# Patient Record
Sex: Female | Born: 1960 | Race: White | Hispanic: No | Marital: Single | Smoking: Former smoker
Health system: Southern US, Community
[De-identification: ages and names within clinical notes are randomized; demographics above are authoritative.]

## PROBLEM LIST (undated history)

## (undated) DIAGNOSIS — F329 Major depressive disorder, single episode, unspecified: Secondary | ICD-10-CM

## (undated) DIAGNOSIS — J45909 Unspecified asthma, uncomplicated: Secondary | ICD-10-CM

## (undated) DIAGNOSIS — F419 Anxiety disorder, unspecified: Secondary | ICD-10-CM

## (undated) DIAGNOSIS — I7789 Other specified disorders of arteries and arterioles: Secondary | ICD-10-CM

## (undated) DIAGNOSIS — M1990 Unspecified osteoarthritis, unspecified site: Secondary | ICD-10-CM

## (undated) DIAGNOSIS — J449 Chronic obstructive pulmonary disease, unspecified: Secondary | ICD-10-CM

## (undated) DIAGNOSIS — K219 Gastro-esophageal reflux disease without esophagitis: Secondary | ICD-10-CM

## (undated) DIAGNOSIS — F32A Depression, unspecified: Secondary | ICD-10-CM

## (undated) HISTORY — DX: Major depressive disorder, single episode, unspecified: F32.9

## (undated) HISTORY — DX: Other specified disorders of arteries and arterioles: I77.89

## (undated) HISTORY — DX: Anxiety disorder, unspecified: F41.9

## (undated) HISTORY — DX: Unspecified osteoarthritis, unspecified site: M19.90

## (undated) HISTORY — DX: Depression, unspecified: F32.A

## (undated) HISTORY — DX: Chronic obstructive pulmonary disease, unspecified: J44.9

## (undated) HISTORY — DX: Gastro-esophageal reflux disease without esophagitis: K21.9

## (undated) HISTORY — PX: ABDOMINAL HYSTERECTOMY: SHX81

## (undated) HISTORY — DX: Unspecified asthma, uncomplicated: J45.909

## (undated) HISTORY — PX: TONSILLECTOMY AND ADENOIDECTOMY: SHX28

## (undated) NOTE — *Deleted (*Deleted)
Manual Therapy  STM to L sided thoracic to lumbar paraspinals: PT felt tightness and trigger points throughout lumbar and thoracic L sided paraspinals, decreased since last session. Pt responded very well to manual therapy and demonstrated improvements in pain and overall mobility.

---

## 2009-03-25 ENCOUNTER — Ambulatory Visit: Payer: Self-pay | Admitting: Family Medicine

## 2009-06-27 ENCOUNTER — Ambulatory Visit: Payer: Self-pay | Admitting: Family Medicine

## 2010-12-01 IMAGING — CR DG CHEST 2V
1 series · 3 of 3 positions shown · non-contrast
Comparison: none

REASON FOR EXAM: acute bronchitis
COMMENTS:

PROCEDURE:     DXR - DXR CHEST PA (OR AP) AND LATERAL  - June 27, 2009  [DATE]
RESULT:     Comparison: None

[Series 1: view not recorded · 0.17mm/px · 3 of 3 slices shown]
[im 1/3]
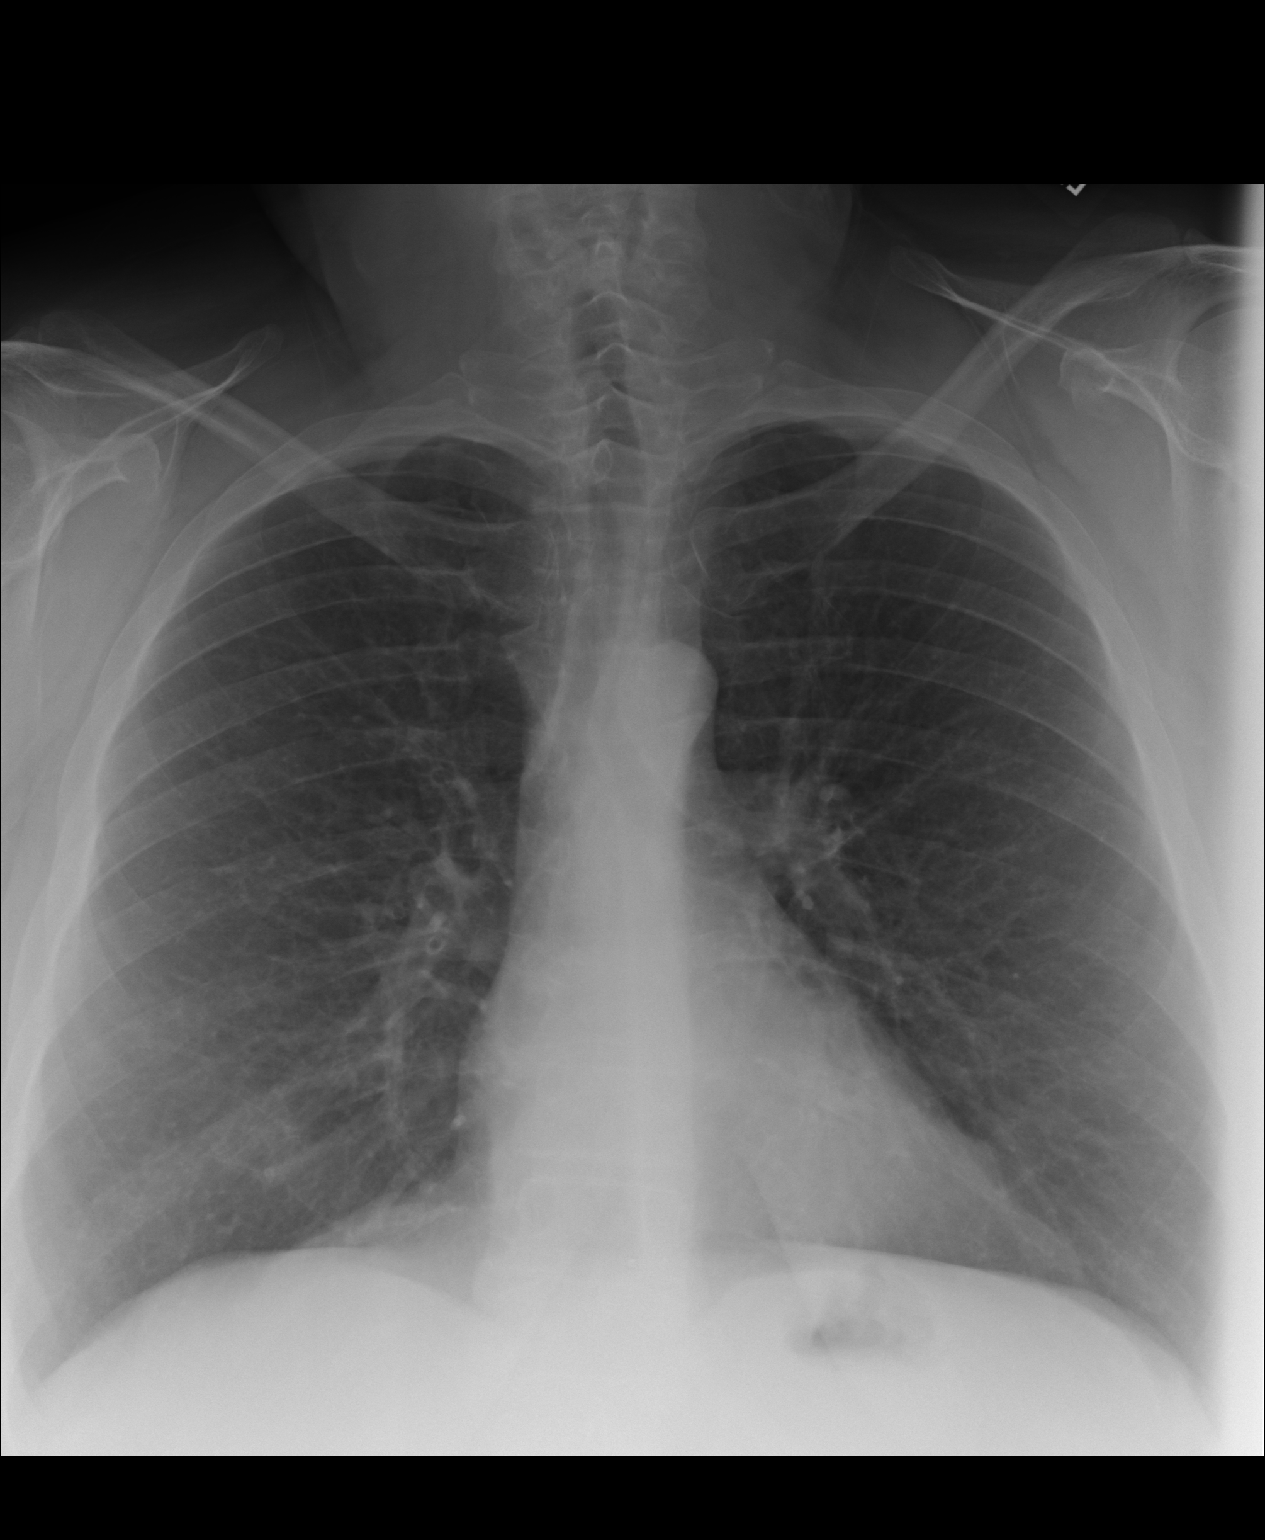
[im 2/3]
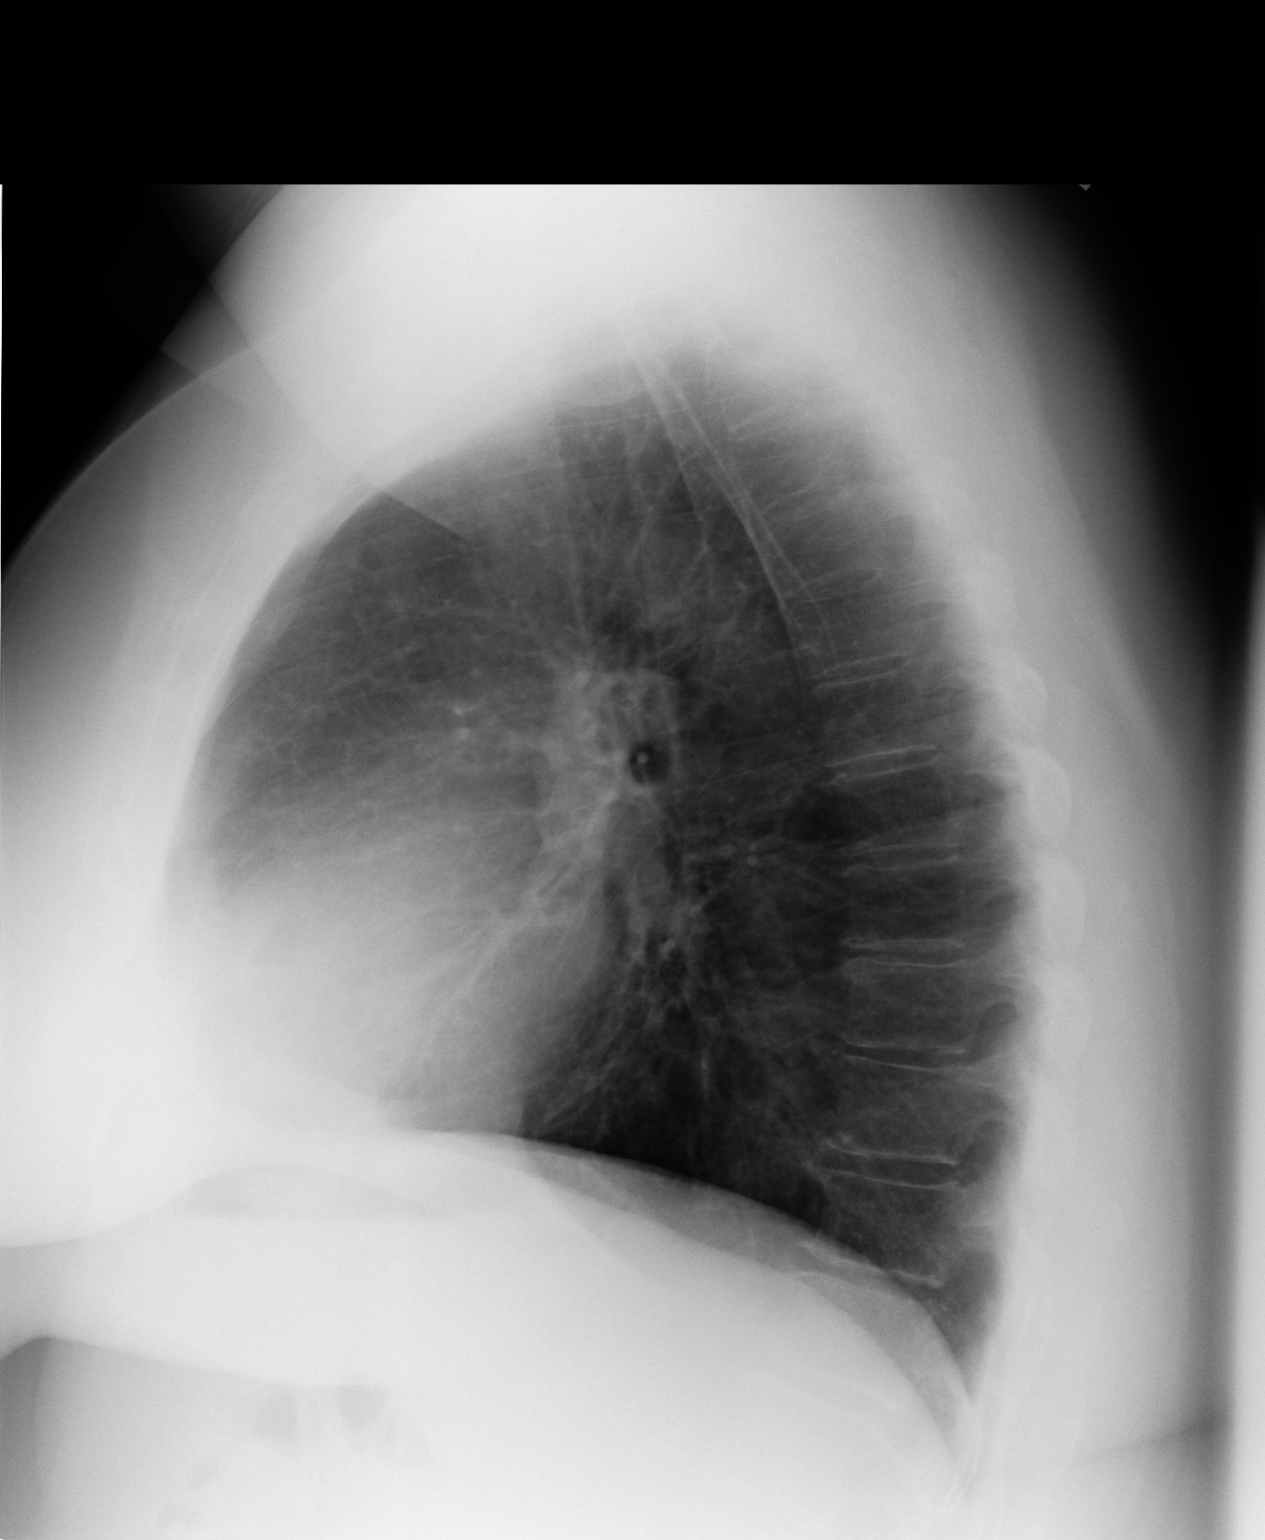
[im 3/3]
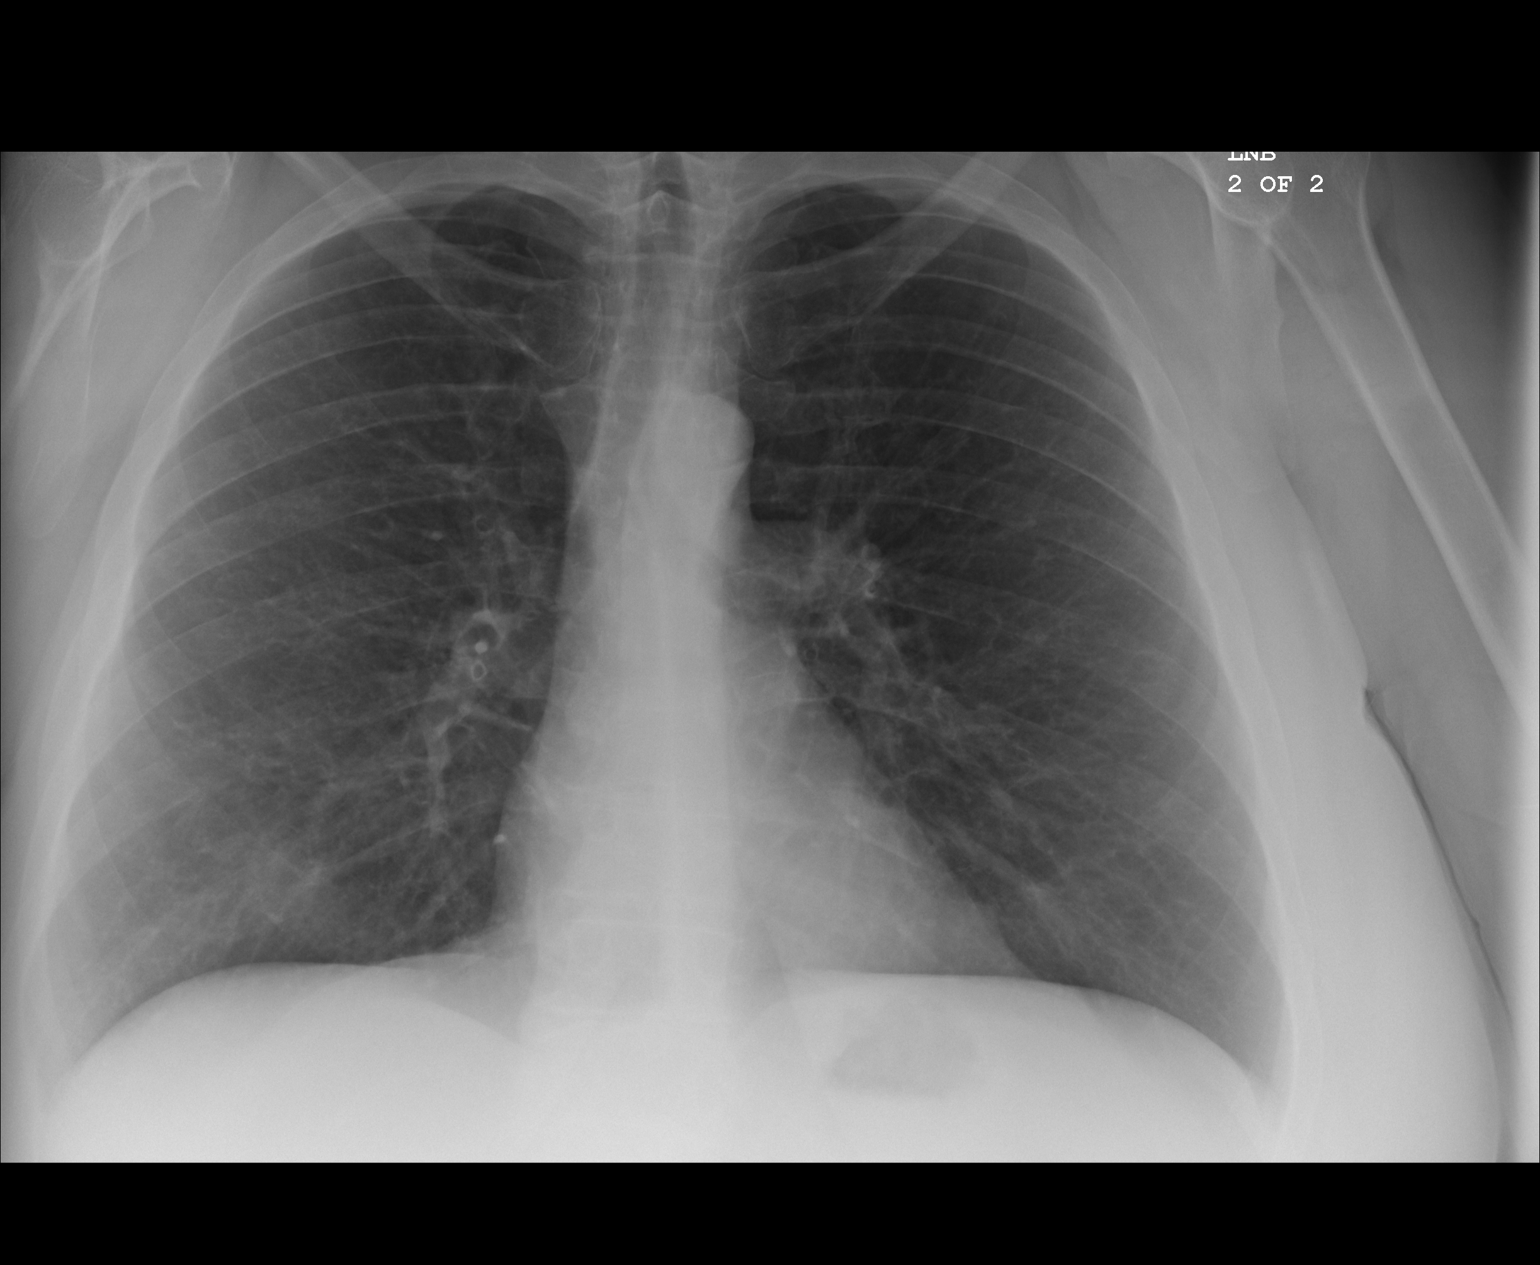

[3 of 3 positions shown; findings below may reference images not displayed]

FINDINGS: PA and lateral chest radiographs are provided. There is no focal parenchymal
opacity, pleural effusion, or pneumothorax. The heart and mediastinum are
unremarkable. The osseous structures are unremarkable.
IMPRESSION: No acute disease of the chest.

## 2012-09-04 ENCOUNTER — Emergency Department: Payer: Self-pay | Admitting: Emergency Medicine

## 2012-10-10 ENCOUNTER — Emergency Department: Payer: Self-pay

## 2012-11-18 DIAGNOSIS — M25519 Pain in unspecified shoulder: Secondary | ICD-10-CM | POA: Insufficient documentation

## 2013-06-05 DIAGNOSIS — J45909 Unspecified asthma, uncomplicated: Secondary | ICD-10-CM | POA: Insufficient documentation

## 2013-09-02 ENCOUNTER — Emergency Department: Payer: Self-pay | Admitting: Emergency Medicine

## 2014-03-09 HISTORY — PX: JOINT REPLACEMENT: SHX530

## 2014-03-29 DIAGNOSIS — F17209 Nicotine dependence, unspecified, with unspecified nicotine-induced disorders: Secondary | ICD-10-CM | POA: Insufficient documentation

## 2014-03-29 DIAGNOSIS — F17201 Nicotine dependence, unspecified, in remission: Secondary | ICD-10-CM | POA: Insufficient documentation

## 2014-03-29 DIAGNOSIS — Z720 Tobacco use: Secondary | ICD-10-CM | POA: Insufficient documentation

## 2014-03-29 DIAGNOSIS — J449 Chronic obstructive pulmonary disease, unspecified: Secondary | ICD-10-CM | POA: Insufficient documentation

## 2014-08-21 LAB — CBC AND DIFFERENTIAL
HCT: 43 % (ref 36–46)
Hemoglobin: 14.5 g/dL (ref 12.0–16.0)
Neutrophils Absolute: 4 /uL
Platelets: 198 10*3/uL (ref 150–399)
WBC: 8 10^3/mL

## 2014-08-21 LAB — TSH: TSH: 1.16 u[IU]/mL (ref 0.41–5.90)

## 2014-10-30 DIAGNOSIS — E785 Hyperlipidemia, unspecified: Secondary | ICD-10-CM | POA: Insufficient documentation

## 2014-12-20 DIAGNOSIS — M1990 Unspecified osteoarthritis, unspecified site: Secondary | ICD-10-CM | POA: Insufficient documentation

## 2014-12-20 DIAGNOSIS — M159 Polyosteoarthritis, unspecified: Secondary | ICD-10-CM | POA: Insufficient documentation

## 2014-12-20 LAB — BASIC METABOLIC PANEL
BUN: 11 mg/dL (ref 4–21)
Creatinine: 0.9 mg/dL (ref 0.5–1.1)
GLUCOSE: 93 mg/dL
POTASSIUM: 4.9 mmol/L (ref 3.4–5.3)
SODIUM: 143 mmol/L (ref 137–147)

## 2014-12-20 LAB — HEMOGLOBIN A1C: HEMOGLOBIN A1C: 5.5

## 2014-12-20 LAB — HEPATIC FUNCTION PANEL
ALT: 12 U/L (ref 7–35)
AST: 14 U/L (ref 13–35)
Alkaline Phosphatase: 96 U/L (ref 25–125)
BILIRUBIN DIRECT: 0.07 mg/dL (ref 0.01–0.4)
BILIRUBIN, TOTAL: 0.2 mg/dL

## 2015-03-27 ENCOUNTER — Ambulatory Visit: Payer: Self-pay | Admitting: Internal Medicine

## 2015-04-18 ENCOUNTER — Other Ambulatory Visit: Payer: Self-pay

## 2015-04-24 ENCOUNTER — Ambulatory Visit: Payer: Self-pay

## 2015-04-24 LAB — LIPID PANEL
Cholesterol: 246 mg/dL — AB (ref 0–200)
HDL: 44 mg/dL (ref 35–70)
LDL Cholesterol: 161 mg/dL
Triglycerides: 206 mg/dL — AB (ref 40–160)

## 2015-04-25 ENCOUNTER — Ambulatory Visit: Payer: Self-pay

## 2015-04-25 DIAGNOSIS — K219 Gastro-esophageal reflux disease without esophagitis: Secondary | ICD-10-CM | POA: Insufficient documentation

## 2015-05-02 ENCOUNTER — Other Ambulatory Visit: Payer: Self-pay | Admitting: Urology

## 2015-05-02 DIAGNOSIS — R1011 Right upper quadrant pain: Secondary | ICD-10-CM

## 2015-05-08 ENCOUNTER — Ambulatory Visit: Payer: Self-pay

## 2015-05-30 ENCOUNTER — Ambulatory Visit: Payer: Self-pay

## 2015-08-22 ENCOUNTER — Ambulatory Visit: Payer: Self-pay

## 2015-08-22 DIAGNOSIS — M5137 Other intervertebral disc degeneration, lumbosacral region: Secondary | ICD-10-CM | POA: Insufficient documentation

## 2015-08-22 DIAGNOSIS — M5136 Other intervertebral disc degeneration, lumbar region: Secondary | ICD-10-CM | POA: Insufficient documentation

## 2015-08-22 DIAGNOSIS — E669 Obesity, unspecified: Secondary | ICD-10-CM | POA: Insufficient documentation

## 2015-08-22 DIAGNOSIS — M51379 Other intervertebral disc degeneration, lumbosacral region without mention of lumbar back pain or lower extremity pain: Secondary | ICD-10-CM | POA: Insufficient documentation

## 2015-10-10 ENCOUNTER — Ambulatory Visit: Payer: Self-pay | Admitting: Ophthalmology

## 2015-10-15 ENCOUNTER — Ambulatory Visit: Payer: Self-pay

## 2015-10-22 ENCOUNTER — Ambulatory Visit: Payer: Self-pay

## 2015-11-28 ENCOUNTER — Ambulatory Visit: Payer: Self-pay

## 2016-03-03 DIAGNOSIS — M1990 Unspecified osteoarthritis, unspecified site: Secondary | ICD-10-CM

## 2016-03-03 DIAGNOSIS — M5136 Other intervertebral disc degeneration, lumbar region: Secondary | ICD-10-CM

## 2016-03-03 DIAGNOSIS — E785 Hyperlipidemia, unspecified: Secondary | ICD-10-CM

## 2016-03-03 DIAGNOSIS — E669 Obesity, unspecified: Secondary | ICD-10-CM

## 2016-03-03 DIAGNOSIS — K219 Gastro-esophageal reflux disease without esophagitis: Secondary | ICD-10-CM

## 2016-05-19 ENCOUNTER — Other Ambulatory Visit: Payer: Self-pay | Admitting: Internal Medicine

## 2016-10-15 ENCOUNTER — Other Ambulatory Visit: Payer: Self-pay | Admitting: Internal Medicine

## 2016-10-20 ENCOUNTER — Other Ambulatory Visit: Payer: Self-pay | Admitting: Internal Medicine

## 2016-10-22 ENCOUNTER — Ambulatory Visit: Payer: Self-pay | Admitting: Ophthalmology

## 2016-11-19 ENCOUNTER — Other Ambulatory Visit: Payer: Self-pay | Admitting: Internal Medicine

## 2016-12-01 ENCOUNTER — Other Ambulatory Visit: Payer: Self-pay | Admitting: Internal Medicine

## 2016-12-10 ENCOUNTER — Encounter (INDEPENDENT_AMBULATORY_CARE_PROVIDER_SITE_OTHER): Payer: Self-pay

## 2016-12-10 ENCOUNTER — Ambulatory Visit: Payer: Self-pay | Admitting: Ophthalmology

## 2016-12-17 ENCOUNTER — Other Ambulatory Visit: Payer: Self-pay | Admitting: Urology

## 2016-12-17 NOTE — Telephone Encounter (Signed)
Received PAP application from [facility] for Advair placed for provider to sign. 

## 2016-12-17 NOTE — Telephone Encounter (Signed)
Placed signed application/script in [facility] folder for pickup. 

## 2017-01-09 ENCOUNTER — Encounter: Payer: Self-pay | Admitting: Emergency Medicine

## 2017-01-09 ENCOUNTER — Emergency Department
Admission: EM | Admit: 2017-01-09 | Discharge: 2017-01-09 | Disposition: A | Discharge status: Home / Self Care | Payer: Medicare Other | Attending: Emergency Medicine | Admitting: Emergency Medicine

## 2017-01-09 ENCOUNTER — Emergency Department: Payer: Medicare Other

## 2017-01-09 DIAGNOSIS — J449 Chronic obstructive pulmonary disease, unspecified: Secondary | ICD-10-CM | POA: Insufficient documentation

## 2017-01-09 DIAGNOSIS — J209 Acute bronchitis, unspecified: Secondary | ICD-10-CM | POA: Insufficient documentation

## 2017-01-09 DIAGNOSIS — F17210 Nicotine dependence, cigarettes, uncomplicated: Secondary | ICD-10-CM | POA: Insufficient documentation

## 2017-01-09 DIAGNOSIS — J45909 Unspecified asthma, uncomplicated: Secondary | ICD-10-CM | POA: Insufficient documentation

## 2017-01-09 DIAGNOSIS — Z79899 Other long term (current) drug therapy: Secondary | ICD-10-CM | POA: Insufficient documentation

## 2017-01-09 MED ORDER — IPRATROPIUM-ALBUTEROL 0.5-2.5 (3) MG/3ML IN SOLN
3.0000 mL | Freq: Once | RESPIRATORY_TRACT | Status: AC
  Administered 2017-01-09: 3 mL via RESPIRATORY_TRACT
  Filled 2017-01-09: qty 3

## 2017-01-09 MED ORDER — AZITHROMYCIN 250 MG PO TABS: ORAL_TABLET | ORAL | 0 refills | Status: DC

## 2017-01-09 MED ORDER — ALBUTEROL SULFATE HFA 108 (90 BASE) MCG/ACT IN AERS: 2.0000 | INHALATION_SPRAY | Freq: Four times a day (QID) | RESPIRATORY_TRACT | 2 refills | Status: DC | PRN

## 2017-01-09 MED ORDER — MAGIC MOUTHWASH W/LIDOCAINE: 10.0000 mL | Freq: Three times a day (TID) | ORAL | 0 refills | Status: DC

## 2017-01-09 MED ORDER — PREDNISONE 10 MG (21) PO TBPK: ORAL_TABLET | Freq: Every day | ORAL | 0 refills | Status: AC

## 2017-01-09 NOTE — ED Provider Notes (Signed)
EKG is reviewed and interpreted by me at 1555 Heart rate is 90 QRS is 70 QTc is 455 Normal sinus rhythm without evidence of ectopy or ischemia   Sharyn CreamerMark Quale, MD 01/09/17 1557

## 2017-01-09 NOTE — ED Notes (Signed)
See triage note   States she developed cough and low grade fever about 4-5 days.  States she has used inhalers and SVN treatments with min relief  conts to have cough  States cough is now prod  [employer]

## 2017-01-09 NOTE — ED Triage Notes (Signed)
Cough and body aches x 5 days.

## 2017-01-09 NOTE — ED Notes (Signed)

## 2017-01-09 NOTE — ED Provider Notes (Signed)
[facility] Emergency Department Provider Note  ____________________________________________  Time seen: Approximately 3:10 PM  I have reviewed the triage vital signs and the nursing notes.   HISTORY  Chief Complaint Cough    HPI Kelsey Rose is a 56 y.o. female with a history of asthma presents to the emergency department with productive cough, fatigue, purulent sputum production,  and myalgias for the past 5 days. Patient has not evaluated her temperature. Patient also states that she has chest tightness and headache. She denies rhinorrhea and nasal congestion. She is tolerating fluids and food by mouth. She denies chest pain, abdominal pain, nausea and vomiting. Patient states that she has recently joined the [facility] and looks forward to swimming. Patient has been using more albuterol than usual. No other alleviating measures about attempted.   Past Medical History:  Diagnosis Date  . Anxiety   . Arthritis   . Asthma   . COPD (chronic obstructive pulmonary disease) (HCC)   . Depression   . GERD (gastroesophageal reflux disease)     Patient Active Problem List   Diagnosis Date Noted  . Obesity 08/22/2015  . Lumbar degenerative disc disease 08/22/2015  . GERD (gastroesophageal reflux disease) 04/25/2015  . Arthritis 12/20/2014  . Hyperlipemia 10/30/2014  . Chronic obstructive pulmonary disease (HCC) 03/29/2014  . Current tobacco use 03/29/2014    Past Surgical History:  Procedure Laterality Date  . ABDOMINAL HYSTERECTOMY  56 yrs old  . JOINT REPLACEMENT Right  May 2015   Shoulder replacement  . TONSILLECTOMY AND ADENOIDECTOMY  Age 29    Prior to Admission medications   Medication Sig Start Date End Date Taking? Authorizing Provider  albuterol (PROVENTIL HFA;VENTOLIN HFA) 108 (90 Base) MCG/ACT inhaler Inhale 2 puffs into the lungs every 6 (six) hours as needed for wheezing or shortness of breath. 01/09/17   Orvil Feil, PA-C  azithromycin  (ZITHROMAX Z-PAK) 250 MG tablet Take 2 tablets by mouth on day 1. Take 1 tablet by mouth daily for days 2-5. 01/09/17   Orvil Feil, PA-C  celecoxib (CELEBREX) 200 MG capsule Take 200 mg by mouth 2 (two) times daily. 12/20/14   Historical Provider, MD  cyclobenzaprine (FLEXERIL) 5 MG tablet Take 5 mg by mouth 2 (two) times daily. 04/25/15   Historical Provider, MD  Fluticasone-Salmeterol (ADVAIR DISKUS) 250-50 MCG/DOSE AEPB Inhale 1 puff into the lungs 2 (two) times daily.    Historical Provider, MD  nicotine (NICODERM CQ - DOSED IN MG/24 HOURS) 21 mg/24hr patch Place 21 mg onto the skin daily.    Historical Provider, MD  omeprazole (PRILOSEC) 20 MG capsule TAKE ONE CAPSULE BY MOUTH EVERY DAY FOR GERD 05/25/16   Carollee Herter A McGowan, PA-C  predniSONE (STERAPRED UNI-PAK 21 TAB) 10 MG (21) TBPK tablet Take by mouth daily. Take 6 tablets the first day, take 5 tablets the second day, take 4 tablets the third day, take 3 tablets the fourth day, take 2 tablets the fifth day, take 1 tablet the sixth day. 01/09/17 01/15/17  Orvil Feil, PA-C    Allergies Sulfa antibiotics and Penicillins  Family History  Problem Relation Age of Onset  . Cirrhosis Father   . Depression Sister   . COPD Sister   . Diabetes Daughter   . Depression Sister   . COPD Sister   . Gallstones Mother   . Dementia Mother   . Osteoarthritis Mother   . Heart attack Brother     Social History Social History  Substance Use Topics  . Smoking status: Current Every Day Smoker    Packs/day: 0.50    Types: Cigarettes  . Smokeless tobacco: Not on file  . Alcohol use No     Review of Systems  Constitutional: No fever/chills Eyes: No visual changes. No discharge ENT: No upper respiratory complaints. Cardiovascular: no chest pain. Respiratory: Patient has productive cough. Gastrointestinal: No abdominal pain.  No nausea, no vomiting.  No diarrhea.  No constipation. Genitourinary: Negative for dysuria. No  hematuria Musculoskeletal: Patient has myalgias Skin: Negative for rash, abrasions, lacerations, ecchymosis. Neurological: Patient has headache, no focal weakness or numbness.   ____________________________________________   PHYSICAL EXAM:  VITAL SIGNS: ED Triage Vitals  Enc Vitals Group     BP 01/09/17 1411 120/73     Pulse Rate 01/09/17 1411 94     Resp 01/09/17 1411 20     Temp 01/09/17 1411 99.1 F (37.3 C)     Temp Source 01/09/17 1411 Oral     SpO2 01/09/17 1411 100 %     Weight 01/09/17 1413 226 lb (102.5 kg)     Height 01/09/17 1413 5\' 6"  (1.676 m)     Head Circumference --      Peak Flow --      Pain Score 01/09/17 1413 7     Pain Loc --      Pain Edu? --      Excl. in GC? --      Constitutional: Alert and oriented. She is lying on her side when I entered the room. She was easily arousable Eyes: Palpebral and bulbar conjunctiva are nonerythematous bilaterally. PERRL. EOMI. No scleral icterus bilaterally. Head: Atraumatic. ENT:      Ears: Tympanic membranes are injected bilaterally but without effusion or purulent exudate. Bony landmarks are visualized bilaterally.       Nose: Skin overlying nares is without erythema. Nasal turbinates are non-erythematous. Nasal septum is midline.      Mouth/Throat: Mucous membranes are moist. Posterior pharynx is nonerythematous. No tonsillar exudate, hypertrophy or petechiae visualized. Uvula is midline. Neck: Full range of motion. No pain with neck flexion. Hematological/Lymphatic/Immunilogical: No cervical lymphadenopathy.  Cardiovascular:  Normal rate, regular rhythm. Normal S1 and S2. No murmurs, gallops or rubs auscultated.  Respiratory: Patient coughs throughout exam. No retractions or presence of deformity. Thoracic expansion is symmetric with unaccentuated tactile fremitus. Resonant and symmetric percussion tones bilaterally. Patient had diffuse wheezing to auscultation. Wheezing improved to auscultation after DuoNeb  treatment. Neurologic:  Normal for age. No gross focal neurologic deficits are appreciated.  Skin:  Skin is warm, dry and intact. No rash noted. No clubbing or cyanosis of the digits visualized.  Psychiatric: Mood and affect are normal for age. Speech and behavior are normal.  ___________________________________________   LABS (all labs ordered are listed, but only abnormal results are displayed)  Labs Reviewed - No data to display ____________________________________________  EKG  Normal sinus rhythm without ST segment elevation  ____________________________________________  RADIOLOGY I, Orvil Feil, personally viewed and evaluated these images (plain radiographs) as part of my medical decision making, as well as reviewing the written report by the radiologist.    Dg Chest 2 View  Result Date: 01/09/2017 CLINICAL DATA:  Smoker with cough all week, weakness. Known COPD EXAM: CHEST  2 VIEW COMPARISON:  Chest x-ray dated 06/27/2009. FINDINGS: Heart size and mediastinal contours are normal. Lungs at least mildly hyperexpanded suggesting COPD. Suspect associated chronic bronchitic changes centrally. Lungs otherwise clear. No pleural  effusion or pneumothorax seen. Mild degenerative spurring noted throughout the slightly kyphotic thoracic spine. Status post right shoulder arthroplasty. No acute or suspicious osseous finding. IMPRESSION: 1. No active cardiopulmonary disease. No evidence of pneumonia or pulmonary edema. 2. Probable COPD with associated chronic bronchitic changes. Electronically Signed   By: Bary RichardStan  Maynard M.D.   On: 01/09/2017 16:04    ____________________________________________    PROCEDURES  Procedure(s) performed:    Procedures    Medications  ipratropium-albuterol (DUONEB) 0.5-2.5 (3) MG/3ML nebulizer solution 3 mL (3 mLs Nebulization Given 01/09/17 1553)     ____________________________________________   INITIAL IMPRESSION / ASSESSMENT AND PLAN / ED  COURSE  Pertinent labs & imaging results that were available during my care of the patient were reviewed by me and considered in my medical decision making (see chart for details).  Review of the  [place] was performed in accordance of the [place] prior to dispensing any controlled drugs.     Assessment and Plan Bronchitis  Patient presents to the emergency department with productive cough, chest tightness and myalgias for the past 5 days. Patient has also had fatigue and purulent sputum production. EKG conducted in the emergency department reveals normal sinus rhythm without ST segment elevation. DG chest indicates findings consistent with bronchitis. On physical exam, patient had diffuse wheezing which improved to auscultation after DuoNeb treatment. Patient was discharged with azithromycin and tapered prednisone. Patient was advised to follow-up with her primary care provider in one week. Physical exam vital signs are reassuring at this time. All patient questions were answered.  ____________________________________________  FINAL CLINICAL IMPRESSION(S) / ED DIAGNOSES  Final diagnoses:  Acute bronchitis, unspecified organism      NEW MEDICATIONS STARTED DURING THIS VISIT:  Discharge Medication List as of 01/09/2017  4:43 PM    START taking these medications   Details  azithromycin (ZITHROMAX Z-PAK) 250 MG tablet Take 2 tablets by mouth on day 1. Take 1 tablet by mouth daily for days 2-5., Print            This chart was dictated using voice recognition software/Dragon. Despite best efforts to proofread, errors can occur which can change the meaning. Any change was purely unintentional.    Orvil FeilJaclyn M Woods, PA-C 01/09/17 1756    Merrily BrittleNeil Rifenbark, MD 01/09/17 [phone]

## 2017-01-10 ENCOUNTER — Telehealth: Payer: Self-pay | Admitting: Emergency Medicine

## 2017-01-18 ENCOUNTER — Telehealth: Payer: Self-pay | Admitting: Pharmacy Technician

## 2017-01-18 NOTE — Telephone Encounter (Signed)
Patient has active Medicare Part A & D.  Will be unable to provide medication assistance since patient has prescription coverage.  Sherilyn DacostaBetty J. Takyla Kuchera Care Manager [facility]

## 2017-12-09 ENCOUNTER — Ambulatory Visit: Payer: Self-pay | Admitting: Ophthalmology

## 2018-03-17 LAB — HM HEPATITIS C SCREENING LAB: HM Hepatitis Screen: NEGATIVE

## 2018-04-25 ENCOUNTER — Encounter: Payer: Self-pay | Admitting: Pharmacist

## 2018-06-02 LAB — HM COLONOSCOPY

## 2018-06-15 IMAGING — CR DG CHEST 2V
1 series · 2 of 2 positions shown · non-contrast
Comparison: Chest x-ray dated 06/27/2009.

CLINICAL DATA: Smoker with cough all week, weakness. Known COPD

EXAM:
CHEST  2 VIEW

[Series 1: dg chest 2 view · 0.14mm/px · 2 of 2 slices shown]
[im 1/2]
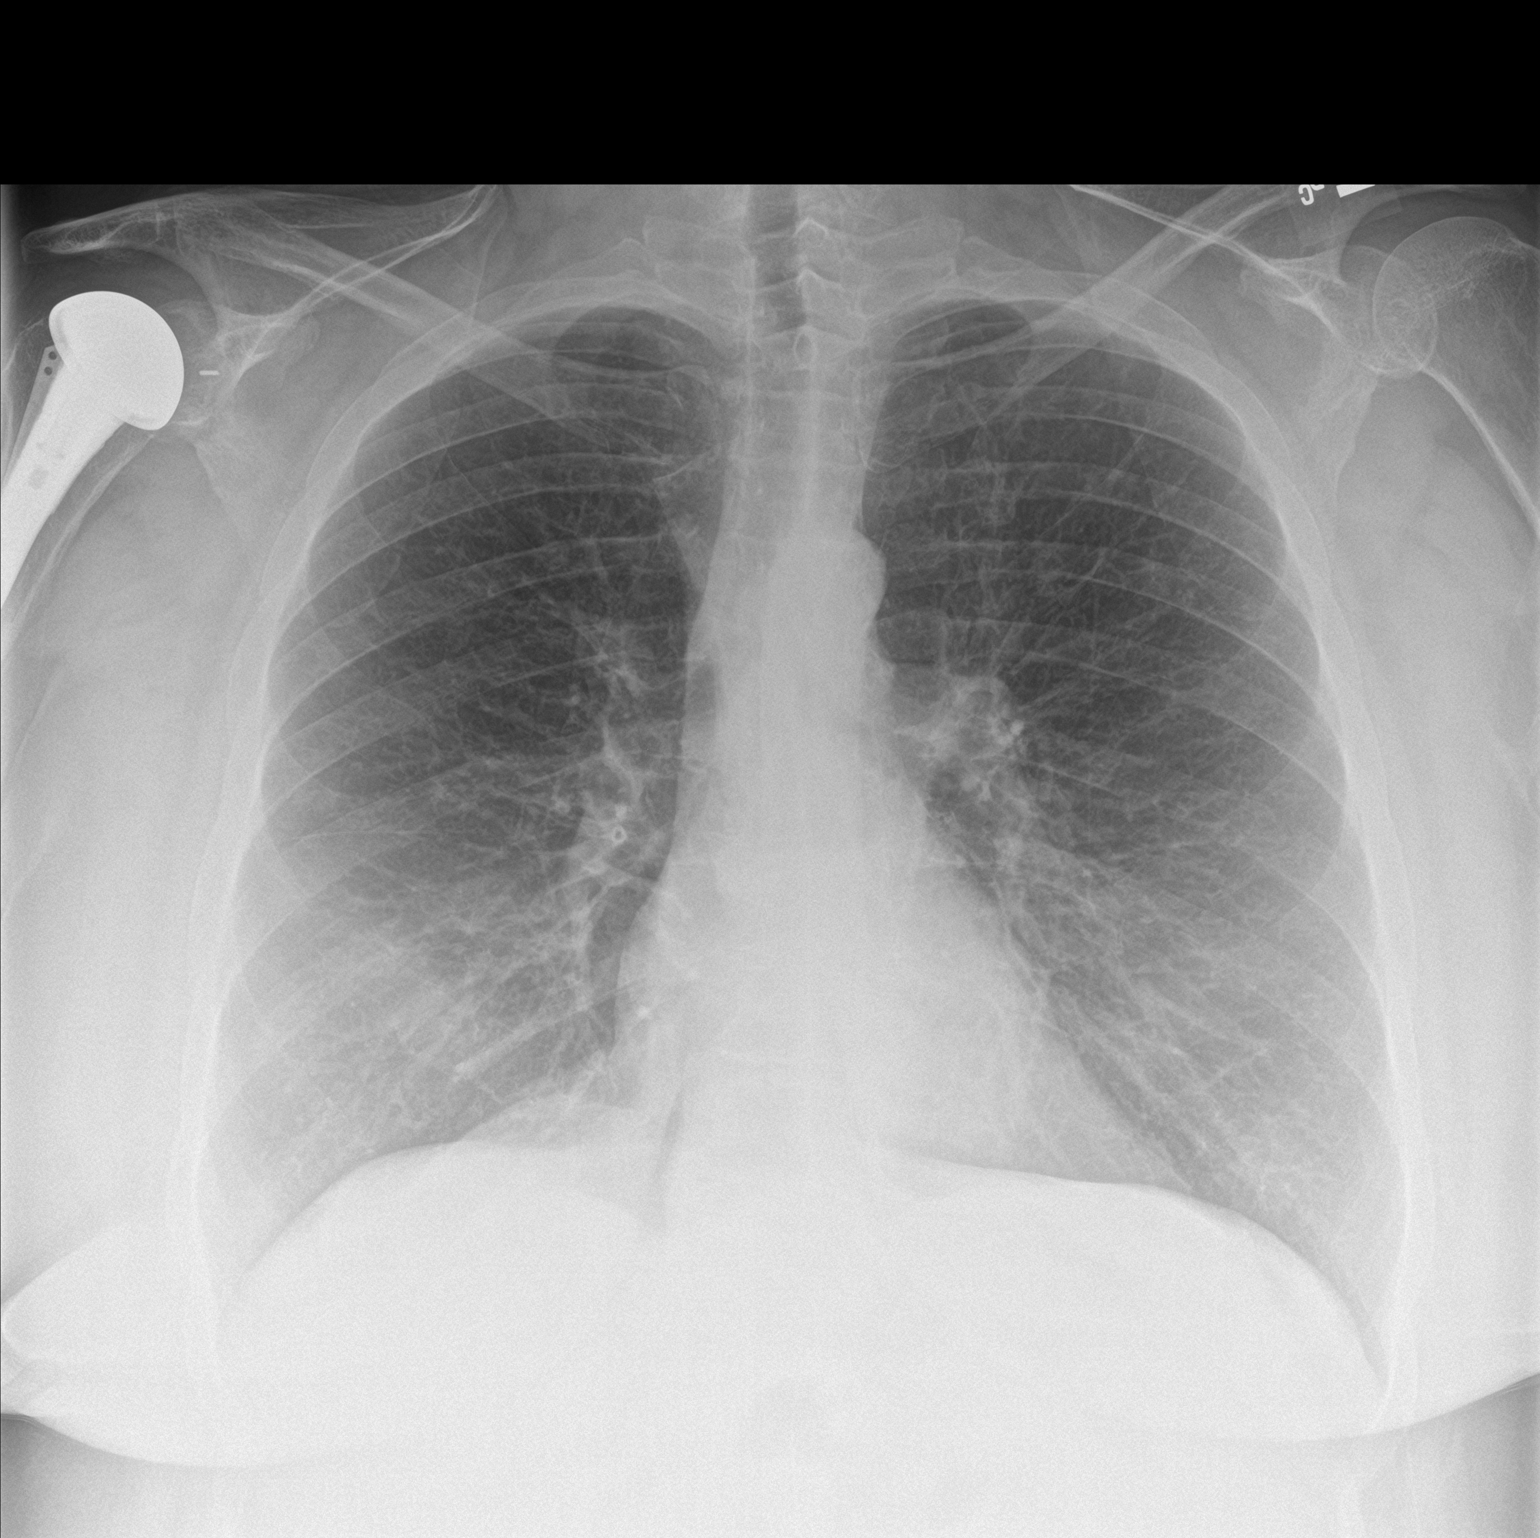
[im 2/2]
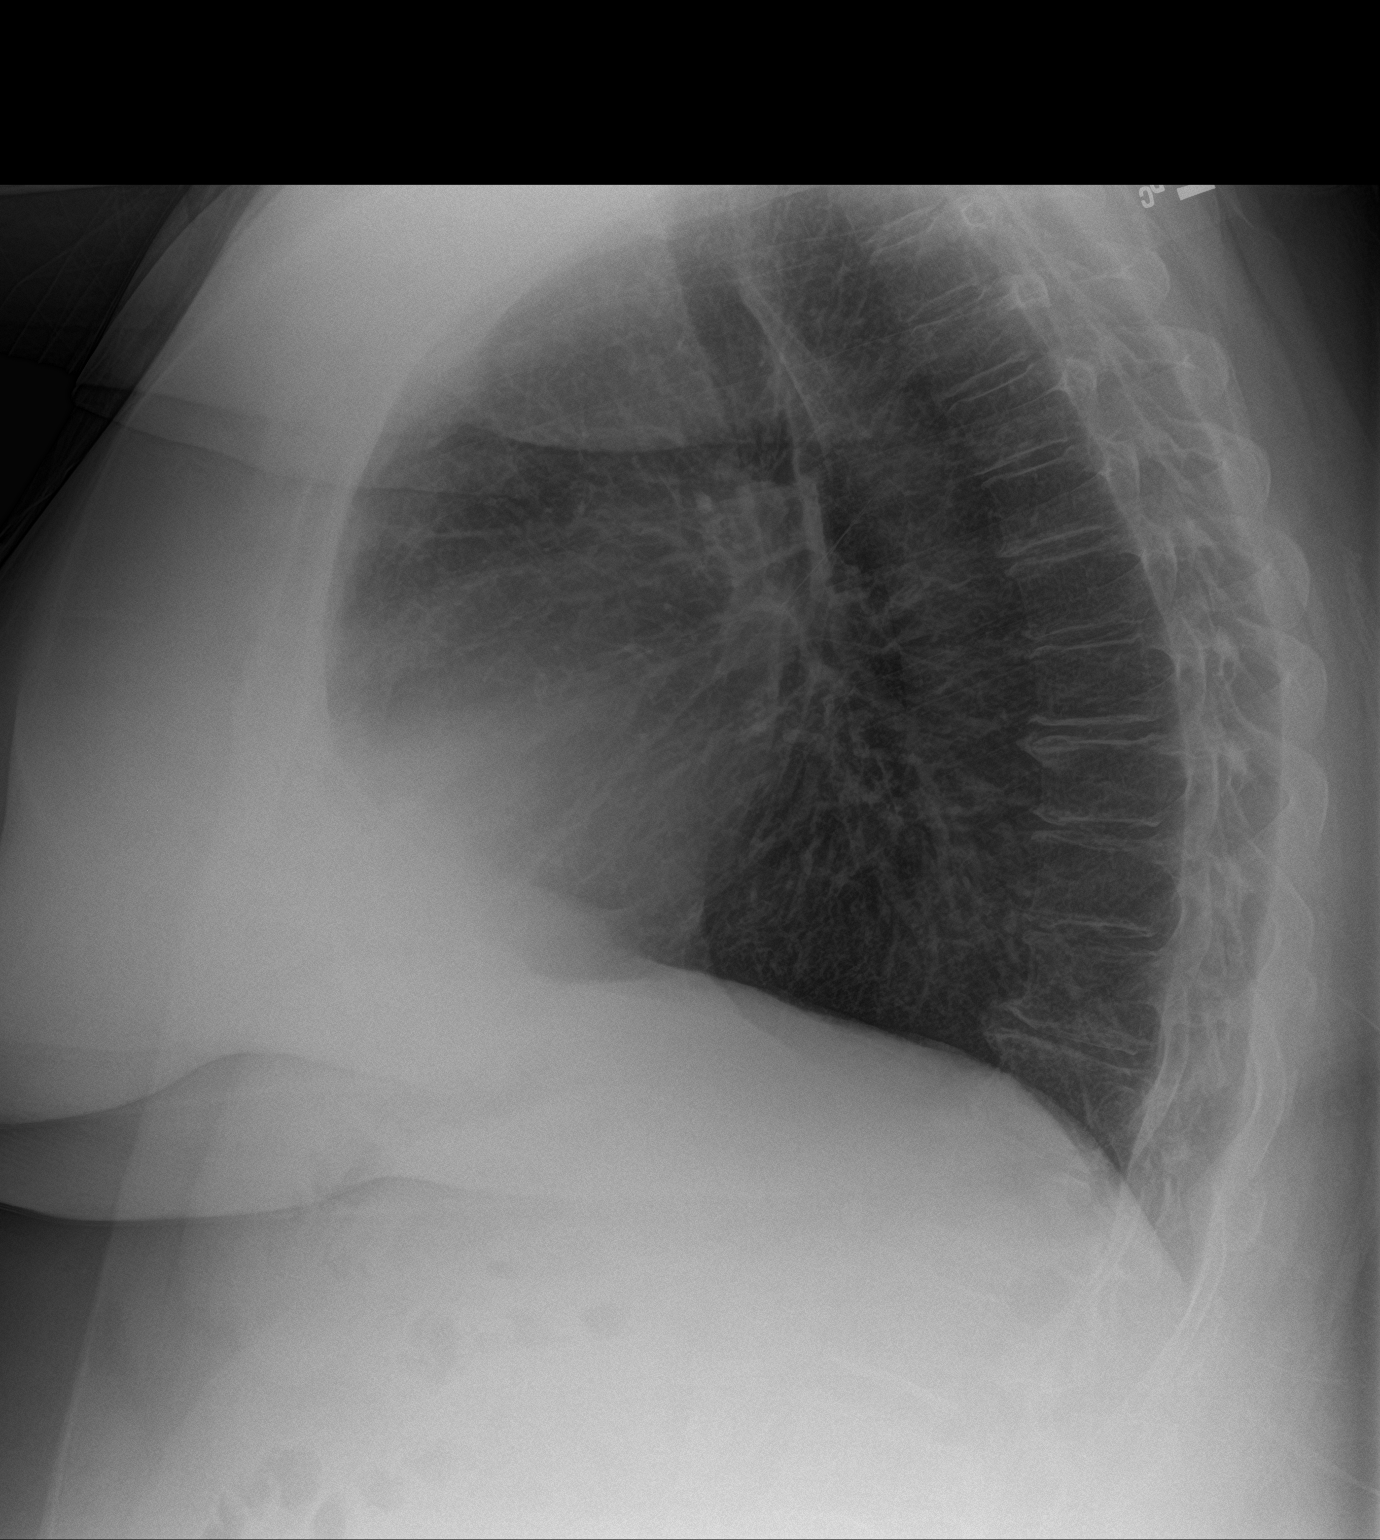

[2 of 2 positions shown; findings below may reference images not displayed]

FINDINGS: Heart size and mediastinal contours are normal. Lungs at least
mildly hyperexpanded suggesting COPD. Suspect associated chronic
bronchitic changes centrally. Lungs otherwise clear. No pleural
effusion or pneumothorax seen.

Mild degenerative spurring noted throughout the slightly kyphotic
thoracic spine. Status post right shoulder arthroplasty. No acute or
suspicious osseous finding.
IMPRESSION: 1. No active cardiopulmonary disease. No evidence of pneumonia or
pulmonary edema.
2. Probable COPD with associated chronic bronchitic changes.

## 2019-04-17 LAB — HM PAP SMEAR: HM Pap smear: NEGATIVE

## 2019-04-17 LAB — RESULTS CONSOLE HPV: CHL HPV: NEGATIVE

## 2020-09-05 ENCOUNTER — Ambulatory Visit: Payer: Medicare Other | Admitting: Physical Therapy

## 2020-09-10 ENCOUNTER — Ambulatory Visit: Payer: Medicare Other | Admitting: Physical Therapy

## 2020-09-11 ENCOUNTER — Ambulatory Visit: Payer: Medicare Other | Admitting: Physical Therapy

## 2020-09-12 ENCOUNTER — Encounter: Payer: Medicare Other | Admitting: Physical Therapy

## 2020-09-16 ENCOUNTER — Ambulatory Visit: Payer: Medicare Other | Admitting: Physical Therapy

## 2020-09-17 ENCOUNTER — Encounter: Payer: Medicare Other | Admitting: Physical Therapy

## 2020-09-18 ENCOUNTER — Other Ambulatory Visit: Payer: Self-pay

## 2020-09-18 ENCOUNTER — Ambulatory Visit: Payer: Medicare Other | Attending: Orthopedic Surgery | Admitting: Physical Therapy

## 2020-09-18 DIAGNOSIS — M545 Low back pain, unspecified: Secondary | ICD-10-CM | POA: Diagnosis not present

## 2020-09-18 DIAGNOSIS — R2689 Other abnormalities of gait and mobility: Secondary | ICD-10-CM | POA: Insufficient documentation

## 2020-09-18 DIAGNOSIS — M6281 Muscle weakness (generalized): Secondary | ICD-10-CM | POA: Diagnosis present

## 2020-09-18 DIAGNOSIS — G8929 Other chronic pain: Secondary | ICD-10-CM | POA: Insufficient documentation

## 2020-09-18 NOTE — Therapy (Signed)
[facility] [address] [place], [place], [address] Phone: [phone]   Fax:  [phone]  Physical Therapy Evaluation  Patient Details  Name: Kelsey Rose MRN: [id number] Date of Birth: 01/26/1961 Referring Provider (PT): Juanell Fairly, MD   Encounter Date: 09/18/2020   PT End of Session - 09/18/20 1450    Visit Number 1    Number of Visits 9    Date for PT Re-Evaluation 10/16/20    Authorization - Visit Number 1    Authorization - Number of Visits 10    PT Start Time 1350    PT Stop Time 1450    PT Time Calculation (min) 60 min    Activity Tolerance Patient tolerated treatment well    Behavior During Therapy [facility] for tasks assessed/performed           Past Medical History:  Diagnosis Date  . Anxiety   . Arthritis   . Asthma   . COPD (chronic obstructive pulmonary disease) (HCC)   . Depression   . GERD (gastroesophageal reflux disease)     Past Surgical History:  Procedure Laterality Date  . ABDOMINAL HYSTERECTOMY  59 yrs old  . JOINT REPLACEMENT Right  May 2015   Shoulder replacement  . TONSILLECTOMY AND ADENOIDECTOMY  Age 59    There were no vitals filed for this visit.   OBJECTIVE  Mental Status Patient is oriented to person, place and time.  Recent memory is intact.  Remote memory is intact.  Attention span and concentration are intact.  Expressive speech is intact.  Patient's fund of knowledge is within normal limits for educational level.  SENSATION: Pt denies any sensation changes.   MUSCULOSKELETAL: Tremor: None Bulk: Normal Tone: Normal No visible step-off along spinal column  Posture Lumbar lordosis: decreased    Palpation Increased pain with palpating L paraspinals, glute med, and glute max. Tenderness over L PSIS.   Strength (out of 5) R/L 4*/3+* Hip flexion 5/5 Hip ER 5/5* Hip IR 5/4+ Hip abduction 5/5 Hip adduction 5/5 Hip extension 5/5 Knee extension 5/5 Knee  flexion  *Indicates pain   AROM (degrees) R/L Lumbar forward flexion (65): WFL Lumbar extension (30): WFL Lumbar lateral flexion (25): WFL bilat Thoracic and Lumbar rotation (30 degrees):  WFL bilat Hip IR (0-45): WFL bilat Hip ER (0-45): WFL bilat Hip Flexion (0-125): WFL bilat Hip Abduction (0-40): WFL bilat *Indicates pain  Repeated Movements No centralization or peripheralization of symptoms with repeated lumbar extension or flexion. Pt able to demonstrate ability to complete prone pressup onto hands with no increase in back pain   Muscle Length Hamstrings: R: 65 degrees L: 50 degrees     Passive Accessory Intervertebral Motion (PAIVM) Reproduction of sx with L3-L5 CPAs grade 2-3, sx improved with L3 and L4 with more repetitions.    SPECIAL TESTS Lumbar Radiculopathy and Discogenic: Centralization and Peripheralization: no centralization/peripheralization with repeated motions SLR: Negative bilat  Hip: FABER: L: neg R: neg FADIR: L: positive R: neg Hip scour: L: positive. R: neg  SIJ:  Thigh Thrust: neg        Objective measurements completed on examination: See above findings.     See HEP     PT Long Term Goals - 09/18/20 1501      PT LONG TERM GOAL #1   Title Pt. will be able to walk for longer than 10 mins with pain no more than 2/10 in L low back/hip to improve ability to  complete community ambulation without pain.    Baseline 11/10: 5/10 pain L low back, 7/10 pain in L hip    Time 4    Period Weeks    Status New    Target Date 10/16/20      PT LONG TERM GOAL #2   Title Pt. will report ability to complete dishes standing at sink without needing to sit down to improve ability to complete ADLs without pain.    Baseline 11/10: Pt can only stand for approx 10-15 mins before pain increases (5/10 L low back, 7/10 L hip)    Time 4    Period Weeks    Status New    Target Date 10/16/20      PT LONG TERM GOAL #3   Title Pt. will improve bilat hip  flexion strength by at least half a muslce grade to improve pain free functional ability.    Baseline 11/10: R/L out of 5: hip flex: 4/3+    Time 4    Period Weeks    Status New    Target Date 10/16/20      PT LONG TERM GOAL #4   Title Pt. will improve FOTO to 50 to improve pain free functional mobility.    Baseline 11/10: 41    Time 4    Period Weeks    Status New    Target Date 10/16/20                  Plan - 09/18/20 1509    Clinical Impression Statement Pt. is a 59 y.o. woman with chronic L sided low back pain and L hip pain. Pt. demonstrates good LE strength, with weaknesses in bilat hip flexion (R:4/5, L: 3+/5) and L hip abd (4+/5). Pt. denies radicular sx, negative for SLR bilat. Pt. demonstrates full AROM of spine without pain, demonstrates hinge pattern at low lumbar levels with spinal ext. Pt. tender to palpation on L lumbar paraspinals, glute med, glute max, and over L PSIS. Pt. hypomobile with grade 2-3 CPAs provided to L3, L4, and L5. Pt. positive for FADDIR and hip scour on L, negative for FABER bilat. Pt. able to demonstrate full prone pressup, unable to maintain hips on table to complete. Pt. demonstrated decrease in L hamstring length compared to R, L: 50 deg, R: 65 deg. Pt. demonstrated bridges to complete at home with good form. Pt. will benefit from skilled PT to improve spinal mobiltiy and decrease pain to improve ability to complete pain free ADLs, improve community ambulation, and improve pain free functional mobility.    Personal Factors and Comorbidities Time since onset of injury/illness/exacerbation;Past/Current Experience;Fitness    Examination-Activity Limitations Squat;Stairs;Stand;Locomotion Level    Examination-Participation Restrictions Cleaning;Aaron Mose    Stability/Clinical Decision Making Stable/Uncomplicated    Clinical Decision Making Low    Rehab Potential Good    PT Frequency 2x / week    PT Duration 4 weeks    PT  Treatment/Interventions ADLs/Self Care Home Management;Cryotherapy;Electrical Stimulation;Iontophoresis 4mg /ml Dexamethasone;Moist Heat;Gait training;Stair training;Functional mobility training;Therapeutic activities;Therapeutic exercise;Balance training;Neuromuscular re-education;Patient/family education;Manual techniques;Dry needling    PT Next Visit Plan soft tissue, core progression, graded exercise plan    Consulted and Agree with Plan of Care Patient           Patient will benefit from skilled therapeutic intervention in order to improve the following deficits and impairments:  Decreased activity tolerance, Decreased endurance, Decreased mobility, Decreased range of motion, Decreased strength, Difficulty walking, Hypomobility, Increased fascial restricitons, Impaired  flexibility, Pain, Postural dysfunction, Improper body mechanics, Increased muscle spasms  Visit Diagnosis: Chronic left-sided low back pain without sciatica  Decreased mobility  Muscle weakness (generalized)     Problem List Patient Active Problem List   Diagnosis Date Noted  . Obesity 08/22/2015  . Lumbar degenerative disc disease 08/22/2015  . GERD (gastroesophageal reflux disease) 04/25/2015  . Arthritis 12/20/2014  . Hyperlipemia 10/30/2014  . Chronic obstructive pulmonary disease (HCC) 03/29/2014  . Current tobacco use 03/29/2014   Cammie Mcgee, PT, DPT # [id number] Sharyn Creamer, SPT 09/19/2020, 8:42 AM  [facility] [address] [place], [place], [address] Phone: [phone]   Fax:  [phone]  Name: Kelsey Rose MRN: [id number] Date of Birth: 1961/10/30

## 2020-09-19 ENCOUNTER — Encounter: Payer: Self-pay | Admitting: Physical Therapy

## 2020-09-19 ENCOUNTER — Encounter: Payer: Medicare Other | Admitting: Physical Therapy

## 2020-09-23 ENCOUNTER — Encounter: Payer: Medicare Other | Admitting: Physical Therapy

## 2020-09-24 ENCOUNTER — Ambulatory Visit: Payer: Medicare Other | Admitting: Physical Therapy

## 2020-09-24 ENCOUNTER — Encounter: Payer: Medicare Other | Admitting: Physical Therapy

## 2020-09-24 ENCOUNTER — Encounter: Payer: Self-pay | Admitting: Physical Therapy

## 2020-09-24 ENCOUNTER — Other Ambulatory Visit: Payer: Self-pay

## 2020-09-24 DIAGNOSIS — M6281 Muscle weakness (generalized): Secondary | ICD-10-CM

## 2020-09-24 DIAGNOSIS — M545 Low back pain, unspecified: Secondary | ICD-10-CM | POA: Diagnosis not present

## 2020-09-24 DIAGNOSIS — R2689 Other abnormalities of gait and mobility: Secondary | ICD-10-CM

## 2020-09-24 NOTE — Therapy (Signed)
[facility] [address] [place], [place], [address] Phone: [phone]   Fax:  [phone]  Physical Therapy Treatment  Patient Details  Name: Kelsey Rose MRN: [id number] Date of Birth: Aug 28, 1961 Referring Provider (PT): Juanell Fairly, MD   Encounter Date: 09/24/2020   PT End of Session - 09/24/20 1436    Visit Number 2    Number of Visits 9    Date for PT Re-Evaluation 10/16/20    Authorization - Visit Number 2    Authorization - Number of Visits 10    PT Start Time 1433    PT Stop Time 1511    PT Time Calculation (min) 38 min    Activity Tolerance Patient tolerated treatment well    Behavior During Therapy [facility] for tasks assessed/performed           Past Medical History:  Diagnosis Date  . Anxiety   . Arthritis   . Asthma   . COPD (chronic obstructive pulmonary disease) (HCC)   . Depression   . GERD (gastroesophageal reflux disease)     Past Surgical History:  Procedure Laterality Date  . ABDOMINAL HYSTERECTOMY  59 yrs old  . JOINT REPLACEMENT Right  May 2015   Shoulder replacement  . TONSILLECTOMY AND ADENOIDECTOMY  Age 69    There were no vitals filed for this visit.   Subjective Assessment - 09/24/20 1435    Subjective Pt. states L sided low back pain is about 2/10 pain. She has tried doing L LE cross body stretch with variable success.    Limitations Standing;Walking;House hold activities    How long can you stand comfortably? 10-15    How long can you walk comfortably? 5-10    Patient Stated Goals reduce/eliminate pain, be more active    Currently in Pain? Yes    Pain Score 2     Pain Location Back    Pain Orientation Left    Pain Type Chronic pain            Manual:  STM to L sided thoracic to lumbar paraspinals: PT felt tightness and trigger points throughout lumbar and thoracic L sided paraspinals. Pt responded very well to manual therapy and demonstrated improvements in pain and  overall mobility.        PT Long Term Goals - 09/18/20 1501      PT LONG TERM GOAL #1   Title Pt. will be able to walk for longer than 10 mins with pain no more than 2/10 in L low back/hip to improve ability to complete community ambulation without pain.    Baseline 11/10: 5/10 pain L low back, 7/10 pain in L hip    Time 4    Period Weeks    Status New    Target Date 10/16/20      PT LONG TERM GOAL #2   Title Pt. will report ability to complete dishes standing at sink without needing to sit down to improve ability to complete ADLs without pain.    Baseline 11/10: Pt can only stand for approx 10-15 mins before pain increases (5/10 L low back, 7/10 L hip)    Time 4    Period Weeks    Status New    Target Date 10/16/20      PT LONG TERM GOAL #3   Title Pt. will improve bilat hip flexion strength by at least half a muslce grade to improve pain free functional ability.  Baseline 11/10: R/L out of 5: hip flex: 4/3+    Time 4    Period Weeks    Status New    Target Date 10/16/20      PT LONG TERM GOAL #4   Title Pt. will improve FOTO to 50 to improve pain free functional mobility.    Baseline 11/10: 41    Time 4    Period Weeks    Status New    Target Date 10/16/20                 Plan - 09/24/20 1518    Clinical Impression Statement Pt. responded very well to manual therapy on L sided thoracic and lumbar paraspinals. Pt. states she had no pain after session of manual therapy. Pt. instructed to maintain movement throughout day and to monitor her sx between today and her next appt on Thursday. Pt. educated on how core strengthening will begin next week. Pt. will continue to benefit from skilled PT to improve strength and decrease back pain to improve pain free functional mobility.    Personal Factors and Comorbidities Time since onset of injury/illness/exacerbation;Past/Current Experience;Fitness    Examination-Activity Limitations Squat;Stairs;Stand;Locomotion Level     Examination-Participation Restrictions Cleaning;Aaron Mose    Stability/Clinical Decision Making Stable/Uncomplicated    Clinical Decision Making Low    Rehab Potential Good    PT Frequency 2x / week    PT Duration 4 weeks    PT Treatment/Interventions ADLs/Self Care Home Management;Cryotherapy;Electrical Stimulation;Iontophoresis 4mg /ml Dexamethasone;Moist Heat;Gait training;Stair training;Functional mobility training;Therapeutic activities;Therapeutic exercise;Balance training;Neuromuscular re-education;Patient/family education;Manual techniques;Dry needling    PT Next Visit Plan soft tissue, core progression, graded exercise plan    Consulted and Agree with Plan of Care Patient           Patient will benefit from skilled therapeutic intervention in order to improve the following deficits and impairments:  Decreased activity tolerance, Decreased endurance, Decreased mobility, Decreased range of motion, Decreased strength, Difficulty walking, Hypomobility, Increased fascial restricitons, Impaired flexibility, Pain, Postural dysfunction, Improper body mechanics, Increased muscle spasms  Visit Diagnosis: Chronic left-sided low back pain without sciatica  Decreased mobility  Muscle weakness (generalized)     Problem List Patient Active Problem List   Diagnosis Date Noted  . Obesity 08/22/2015  . Lumbar degenerative disc disease 08/22/2015  . GERD (gastroesophageal reflux disease) 04/25/2015  . Arthritis 12/20/2014  . Hyperlipemia 10/30/2014  . Chronic obstructive pulmonary disease (HCC) 03/29/2014  . Current tobacco use 03/29/2014   03/31/2014, PT, DPT # [id number] Cammie Mcgee, SPT 09/25/2020, 8:30 AM  [place] [facility] [address] [place], [place], [place] Phone: [phone]   Fax:  [phone]  Name: Kelsey Rose MRN: [name] Date of Birth: April 20, 1961

## 2020-09-25 ENCOUNTER — Encounter: Payer: Medicare Other | Admitting: Physical Therapy

## 2020-09-26 ENCOUNTER — Ambulatory Visit: Payer: Medicare Other | Admitting: Physical Therapy

## 2020-09-26 ENCOUNTER — Encounter: Payer: Medicare Other | Admitting: Physical Therapy

## 2020-09-26 ENCOUNTER — Other Ambulatory Visit: Payer: Self-pay

## 2020-09-26 ENCOUNTER — Encounter: Payer: Self-pay | Admitting: Physical Therapy

## 2020-09-26 DIAGNOSIS — M6281 Muscle weakness (generalized): Secondary | ICD-10-CM

## 2020-09-26 DIAGNOSIS — R2689 Other abnormalities of gait and mobility: Secondary | ICD-10-CM

## 2020-09-26 DIAGNOSIS — M545 Low back pain, unspecified: Secondary | ICD-10-CM | POA: Diagnosis not present

## 2020-09-26 NOTE — Therapy (Signed)
[place] [facility] [address] [place], [place], [address] Phone: [phone]   Fax:  [phone]  Physical Therapy Treatment  Patient Details  Name: Kelsey Rose MRN: [id number] Date of Birth: Jun 29, 1961 Referring Provider (PT): Juanell Fairly, MD   Encounter Date: 09/26/2020   PT End of Session - 09/26/20 1336    Visit Number 3    Number of Visits 9    Date for PT Re-Evaluation 10/16/20    Authorization - Visit Number 3    Authorization - Number of Visits 10    PT Start Time 1333    PT Stop Time 1415    PT Time Calculation (min) 42 min    Activity Tolerance Patient tolerated treatment well    Behavior During Therapy [facility] for tasks assessed/performed           Past Medical History:  Diagnosis Date  . Anxiety   . Arthritis   . Asthma   . COPD (chronic obstructive pulmonary disease) (HCC)   . Depression   . GERD (gastroesophageal reflux disease)     Past Surgical History:  Procedure Laterality Date  . ABDOMINAL HYSTERECTOMY  59 yrs old  . JOINT REPLACEMENT Right  May 2015   Shoulder replacement  . TONSILLECTOMY AND ADENOIDECTOMY  Age 2    There were no vitals filed for this visit.   Subjective Assessment - 09/26/20 1334    Subjective Pt. states that her back is much better, but still painful. She no longer feels the "knot" in her low back.    Limitations Standing;Walking;House hold activities    How long can you stand comfortably? 10-15    How long can you walk comfortably? 5-10    Patient Stated Goals reduce/eliminate pain, be more active    Currently in Pain? Yes    Pain Score 1     Pain Location Back    Pain Orientation Left                Manual:  STM to L sided thoracic to lumbar paraspinals: PT felt tightness and trigger points throughout lumbar and thoracic L sided paraspinals, decreased since last session. Pt responded very well to manual therapy and demonstrated improvements in pain and  overall mobility.         PT Long Term Goals - 09/18/20 1501      PT LONG TERM GOAL #1   Title Pt. will be able to walk for longer than 10 mins with pain no more than 2/10 in L low back/hip to improve ability to complete community ambulation without pain.    Baseline 11/10: 5/10 pain L low back, 7/10 pain in L hip    Time 4    Period Weeks    Status New    Target Date 10/16/20      PT LONG TERM GOAL #2   Title Pt. will report ability to complete dishes standing at sink without needing to sit down to improve ability to complete ADLs without pain.    Baseline 11/10: Pt can only stand for approx 10-15 mins before pain increases (5/10 L low back, 7/10 L hip)    Time 4    Period Weeks    Status New    Target Date 10/16/20      PT LONG TERM GOAL #3   Title Pt. will improve bilat hip flexion strength by at least half a muslce grade to improve pain free functional ability.  Baseline 11/10: R/L out of 5: hip flex: 4/3+    Time 4    Period Weeks    Status New    Target Date 10/16/20      PT LONG TERM GOAL #4   Title Pt. will improve FOTO to 50 to improve pain free functional mobility.    Baseline 11/10: 41    Time 4    Period Weeks    Status New    Target Date 10/16/20                 Plan - 09/26/20 1422    Clinical Impression Statement Pt. continues to respond well to manual therapy on L sided lumbar paraspinals. Pt. states she has significantly decreased pain after last session. Pt. will benefit from core training and glute strengthening in future sessions. Pt. will continue to benefit from skilled PT to improve strength and decrease back pain to improve pain free functional mobility.    Personal Factors and Comorbidities Time since onset of injury/illness/exacerbation;Past/Current Experience;Fitness    Examination-Activity Limitations Squat;Stairs;Stand;Locomotion Level    Examination-Participation Restrictions Cleaning;Aaron Mose    Stability/Clinical  Decision Making Stable/Uncomplicated    Clinical Decision Making Low    Rehab Potential Good    PT Frequency 2x / week    PT Duration 4 weeks    PT Treatment/Interventions ADLs/Self Care Home Management;Cryotherapy;Electrical Stimulation;Iontophoresis 4mg /ml Dexamethasone;Moist Heat;Gait training;Stair training;Functional mobility training;Therapeutic activities;Therapeutic exercise;Balance training;Neuromuscular re-education;Patient/family education;Manual techniques;Dry needling    PT Next Visit Plan soft tissue, core progression, graded exercise plan    Consulted and Agree with Plan of Care Patient           Patient will benefit from skilled therapeutic intervention in order to improve the following deficits and impairments:  Decreased activity tolerance, Decreased endurance, Decreased mobility, Decreased range of motion, Decreased strength, Difficulty walking, Hypomobility, Increased fascial restricitons, Impaired flexibility, Pain, Postural dysfunction, Improper body mechanics, Increased muscle spasms  Visit Diagnosis: Chronic left-sided low back pain without sciatica  Decreased mobility  Muscle weakness (generalized)     Problem List Patient Active Problem List   Diagnosis Date Noted  . Obesity 08/22/2015  . Lumbar degenerative disc disease 08/22/2015  . GERD (gastroesophageal reflux disease) 04/25/2015  . Arthritis 12/20/2014  . Hyperlipemia 10/30/2014  . Chronic obstructive pulmonary disease (HCC) 03/29/2014  . Current tobacco use 03/29/2014   03/31/2014, PT, DPT # [id number] Cammie Mcgee, SPT 09/27/2020, 10:16 AM  [place] [facility] [address] [place], [place], [place] Phone: [phone]   Fax:  [phone]  Name: Kelsey Rose MRN: [name] Date of Birth: 06-02-1961

## 2020-09-30 ENCOUNTER — Encounter: Payer: Medicare Other | Admitting: Physical Therapy

## 2020-10-01 ENCOUNTER — Encounter: Payer: Medicare Other | Admitting: Physical Therapy

## 2020-10-01 ENCOUNTER — Ambulatory Visit: Payer: Medicare Other

## 2020-10-01 DIAGNOSIS — G8929 Other chronic pain: Secondary | ICD-10-CM

## 2020-10-02 ENCOUNTER — Encounter: Payer: Medicare Other | Admitting: Physical Therapy

## 2020-10-07 ENCOUNTER — Encounter: Payer: Medicare Other | Admitting: Physical Therapy

## 2020-10-08 ENCOUNTER — Ambulatory Visit: Payer: Medicare Other

## 2020-10-08 ENCOUNTER — Other Ambulatory Visit: Payer: Self-pay

## 2020-10-08 ENCOUNTER — Encounter: Payer: Medicare Other | Admitting: Physical Therapy

## 2020-10-08 DIAGNOSIS — M545 Low back pain, unspecified: Secondary | ICD-10-CM

## 2020-10-08 DIAGNOSIS — M6281 Muscle weakness (generalized): Secondary | ICD-10-CM

## 2020-10-08 DIAGNOSIS — G8929 Other chronic pain: Secondary | ICD-10-CM

## 2020-10-08 NOTE — Therapy (Signed)
[facility] [address] [place], [place], [address] Phone: [phone]   Fax:  [phone]  Physical Therapy Treatment  Patient Details  Name: Kelsey Rose MRN: [id number] Date of Birth: 05/27/1961 Referring Provider (PT): Juanell Fairly, MD   Encounter Date: 10/08/2020   PT End of Session - 10/08/20 1526    Visit Number 4    Number of Visits 9    Date for PT Re-Evaluation 10/16/20    PT Start Time 1520    PT Stop Time 1600    PT Time Calculation (min) 40 min    Activity Tolerance Patient tolerated treatment well    Behavior During Therapy [facility] for tasks assessed/performed           Past Medical History:  Diagnosis Date  . Anxiety   . Arthritis   . Asthma   . COPD (chronic obstructive pulmonary disease) (HCC)   . Depression   . GERD (gastroesophageal reflux disease)     Past Surgical History:  Procedure Laterality Date  . ABDOMINAL HYSTERECTOMY  59 yrs old  . JOINT REPLACEMENT Right  May 2015   Shoulder replacement  . TONSILLECTOMY AND ADENOIDECTOMY  Age 59    There were no vitals filed for this visit.   Subjective Assessment - 10/08/20 1525    Subjective Pt. states that for the last week her back pain has been going farther down her left posterior thigh. She rates her back pain as 3/10 upon arrival. No specific questions/concerns upon arrival.    Limitations Standing;Walking;House hold activities    How long can you stand comfortably? 10-15    How long can you walk comfortably? 5-10    Patient Stated Goals reduce/eliminate pain, be more active    Currently in Pain? Yes    Pain Score 3     Pain Location Back    Pain Orientation Left    Pain Descriptors / Indicators Stabbing    Pain Type Chronic pain    Pain Onset More than a month ago             TREATMENT   Manual Therapy  NuStep L2 x 5 minutes for warm-up during history 2 minutes unbilled; CPA L1-L5, grade I-II, 20s/bout x 2 bouts/level; L  UPA L1-L5, grade I-II, 20s/bout x 1 bout/level; STM to L sided thoracic to lumbar paraspinals with short duration trigger point release, also utilized Hypervolt for percussive STM;   Electrical Stimulation  Education performed with patient regarding potential benefit of TDN electrical stimulation. Reviewed precautions and risks with patient. Pt provided verbal consent to treatment. Utilized E-stim II Unit for dry needling electrical stimulation. Positive lead at L5 multifidus (0.30 x 52mm needle), negative lead at L3 multifidus (0.30 x 75 mm needle). Asymmetric Biphasic Square Wave Continuous, 280 Microseconds (?S), 2Hz  frequency, at pt tolerated intensity of 4 x 8 minutes;    Utilized passive accessory spinal mobilizations as well as soft tissue mobilization during session today. Also utilized dry needling electrical stimulation. Patient reports full resolution of her pain at the end of the session. Patient encouraged to continue HEP and follow-up as scheduled. Pt will benefit from PT services to address deficits in back pain in order to return to full function at home.                          PT Long Term Goals - 09/18/20 1501      PT  LONG TERM GOAL #1   Title Pt. will be able to walk for longer than 10 mins with pain no more than 2/10 in L low back/hip to improve ability to complete community ambulation without pain.    Baseline 11/10: 5/10 pain L low back, 7/10 pain in L hip    Time 4    Period Weeks    Status New    Target Date 10/16/20      PT LONG TERM GOAL #2   Title Pt. will report ability to complete dishes standing at sink without needing to sit down to improve ability to complete ADLs without pain.    Baseline 11/10: Pt can only stand for approx 10-15 mins before pain increases (5/10 L low back, 7/10 L hip)    Time 4    Period Weeks    Status New    Target Date 10/16/20      PT LONG TERM GOAL #3   Title Pt. will improve bilat hip flexion strength by at  least half a muslce grade to improve pain free functional ability.    Baseline 11/10: R/L out of 5: hip flex: 4/3+    Time 4    Period Weeks    Status New    Target Date 10/16/20      PT LONG TERM GOAL #4   Title Pt. will improve FOTO to 50 to improve pain free functional mobility.    Baseline 11/10: 41    Time 4    Period Weeks    Status New    Target Date 10/16/20                 Plan - 10/08/20 1527    Clinical Impression Statement Utilized passive accessory spinal mobilizations as well as soft tissue mobilization during session today. Also utilized dry needling electrical stimulation. Patient reports full resolution of her pain at the end of the session. Patient encouraged to continue HEP and follow-up as scheduled. Pt will benefit from PT services to address deficits in back pain in order to return to full function at home.    Personal Factors and Comorbidities Time since onset of injury/illness/exacerbation;Past/Current Experience;Fitness    Examination-Activity Limitations Squat;Stairs;Stand;Locomotion Level    Examination-Participation Restrictions Cleaning;Pincus Badder [facility]    Stability/Clinical Decision Making Stable/Uncomplicated    Rehab Potential Good    PT Frequency 2x / week    PT Duration 4 weeks    PT Treatment/Interventions ADLs/Self Care Home Management;Cryotherapy;Electrical Stimulation;Iontophoresis 4mg /ml Dexamethasone;Moist Heat;Gait training;Stair training;Functional mobility training;Therapeutic activities;Therapeutic exercise;Balance training;Neuromuscular re-education;Patient/family education;Manual techniques;Dry needling    PT Next Visit Plan soft tissue, core progression, graded exercise plan    Consulted and Agree with Plan of Care Patient           Patient will benefit from skilled therapeutic intervention in order to improve the following deficits and impairments:  Decreased activity tolerance, Decreased endurance, Decreased mobility,  Decreased range of motion, Decreased strength, Difficulty walking, Hypomobility, Increased fascial restricitons, Impaired flexibility, Pain, Postural dysfunction, Improper body mechanics, Increased muscle spasms  Visit Diagnosis: Chronic left-sided low back pain without sciatica  Muscle weakness (generalized)     Problem List Patient Active Problem List   Diagnosis Date Noted  . Obesity 08/22/2015  . Lumbar degenerative disc disease 08/22/2015  . GERD (gastroesophageal reflux disease) 04/25/2015  . Arthritis 12/20/2014  . Hyperlipemia 10/30/2014  . Chronic obstructive pulmonary disease (HCC) 03/29/2014  . Current tobacco use 03/29/2014   03/31/2014 Iaan Oregel PT, DPT, GCS  Shanesha Bednarz 10/08/2020, 5:40 PM  [place] [facility] [address] [place], [place], [address] Phone: [phone]   Fax:  [phone]  Name: Jalexus Brett MRN: [id number] Date of Birth: Sep 13, 1961

## 2020-10-09 ENCOUNTER — Encounter: Payer: Medicare Other | Admitting: Physical Therapy

## 2020-10-10 ENCOUNTER — Other Ambulatory Visit: Payer: Self-pay

## 2020-10-10 ENCOUNTER — Ambulatory Visit: Payer: Medicare Other | Attending: Orthopedic Surgery

## 2020-10-10 ENCOUNTER — Encounter: Payer: Medicare Other | Admitting: Physical Therapy

## 2020-10-10 DIAGNOSIS — G8929 Other chronic pain: Secondary | ICD-10-CM | POA: Insufficient documentation

## 2020-10-10 DIAGNOSIS — M6281 Muscle weakness (generalized): Secondary | ICD-10-CM | POA: Insufficient documentation

## 2020-10-10 DIAGNOSIS — M545 Low back pain, unspecified: Secondary | ICD-10-CM | POA: Insufficient documentation

## 2020-10-10 NOTE — Therapy (Signed)
[place] [facility] [address] [place], [place], [address] Phone: [phone]   Fax:  [phone]  Physical Therapy Treatment  Patient Details  Name: Kelsey Rose MRN: [id number] Date of Birth: 1961/07/21 Referring Provider (PT): Juanell Fairly, MD   Encounter Date: 10/10/2020   PT End of Session - 10/10/20 1609    Visit Number 5    Number of Visits 9    Date for PT Re-Evaluation 10/16/20    PT Start Time 1602    PT Stop Time 1645    PT Time Calculation (min) 43 min    Activity Tolerance Patient tolerated treatment well    Behavior During Therapy [facility] for tasks assessed/performed           Past Medical History:  Diagnosis Date  . Anxiety   . Arthritis   . Asthma   . COPD (chronic obstructive pulmonary disease) (HCC)   . Depression   . GERD (gastroesophageal reflux disease)     Past Surgical History:  Procedure Laterality Date  . ABDOMINAL HYSTERECTOMY  59 yrs old  . JOINT REPLACEMENT Right  May 2015   Shoulder replacement  . TONSILLECTOMY AND ADENOIDECTOMY  Age 59    There were no vitals filed for this visit.   Subjective Assessment - 10/10/20 1608    Subjective Pt reports that she is having a lot of symptoms down her LLE today. She had two really good days after her last therapy session however worked out in the yard and is having increased soreness today. She complains of 2/10 pain at rest upon arrival.    Limitations Standing;Walking;House hold activities    How long can you stand comfortably? 10-15    How long can you walk comfortably? 5-10    Patient Stated Goals reduce/eliminate pain, be more active    Currently in Pain? Yes    Pain Score 2     Pain Location Back    Pain Orientation Left    Pain Descriptors / Indicators Stabbing    Pain Type Chronic pain    Pain Onset More than a month ago    Pain Frequency Constant             TREATMENT   Manual Therapy  NuStep L2 x 6 minutes for warm-up  during history 2 minutes unbilled; CPA L1-L5, grade I-II, 20s/bout x 2 bouts/level; L UPA L1-L5, grade I-II, 20s/bout x 1 bout/level; STM to L sided thoracic to lumbar paraspinals with short duration trigger point release;   Electrical Stimulation  Education performed with patient regarding potential benefit of TDN electrical stimulation. Reviewed precautions and risks with patient. Pt provided verbal consent to treatment. Utilized E-stim II Unit for dry needling electrical stimulation. Positive lead at L5 multifidus (0.30 x 34mm needle), negative lead at L3 multifidus (0.30 x 75 mm needle). Asymmetric Biphasic Square Wave Continuous, 280 Microseconds (?S), 2Hz  frequency, at pt tolerated intensity of 4 x 8 minutes;    Utilized passive accessory spinal mobilizations as well as soft tissue mobilization during session today. Also utilized dry needling electrical stimulation again today as pt reports it was beneficial during last session. Patient reports significant improvement in her pain at the end of the session today with notable decrease in her antalgic gait upon leaving. Patient encouraged to continue HEP and follow-up as scheduled. Pt will benefit from PT services to address deficits in back pain in order to return to full function at home.  PT Long Term Goals - 09/18/20 1501      PT LONG TERM GOAL #1   Title Pt. will be able to walk for longer than 10 mins with pain no more than 2/10 in L low back/hip to improve ability to complete community ambulation without pain.    Baseline 11/10: 5/10 pain L low back, 7/10 pain in L hip    Time 4    Period Weeks    Status New    Target Date 10/16/20      PT LONG TERM GOAL #2   Title Pt. will report ability to complete dishes standing at sink without needing to sit down to improve ability to complete ADLs without pain.    Baseline 11/10: Pt can only stand for approx 10-15 mins  before pain increases (5/10 L low back, 7/10 L hip)    Time 4    Period Weeks    Status New    Target Date 10/16/20      PT LONG TERM GOAL #3   Title Pt. will improve bilat hip flexion strength by at least half a muslce grade to improve pain free functional ability.    Baseline 11/10: R/L out of 5: hip flex: 4/3+    Time 4    Period Weeks    Status New    Target Date 10/16/20      PT LONG TERM GOAL #4   Title Pt. will improve FOTO to 50 to improve pain free functional mobility.    Baseline 11/10: 41    Time 4    Period Weeks    Status New    Target Date 10/16/20                 Plan - 10/10/20 1609    Clinical Impression Statement Utilized passive accessory spinal mobilizations as well as soft tissue mobilization during session today. Also utilized dry needling electrical stimulation again today as pt reports it was beneficial during last session. Patient reports significant improvement in her pain at the end of the session today with notable decrease in her antalgic gait upon leaving. Patient encouraged to continue HEP and follow-up as scheduled. Pt will benefit from PT services to address deficits in back pain in order to return to full function at home.    Personal Factors and Comorbidities Time since onset of injury/illness/exacerbation;Past/Current Experience;Fitness    Examination-Activity Limitations Squat;Stairs;Stand;Locomotion Level    Examination-Participation Restrictions Cleaning;Pincus Badder [facility]    Stability/Clinical Decision Making Stable/Uncomplicated    Rehab Potential Good    PT Frequency 2x / week    PT Duration 4 weeks    PT Treatment/Interventions ADLs/Self Care Home Management;Cryotherapy;Electrical Stimulation;Iontophoresis 4mg /ml Dexamethasone;Moist Heat;Gait training;Stair training;Functional mobility training;Therapeutic activities;Therapeutic exercise;Balance training;Neuromuscular re-education;Patient/family education;Manual techniques;Dry needling     PT Next Visit Plan soft tissue, core progression, graded exercise plan    Consulted and Agree with Plan of Care Patient           Patient will benefit from skilled therapeutic intervention in order to improve the following deficits and impairments:  Decreased activity tolerance, Decreased endurance, Decreased mobility, Decreased range of motion, Decreased strength, Difficulty walking, Hypomobility, Increased fascial restricitons, Impaired flexibility, Pain, Postural dysfunction, Improper body mechanics, Increased muscle spasms  Visit Diagnosis: Chronic left-sided low back pain without sciatica  Muscle weakness (generalized)     Problem List Patient Active Problem List   Diagnosis Date Noted  . Obesity 08/22/2015  . Lumbar degenerative disc disease 08/22/2015  . GERD (  gastroesophageal reflux disease) 04/25/2015  . Arthritis 12/20/2014  . Hyperlipemia 10/30/2014  . Chronic obstructive pulmonary disease (HCC) 03/29/2014  . Current tobacco use 03/29/2014   Kelsey Rose PT, DPT, GCS  Kelsey Rose 10/10/2020, 5:28 PM  [place] [facility] [address] [place], [place], [address] Phone: [phone]   Fax:  [phone]  Name: Kelsey Rose MRN: [id number] Date of Birth: 06-Mar-1961

## 2020-10-15 ENCOUNTER — Ambulatory Visit: Payer: Medicare Other

## 2020-10-15 NOTE — Patient Instructions (Incomplete)
TREATMENT   Manual Therapy  NuStep L2 x 6 minutes for warm-up during history 2 minutes unbilled; CPA L1-L5, grade I-II, 20s/bout x 2 bouts/level; L UPA L1-L5, grade I-II, 20s/bout x 1 bout/level; STM to L sided thoracic to lumbar paraspinals with short duration trigger point release;   Electrical Stimulation  Education performed with patient regarding potential benefit of TDN electrical stimulation. Reviewed precautions and risks with patient. Pt provided verbal consent to treatment. Utilized E-stim II Unit for dry needling electrical stimulation. Positive lead at L5 multifidus (0.30 x 77mm needle), negative lead at L3 multifidus (0.30 x 75 mm needle). Asymmetric Biphasic Square Wave Continuous, 280 Microseconds (?S), 2Hz  frequency, at pt tolerated intensity of 4 x 8 minutes;    Utilized passive accessory spinal mobilizations as well as soft tissue mobilization during session today. Also utilized dry needling electrical stimulation again today as pt reports it was beneficial during last session. Patient reports significant improvement in her pain at the end of the session today with notable decrease in her antalgic gait upon leaving. Patient encouraged to continue HEP and follow-up as scheduled. Pt will benefit from PT services to address deficits in back pain in order to return to full function at home.

## 2020-10-17 ENCOUNTER — Ambulatory Visit: Payer: Medicare Other

## 2020-10-17 NOTE — Patient Instructions (Incomplete)
Recertification Standing tolerance, walking tolerance, hip flexion strength, FOTO  TREATMENT   Manual Therapy  NuStep L2 x 6 minutes for warm-up during history 2 minutes unbilled; CPA L1-L5, grade I-II, 20s/bout x 2 bouts/level; L UPA L1-L5, grade I-II, 20s/bout x 1 bout/level; STM to L sided thoracic to lumbar paraspinals with short duration trigger point release;   Electrical Stimulation  Education performed with patient regarding potential benefit of TDN electrical stimulation. Reviewed precautions and risks with patient. Pt provided verbal consent to treatment. Utilized E-stim II Unit for dry needling electrical stimulation. Positive lead at L L5 multifidus (0.30 x 39mm needle), negative lead at L L3 multifidus (0.30 x 75 mm needle). Asymmetric Biphasic Square Wave Continuous, 280 Microseconds (?S), 2Hz  frequency, at pt tolerated intensity of 4 x 8 minutes;    Utilized passive accessory spinal mobilizations as well as soft tissue mobilization during session today. Also utilized dry needling electrical stimulation again today as pt reports it was beneficial during last session. Patient reports significant improvement in her pain at the end of the session today with notable decrease in her antalgic gait upon leaving. Patient encouraged to continue HEP and follow-up as scheduled. Pt will benefit from PT services to address deficits in back pain in order to return to full function at home.

## 2021-05-29 ENCOUNTER — Other Ambulatory Visit: Payer: Self-pay

## 2021-05-29 ENCOUNTER — Ambulatory Visit: Payer: Medicare Other | Attending: Family

## 2021-05-29 DIAGNOSIS — M6281 Muscle weakness (generalized): Secondary | ICD-10-CM | POA: Insufficient documentation

## 2021-05-29 DIAGNOSIS — G8929 Other chronic pain: Secondary | ICD-10-CM | POA: Insufficient documentation

## 2021-05-29 DIAGNOSIS — M545 Low back pain, unspecified: Secondary | ICD-10-CM | POA: Insufficient documentation

## 2021-05-29 NOTE — Patient Instructions (Signed)
Access Code: [email or web address] URL: [email or web address] Date: 05/29/2021 Prepared by: Ria Comment  Exercises Supine Lower Trunk Rotation - 1 x daily - 7 x weekly - 2 sets - 10 reps Seated Lumbar Flexion Stretch - 1 x daily - 7 x weekly - 3 reps - 30s hold Supine Pelvic Tilt - 1 x daily - 7 x weekly - 2 sets - 10 reps - 5s hold

## 2021-05-29 NOTE — Therapy (Signed)
[place] [facility] [address] [place], [place], [address] Phone: [phone]   Fax:  [phone]  Physical Therapy Evaluation  Patient Details  Name: Kelsey ParisGlenda Faye Hottel MRN: [id number] Date of Birth: 07/02/1961 Referring Provider (PT): Etheleen NicksKeri MacDonald NP   Encounter Date: 05/29/2021   PT End of Session - 05/29/21 1357     Visit Number 1    Number of Visits 17    Date for PT Re-Evaluation 07/24/21    Authorization Type eval: 05/29/21    PT Start Time 1400    PT Stop Time 1445    PT Time Calculation (min) 45 min    Activity Tolerance Patient tolerated treatment well    Behavior During Therapy [facility] for tasks assessed/performed             Past Medical History:  Diagnosis Date   Anxiety    Arthritis    Asthma    COPD (chronic obstructive pulmonary disease) (HCC)    Depression    GERD (gastroesophageal reflux disease)     Past Surgical History:  Procedure Laterality Date   ABDOMINAL HYSTERECTOMY  60 yrs old   JOINT REPLACEMENT Right  May 2015   Shoulder replacement   TONSILLECTOMY AND ADENOIDECTOMY  Age 57    There were no vitals filed for this visit.    Subjective Assessment - 05/29/21 1356     Subjective Low back pain    Pertinent History Pt referred for chronic L sided low back pain. She reports that the pain is from arthritis in her back and occasionally refers into her left hip.  Pain for started 7 to 8 years ago.  She denies any traumatic onset or back surgeries.  She has tried injections but was allergic to the dye.  She has also tried physical therapy in the past with some benefit.  Most recent lumbar MRI was 06/2015 and showed lower thoracic degenerative disc disease, with advanced changes at T12-L1. L3-4, L4-5, and L5-S1 degenerative disc disease. Mild left neural foraminal narrowing at L3-4 and L5-S1.  She has a history of R shoulder replacement. No history of L shoulder surgery but complains of regular left shoulder  pain.    Limitations Standing;Walking;House hold activities;Sitting    How long can you sit comfortably? 30 minutes    How long can you stand comfortably? 5-10 minutes    How long can you walk comfortably? 20 minutes    Diagnostic tests See history    Patient Stated Goals reduce/eliminate pain, be more active    Currently in Pain? No/denies    Pain Score 0-No pain   Best: 0/10, Worst: 6/10   Pain Location Back    Pain Orientation Left;Lower    Pain Descriptors / Indicators Throbbing    Pain Type Chronic pain    Pain Radiating Towards Left hip    Pain Onset More than a month ago    Pain Frequency Intermittent                [facility] PT Assessment - 05/29/21 1521       Assessment   Medical Diagnosis Chronic bilateral low back pain without sciatica, lumbar degenerative disc disease    Referring Provider (PT) Etheleen NicksKeri MacDonald NP    Onset Date/Surgical Date 05/29/13   Approximate   Hand Dominance Right    Next MD Visit Not reported    Prior Therapy Yes previously treated at this clinic      Precautions  Precautions None      Restrictions   Weight Bearing Restrictions No      Home Environment   Living Environment Private residence      Prior Function   Level of Independence Independent               SUBJECTIVE Chief complaint:   History: Pt referred for chronic L sided low back pain. She reports that the pain is from arthritis in her back and occasionally refers into her left hip.  Pain for started 7 to 8 years ago.  She denies any traumatic onset or back surgeries.  She has tried injections but was allergic to the dye.  She has also tried physical therapy in the past with some benefit.  Most recent lumbar MRI was 06/2015 and showed lower thoracic degenerative disc disease, with advanced changes at T12-L1. L3-4, L4-5, and L5-S1 degenerative disc disease. Mild left neural foraminal narrowing at L3-4 and L5-S1.  She has a history of R shoulder replacement. No history of L  shoulder surgery but complains of regular left shoulder pain.  Pain location: bilateral lower lumbar spine Pain: Present 0/10, Best 0/10, Worst 6/10 Pain quality: throbbing Radiating pain: Yes into L hip Numbness/Tingling: No 24 hour pain behavior: increased pain when first waking (not every day) Aggravating factors: lifting, extended standing, cooking, washing dishes Easing factors: Lidocaine patches, ice, dry needling, electrical stimulation (has home unit) How long can you sit: 30 minutes How long can you stand: 5-10 minutes How long can you walk: 20 minutes History of back injury, pain, surgery, or therapy: No surgeries, reports history of chronic pain with therapy Dominant hand: right Imaging: Yes, she reports both plain film radiographs and lumbar MRI (see history);   Occupational demands: On disability Hobbies: Walk dog, play on phone, watch TV Goals: reduce/eliminate pain, be more active Red flags: Negative (bowel/bladder changes, saddle paresthesia, personal history of cancer, chills/fever, night sweats, unrelenting pain, first onset of insidious LBP <20 y/o)    OBJECTIVE  Mental Status Patient is oriented to person, place and time.  Recent memory is intact.  Remote memory is intact.  Attention span and concentration are intact.  Expressive speech is intact.  Patient's fund of knowledge is within normal limits for educational level.  SENSATION: Deferred   MUSCULOSKELETAL: Tremor: None Bulk: Normal Tone: Normal No visible step-off along spinal column  Posture Lumbar lordosis: WNL Iliac crest height: equal bilaterally Lumbar lateral shift: negative Significant upper thoracic kyphosis with forward head and rounded shoulders    Palpation Pt is very tender to palpation on bilateral lumbar paraspinals, left glut med, and left glut max.  Strength (out of 5) R/L 5/5* Hip flexion 5/5* Hip ER 5/5* Hip IR 4-/4-* Hip abduction 4-/4- *Hip adduction 4+/4+* Hip  extension 5/5 Knee extension 5/5 Knee flexion 5/5 Ankle dorsiflexion *Indicates pain   AROM (degrees) R/L (all movements include overpressure unless otherwise stated) Lumbar forward flexion (65): 25% loss Lumbar extension (30): 25% loss Lumbar lateral flexion (25): R: 25% loss L: 25% loss  Thoracic and Lumbar rotation (30 degrees):  R: 25% loss L: 25% loss Hip IR (0-45): 25% loss bilaterally; Hip ER (0-45): Grossly WNL bilaterally; Hip Flexion (0-125): Full bilaterally; *Indicates pain  Repeated Movements Deferred   Muscle Length Hamstrings: R: 70 degrees L: 65 degrees (pain starts at 50 degres)  Passive Accessory Intervertebral Motion (PAIVM) Patient is excessively tender to attempts at central passive accessory mobility testing of lumbar spine L1-L5.  Unable to  fully assess mobility due to pain.    SPECIAL TESTS Lumbar Radiculopathy and Discogenic: Centralization and Peripheralization (SN 92, -LR 0.12): Not examined Slump (SN 83, -LR 0.32): R: Negative L: Positive SLR (SN 92, -LR 0.29): R: Negative L:  Positive Crossed SLR (SP 90): R: Negative L: Negative  Facet Joint: Extension-Rotation (SN 100, -LR 0.0): R: Negative L: Positive  Lumbar Foraminal Stenosis: Lumbar quadrant (SN 70): R: Positive L: Positive  Hip: FABER (SN 81): R: Negative L: Positive FADIR (SN 94): R: Negative L: Negative Hip scour (SN 50): R: Positive L: Negative  SIJ:  Thigh Thrust (SN 88, -LR 0.18) : R: Not examined L: Not examined  Piriformis Syndrome: FAIR Test (SN 88, SP 83): R: Not examined L: Not examined           Objective measurements completed on examination: See above findings.                 PT Short Term Goals - 05/29/21 1513       PT SHORT TERM GOAL #1   Title Pt will be independent with HEP in order to improve strength and decrease back pain in order to improve pain-free function at home.    Time 4    Period Weeks    Status New    Target Date  06/26/21               PT Long Term Goals - 05/29/21 1516       PT LONG TERM GOAL #1   Title Pt will decrease worst back pain as reported on NPRS by at least 2 points in order to demonstrate clinically significant reduction in back pain.    Baseline 05/29/21: worst: 6/10    Time 8    Period Weeks    Status New    Target Date 07/24/21      PT LONG TERM GOAL #2   Title Pt. will report ability to stand for at least 20 minutes without an increase in pain or needing to sit so that she can clean dishes and cook    Baseline 05/29/21: standing endurance 5-10 minutes    Time 8    Period Weeks    Status New    Target Date 07/24/21      PT LONG TERM GOAL #3   Title Pt. will improve pain-free bilateral hip abduction and adduction strength by at least half a muscle grade to improve pain free functional ability    Baseline 05/29/21: 4-/5 bilaterally with pain    Time 8    Period Weeks    Status New    Target Date 07/24/21      PT LONG TERM GOAL #4   Title Pt will increase FOTO to at least 50 in order to demonstrate improvement in pain-free function.    Baseline 05/29/21: 40    Time 8    Period Weeks    Status New    Target Date 07/24/21                    Plan - 05/29/21 1358     Clinical Impression Statement Pt is a pleasant 60 year-old female referred for chronic low back pain. Pt. tender to palpation on bilateral lumbar paraspinals, left glut med, and left glut max. She has weakness in bilateral hip abduction/adduction with pain reported during all resisted strength testing of left hip.  Mild loss of active range of motion in lumbar spine in all  directions but no pain reported.  She is excessively tender to passive accessory mobility testing of lumbar spine so unable to fully assess segmental mobility.  Positive left slump and straight leg raise tests.  Positive left lumbar quadrant test.  Pt. will benefit from skilled PT to improve spinal mobiltiy and decrease pain to  improve ability to complete pain free ADLs, improve community ambulation, and improve pain free functional mobility.    Personal Factors and Comorbidities Time since onset of injury/illness/exacerbation;Past/Current Experience;Fitness;Comorbidity 3+    Comorbidities COPD, tobacco use, obesity    Examination-Activity Limitations Squat;Stand;Locomotion Level;Sit    Examination-Participation Restrictions Cleaning;Aaron Mose    Stability/Clinical Decision Making Evolving/Moderate complexity    Clinical Decision Making Moderate    Rehab Potential Fair    PT Frequency 2x / week    PT Duration 8 weeks    PT Treatment/Interventions ADLs/Self Care Home Management;Cryotherapy;Electrical Stimulation;Iontophoresis 4mg /ml Dexamethasone;Moist Heat;Gait training;Stair training;Functional mobility training;Therapeutic activities;Therapeutic exercise;Balance training;Neuromuscular re-education;Patient/family education;Manual techniques;Dry needling;Aquatic Therapy;Biofeedback;Canalith Repostioning;Passive range of motion;Vestibular;Spinal Manipulations;Joint Manipulations    PT Next Visit Plan Spinal mobilizations soft tissue mobilization, graded strengthening    PT Home Exercise Plan Access Code: [email or web address]    Consulted and Agree with Plan of Care Patient             Patient will benefit from skilled therapeutic intervention in order to improve the following deficits and impairments:  Decreased activity tolerance, Decreased range of motion, Decreased strength, Hypomobility, Pain, Impaired flexibility, Postural dysfunction  Visit Diagnosis: Chronic left-sided low back pain without sciatica     Problem List Patient Active Problem List   Diagnosis Date Noted   Obesity 08/22/2015   Lumbar degenerative disc disease 08/22/2015   GERD (gastroesophageal reflux disease) 04/25/2015   Arthritis 12/20/2014   Hyperlipemia 10/30/2014   Chronic obstructive pulmonary disease (HCC) 03/29/2014   Current  tobacco use 03/29/2014   03/31/2014 PT, DPT, GCS  Pavan Bring 05/29/2021, 3:30 PM  [place] [facility] [address]. [place], [place], [place] Phone: [phone]   Fax:  [phone]  Name: Mishti Swanton MRN: Kelsey Rose Date of Birth: 05/18/1961

## 2021-06-02 ENCOUNTER — Other Ambulatory Visit: Payer: Self-pay

## 2021-06-02 ENCOUNTER — Ambulatory Visit: Payer: Medicare Other

## 2021-06-02 DIAGNOSIS — G8929 Other chronic pain: Secondary | ICD-10-CM

## 2021-06-02 DIAGNOSIS — M545 Low back pain, unspecified: Secondary | ICD-10-CM | POA: Diagnosis not present

## 2021-06-02 DIAGNOSIS — M6281 Muscle weakness (generalized): Secondary | ICD-10-CM

## 2021-06-02 NOTE — Therapy (Signed)
[facility] [address] [place], [place], [address] Phone: [phone]   Fax:  [phone]  Physical Therapy Treatment  Patient Details  Name: Kelsey Rose MRN: [id number] Date of Birth: 26-Sep-1961 Referring Provider (PT): Etheleen Nicks NP   Encounter Date: 06/02/2021   PT End of Session - 06/02/21 0857     Visit Number 2    Number of Visits 17    Date for PT Re-Evaluation 07/24/21    Authorization Type eval: 05/29/21    PT Start Time 0850    PT Stop Time 0930    PT Time Calculation (min) 40 min    Activity Tolerance Patient tolerated treatment well    Behavior During Therapy [facility] for tasks assessed/performed             Past Medical History:  Diagnosis Date   Anxiety    Arthritis    Asthma    COPD (chronic obstructive pulmonary disease) (HCC)    Depression    GERD (gastroesophageal reflux disease)     Past Surgical History:  Procedure Laterality Date   ABDOMINAL HYSTERECTOMY  60 yrs old   JOINT REPLACEMENT Right  May 2015   Shoulder replacement   TONSILLECTOMY AND ADENOIDECTOMY  Age 60    There were no vitals filed for this visit.   Subjective Assessment - 06/02/21 0856     Subjective Pt reports that she is doing well today. She denies any back pain upon arrival. States that she took a hot shower this morning which helped her pain.  No specific questions upon arrival.    Pertinent History Pt referred for chronic L sided low back pain. She reports that the pain is from arthritis in her back and occasionally refers into her left hip.  Pain for started 7 to 8 years ago.  She denies any traumatic onset or back surgeries.  She has tried injections but was allergic to the dye.  She has also tried physical therapy in the past with some benefit.  Most recent lumbar MRI was 06/2015 and showed lower thoracic degenerative disc disease, with advanced changes at T12-L1. L3-4, L4-5, and L5-S1 degenerative disc disease. Mild  left neural foraminal narrowing at L3-4 and L5-S1.  She has a history of R shoulder replacement. No history of L shoulder surgery but complains of regular left shoulder pain.    Limitations Standing;Walking;House hold activities;Sitting    How long can you sit comfortably? 30 minutes    How long can you stand comfortably? 5-10 minutes    How long can you walk comfortably? 20 minutes    Diagnostic tests See history    Patient Stated Goals reduce/eliminate pain, be more active                 TREATMENT     Manual Therapy  NuStep L2 x 6 minutes for warm-up during history 2 minutes unbilled; CPA L1-L5, grade I , 20s/bout x 1 bouts/level, patient struggles to tolerate due to high levels of pain; Single knee-to-chest, hamstring, FADIR, and FABER stretches x30 seconds each bilateral, frequent readjustment needed due to increase in patient's pain; STM to bilateral lumbar paraspinals with Thera-Band roller and effleurage;   Ther-ex  Hooklying anterior/posterior pelvic tilts with 3-second hold x10 each direction; Hooklying posterior pelvic tilt with alternating marches x10 each BLE; Hooklying lumbar rotation rocking 60 seconds x 2;    Pt educated throughout session about proper posture and technique with exercises. Improved exercise technique,  movement at target joints, use of target muscles after min to mod verbal, visual, tactile cues.     Utilized passive accessory spinal mobilizations as well as soft tissue mobilization during session today however patient struggles to tolerate due to highly irritable pain.  Initiated low level strengthening on mat table as well as lumbar active range of motion in order to improve mobility and decrease pain. Patient encouraged to continue HEP and follow-up as scheduled. Pt will benefit from PT services to address deficits in back pain in order to return to full function at home.                               PT Short Term  Goals - 05/29/21 1513       PT SHORT TERM GOAL #1   Title Pt will be independent with HEP in order to improve strength and decrease back pain in order to improve pain-free function at home.    Time 4    Period Weeks    Status New    Target Date 06/26/21               PT Long Term Goals - 05/29/21 1516       PT LONG TERM GOAL #1   Title Pt will decrease worst back pain as reported on NPRS by at least 2 points in order to demonstrate clinically significant reduction in back pain.    Baseline 05/29/21: worst: 6/10    Time 8    Period Weeks    Status New    Target Date 07/24/21      PT LONG TERM GOAL #2   Title Pt. will report ability to stand for at least 20 minutes without an increase in pain or needing to sit so that she can clean dishes and cook    Baseline 05/29/21: standing endurance 5-10 minutes    Time 8    Period Weeks    Status New    Target Date 07/24/21      PT LONG TERM GOAL #3   Title Pt. will improve pain-free bilateral hip abduction and adduction strength by at least half a muscle grade to improve pain free functional ability    Baseline 05/29/21: 4-/5 bilaterally with pain    Time 8    Period Weeks    Status New    Target Date 07/24/21      PT LONG TERM GOAL #4   Title Pt will increase FOTO to at least 50 in order to demonstrate improvement in pain-free function.    Baseline 05/29/21: 40    Time 8    Period Weeks    Status New    Target Date 07/24/21                   Plan - 06/02/21 0857     Clinical Impression Statement Utilized passive accessory spinal mobilizations as well as soft tissue mobilization during session today however patient struggles to tolerate due to highly irritable pain.  Initiated low level strengthening on mat table as well as lumbar active range of motion in order to improve mobility and decrease pain. Patient encouraged to continue HEP and follow-up as scheduled. Pt will benefit from PT services to address deficits in  back pain in order to return to full function at home.    Personal Factors and Comorbidities Time since onset of injury/illness/exacerbation;Past/Current Experience;Fitness;Comorbidity 3+  Comorbidities COPD, tobacco use, obesity    Examination-Activity Limitations Squat;Stand;Locomotion Level;Sit    Examination-Participation Restrictions Cleaning;Aaron Mose    Stability/Clinical Decision Making Evolving/Moderate complexity    Rehab Potential Fair    PT Frequency 2x / week    PT Duration 8 weeks    PT Treatment/Interventions ADLs/Self Care Home Management;Cryotherapy;Electrical Stimulation;Iontophoresis 4mg /ml Dexamethasone;Moist Heat;Gait training;Stair training;Functional mobility training;Therapeutic activities;Therapeutic exercise;Balance training;Neuromuscular re-education;Patient/family education;Manual techniques;Dry needling;Aquatic Therapy;Biofeedback;Canalith Repostioning;Passive range of motion;Vestibular;Spinal Manipulations;Joint Manipulations    PT Next Visit Plan Spinal mobilizations soft tissue mobilization, graded strengthening    PT Home Exercise Plan Access Code: [email or web address]    Consulted and Agree with Plan of Care Patient             Patient will benefit from skilled therapeutic intervention in order to improve the following deficits and impairments:  Decreased activity tolerance, Decreased range of motion, Decreased strength, Hypomobility, Pain, Impaired flexibility, Postural dysfunction  Visit Diagnosis: Chronic left-sided low back pain without sciatica  Muscle weakness (generalized)     Problem List Patient Active Problem List   Diagnosis Date Noted   Obesity 08/22/2015   Lumbar degenerative disc disease 08/22/2015   GERD (gastroesophageal reflux disease) 04/25/2015   Arthritis 12/20/2014   Hyperlipemia 10/30/2014   Chronic obstructive pulmonary disease (HCC) 03/29/2014   Current tobacco use 03/29/2014   03/31/2014 PT, DPT, GCS   Adante Courington 06/02/2021, 11:30 AM  [facility] [address] [place], [place], [place] Phone: [phone]   Fax:  [phone]  Name: Kelsey Rose MRN: [name] Date of Birth: 1961-04-04

## 2021-06-03 ENCOUNTER — Ambulatory Visit: Payer: Medicare Other

## 2021-06-05 ENCOUNTER — Ambulatory Visit: Payer: Medicare Other

## 2021-06-09 ENCOUNTER — Other Ambulatory Visit: Payer: Self-pay

## 2021-06-09 ENCOUNTER — Ambulatory Visit: Payer: Medicare Other | Attending: Family

## 2021-06-09 DIAGNOSIS — R42 Dizziness and giddiness: Secondary | ICD-10-CM | POA: Insufficient documentation

## 2021-06-09 DIAGNOSIS — M6281 Muscle weakness (generalized): Secondary | ICD-10-CM | POA: Insufficient documentation

## 2021-06-09 DIAGNOSIS — M25512 Pain in left shoulder: Secondary | ICD-10-CM | POA: Diagnosis present

## 2021-06-09 DIAGNOSIS — M545 Low back pain, unspecified: Secondary | ICD-10-CM | POA: Diagnosis present

## 2021-06-09 DIAGNOSIS — G8929 Other chronic pain: Secondary | ICD-10-CM | POA: Diagnosis present

## 2021-06-09 NOTE — Therapy (Signed)
[facility] [address] [place], [place], [address] Phone: [phone]   Fax:  [phone]  Physical Therapy Treatment  Patient Details  Name: Kelsey Rose MRN: [id number] Date of Birth: 1961/02/13 Referring Provider (PT): Etheleen Nicks NP   Encounter Date: 06/09/2021   PT End of Session - 06/09/21 1036     Visit Number 3    Number of Visits 17    Date for PT Re-Evaluation 07/24/21    Authorization Type eval: 05/29/21    PT Start Time 0925    PT Stop Time 1000    PT Time Calculation (min) 35 min    Activity Tolerance Patient tolerated treatment well    Behavior During Therapy [facility] for tasks assessed/performed             Past Medical History:  Diagnosis Date   Anxiety    Arthritis    Asthma    COPD (chronic obstructive pulmonary disease) (HCC)    Depression    GERD (gastroesophageal reflux disease)     Past Surgical History:  Procedure Laterality Date   ABDOMINAL HYSTERECTOMY  60 yrs old   JOINT REPLACEMENT Right  May 2015   Shoulder replacement   TONSILLECTOMY AND ADENOIDECTOMY  Age 60    There were no vitals filed for this visit.   Subjective Assessment - 06/09/21 1034     Subjective Pt reports that she strained her back yesterday and is having a lot of pain today. She rates her L low back pain as 9/10 currently. No excessive soreness after last therapy session. No specific questions upon arrival.    Pertinent History Pt referred for chronic L sided low back pain. She reports that the pain is from arthritis in her back and occasionally refers into her left hip.  Pain for started 7 to 8 years ago.  She denies any traumatic onset or back surgeries.  She has tried injections but was allergic to the dye.  She has also tried physical therapy in the past with some benefit.  Most recent lumbar MRI was 06/2015 and showed lower thoracic degenerative disc disease, with advanced changes at T12-L1. L3-4, L4-5, and L5-S1  degenerative disc disease. Mild left neural foraminal narrowing at L3-4 and L5-S1.  She has a history of R shoulder replacement. No history of L shoulder surgery but complains of regular left shoulder pain.    Limitations Standing;Walking;House hold activities;Sitting    How long can you sit comfortably? 30 minutes    How long can you stand comfortably? 5-10 minutes    How long can you walk comfortably? 20 minutes    Diagnostic tests See history    Patient Stated Goals reduce/eliminate pain, be more active                 TREATMENT     Manual Therapy  CPA L1-L5, grade I , 20s/bout x 1 bouts/level, patient struggles to tolerate due to high levels of pain; Single knee-to-chest, lumbar rotation, hamstring, FADIR, and FABER stretches x30 seconds each bilateral, frequent readjustment needed due to increase in patient's pain; STM to bilateral lumbar paraspinals with effleurage, pain is highly irritable;   Electrical Stimulation  HiVolt to bilateral lower lumbar paraspinals to decrease muscle spasm/pain, cycle time 5/5, frequency 100pps, 2s ramp at pt tolerated intensity of 190V x 10 minutes with concurrent moist heat pack on lumbar spine;    Pt educated throughout session about proper posture and technique with exercises. Improved  exercise technique, movement at target joints, use of target muscles after min to mod verbal, visual, tactile cues.     Patient arrives with high level of resting pain after straining her back yesterday.  Utilized passive accessory spinal mobilizations as well as soft tissue mobilization during session today however patient struggles to tolerate due to highly irritable pain.  Utilized high volt electrical stimulation to help decrease muscle spasm and lower lumbar spine.  Patient does report some relief in low back pain at end of session however it remains highly irritable. Patient encouraged to continue HEP and follow-up as scheduled. Pt will benefit from PT  services to address deficits in back pain in order to return to full function at home.                               PT Short Term Goals - 05/29/21 1513       PT SHORT TERM GOAL #1   Title Pt will be independent with HEP in order to improve strength and decrease back pain in order to improve pain-free function at home.    Time 4    Period Weeks    Status New    Target Date 06/26/21               PT Long Term Goals - 05/29/21 1516       PT LONG TERM GOAL #1   Title Pt will decrease worst back pain as reported on NPRS by at least 2 points in order to demonstrate clinically significant reduction in back pain.    Baseline 05/29/21: worst: 6/10    Time 8    Period Weeks    Status New    Target Date 07/24/21      PT LONG TERM GOAL #2   Title Pt. will report ability to stand for at least 20 minutes without an increase in pain or needing to sit so that she can clean dishes and cook    Baseline 05/29/21: standing endurance 5-10 minutes    Time 8    Period Weeks    Status New    Target Date 07/24/21      PT LONG TERM GOAL #3   Title Pt. will improve pain-free bilateral hip abduction and adduction strength by at least half a muscle grade to improve pain free functional ability    Baseline 05/29/21: 4-/5 bilaterally with pain    Time 8    Period Weeks    Status New    Target Date 07/24/21      PT LONG TERM GOAL #4   Title Pt will increase FOTO to at least 50 in order to demonstrate improvement in pain-free function.    Baseline 05/29/21: 40    Time 8    Period Weeks    Status New    Target Date 07/24/21                   Plan - 06/09/21 1040     Clinical Impression Statement Patient arrives with high level of resting pain after straining her back yesterday.  Utilized passive accessory spinal mobilizations as well as soft tissue mobilization during session today however patient struggles to tolerate due to highly irritable pain.  Utilized high  volt electrical stimulation to help decrease muscle spasm and lower lumbar spine.  Patient does report some relief in low back pain at end of session however it  remains highly irritable. Patient encouraged to continue HEP and follow-up as scheduled. Pt will benefit from PT services to address deficits in back pain in order to return to full function at home.    Personal Factors and Comorbidities Time since onset of injury/illness/exacerbation;Past/Current Experience;Fitness;Comorbidity 3+    Comorbidities COPD, tobacco use, obesity    Examination-Activity Limitations Squat;Stand;Locomotion Level;Sit    Examination-Participation Restrictions Cleaning;Aaron Mose    Stability/Clinical Decision Making Evolving/Moderate complexity    Rehab Potential Fair    PT Frequency 2x / week    PT Duration 8 weeks    PT Treatment/Interventions ADLs/Self Care Home Management;Cryotherapy;Electrical Stimulation;Iontophoresis 4mg /ml Dexamethasone;Moist Heat;Gait training;Stair training;Functional mobility training;Therapeutic activities;Therapeutic exercise;Balance training;Neuromuscular re-education;Patient/family education;Manual techniques;Dry needling;Aquatic Therapy;Biofeedback;Canalith Repostioning;Passive range of motion;Vestibular;Spinal Manipulations;Joint Manipulations    PT Next Visit Plan Spinal mobilizations soft tissue mobilization, graded strengthening    PT Home Exercise Plan Access Code: [email or web address]    Consulted and Agree with Plan of Care Patient             Patient will benefit from skilled therapeutic intervention in order to improve the following deficits and impairments:  Decreased activity tolerance, Decreased range of motion, Decreased strength, Hypomobility, Pain, Impaired flexibility, Postural dysfunction  Visit Diagnosis: Chronic left-sided low back pain without sciatica  Muscle weakness (generalized)     Problem List Patient Active Problem List   Diagnosis Date Noted    Obesity 08/22/2015   Lumbar degenerative disc disease 08/22/2015   GERD (gastroesophageal reflux disease) 04/25/2015   Arthritis 12/20/2014   Hyperlipemia 10/30/2014   Chronic obstructive pulmonary disease (HCC) 03/29/2014   Current tobacco use 03/29/2014   03/31/2014 PT, DPT, GCS  Shayann Garbutt 06/09/2021, 1:29 PM  [facility] [address] [place], [place], [place] Phone: [phone]   Fax:  [phone]  Name: Kelsey Rose MRN: [name] Date of Birth: 04/14/1961

## 2021-06-16 ENCOUNTER — Ambulatory Visit: Payer: Medicare Other

## 2021-06-23 ENCOUNTER — Ambulatory Visit: Payer: Medicare Other

## 2021-06-26 ENCOUNTER — Other Ambulatory Visit: Payer: Self-pay

## 2021-06-26 ENCOUNTER — Ambulatory Visit: Payer: Medicare Other

## 2021-06-26 DIAGNOSIS — M6281 Muscle weakness (generalized): Secondary | ICD-10-CM

## 2021-06-26 DIAGNOSIS — G8929 Other chronic pain: Secondary | ICD-10-CM

## 2021-06-26 DIAGNOSIS — M545 Low back pain, unspecified: Secondary | ICD-10-CM | POA: Diagnosis not present

## 2021-06-26 DIAGNOSIS — R42 Dizziness and giddiness: Secondary | ICD-10-CM

## 2021-06-26 NOTE — Therapy (Signed)
[facility] [address] [place], [place], [address] Phone: [phone]   Fax:  [phone]  Physical Therapy Treatment  Patient Details  Name: Kelsey Rose MRN: [id number] Date of Birth: 12/22/1960 Referring Provider (PT): Etheleen Nicks NP   Encounter Date: 06/26/2021   PT End of Session - 06/26/21 1449     Visit Number 4    Number of Visits 17    Date for PT Re-Evaluation 07/24/21    Authorization Type eval: 05/29/21    PT Start Time 1448    PT Stop Time 1530    PT Time Calculation (min) 42 min    Activity Tolerance Patient tolerated treatment well    Behavior During Therapy [facility] for tasks assessed/performed             Past Medical History:  Diagnosis Date   Anxiety    Arthritis    Asthma    COPD (chronic obstructive pulmonary disease) (HCC)    Depression    GERD (gastroesophageal reflux disease)     Past Surgical History:  Procedure Laterality Date   ABDOMINAL HYSTERECTOMY  60 yrs old   JOINT REPLACEMENT Right  May 2015   Shoulder replacement   TONSILLECTOMY AND ADENOIDECTOMY  Age 3    There were no vitals filed for this visit.   Subjective Assessment - 06/26/21 1503     Subjective Pt arrives reporting 4/10 R posterior thigh pain, no back pain upon arrival.  She saw orthopedics at [facility] who is going to send an order for therapy to address her left shoulder pain. No excessive soreness after last therapy session. No specific questions upon arrival.    Pertinent History Pt referred for chronic L sided low back pain. She reports that the pain is from arthritis in her back and occasionally refers into her left hip.  Pain for started 7 to 8 years ago.  She denies any traumatic onset or back surgeries.  She has tried injections but was allergic to the dye.  She has also tried physical therapy in the past with some benefit.  Most recent lumbar MRI was 06/2015 and showed lower thoracic degenerative disc disease, with  advanced changes at T12-L1. L3-4, L4-5, and L5-S1 degenerative disc disease. Mild left neural foraminal narrowing at L3-4 and L5-S1.  She has a history of R shoulder replacement. No history of L shoulder surgery but complains of regular left shoulder pain.    Limitations Standing;Walking;House hold activities;Sitting    How long can you sit comfortably? 30 minutes    How long can you stand comfortably? 5-10 minutes    How long can you walk comfortably? 20 minutes    Diagnostic tests See history    Patient Stated Goals reduce/eliminate pain, be more active                  TREATMENT     Manual Therapy  Single knee-to-chest, lumbar rotation, hamstring, FADIR, and FABER stretches x45 seconds each bilateral, frequent readjustment needed depending on patient response; Right hip long axis distraction, grade 2-3, 30 seconds per bout x3 bouts;   Ther-ex  Hooklying lumbar rocking intermittently throughout session;  Hooklying hip abduction/adduction with manual resistance x 10 each; Prior to performing prone lumbar mobilizations patient reports severe vertigo when rolling over.  Per patient she has had vertigo for multiple years. Therapist performed the Dix-Hallpike test with patient which is positive on the right for upbeating right torsional nystagmus with concurrent vertigo.  Patient is unable to remain in position due to severe nausea and has to sit up. 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 Education with handout performed with patient regarding BPPV. Order sent to PCP to add BPPV and L shoulder pain to PCP;    Pt educated throughout session about proper posture and technique with exercises. Improved exercise technique, movement at target joints, use of target muscles after min to mod verbal, visual, tactile cues.     Patient arrives with excellent motivation to participate in physical therapy.  Patient saw [facility] orthopedics and states that MD was going to send a PT order  to clinic for her left shoulder pain. However it has not yet been received by this clinic. Upon arrival she reports minimal low back pain currently and most of her pain is located in the right posterior thigh.  Utilized stretching and right hip long axis distraction to help with pain.  Prior to performing prone lumbar mobilizations patient reports severe vertigo when rolling over.  Per patient she has had vertigo for multiple years. Therapist performed the Dix-Hallpike test with patient which is positive on the right for upbeating right torsional nystagmus with concurrent vertigo.  Patient is unable to remain in position due to severe nausea and has to sit up.  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  Order sent to PCP to add BPPV and L shoulder pain to her current PT order. Pt encouraged to continue HEP and follow-up as scheduled. Pt will benefit from PT services to address deficits in back pain in order to return to full function at home.                               PT Short Term Goals - 05/29/21 1513       PT SHORT TERM GOAL #1   Title Pt will be independent with HEP in order to improve strength and decrease back pain in order to improve pain-free function at home.    Time 4    Period Weeks    Status New    Target Date 06/26/21               PT Long Term Goals - 05/29/21 1516       PT LONG TERM GOAL #1   Title Pt will decrease worst back pain as reported on NPRS by at least 2 points in order to demonstrate clinically significant reduction in back pain.    Baseline 05/29/21: worst: 6/10    Time 8    Period Weeks    Status New    Target Date 07/24/21      PT LONG TERM GOAL #2   Title Pt. will report ability to stand for at least 20 minutes without an increase in pain or needing to sit so that she can clean dishes and cook    Baseline 05/29/21: standing endurance 5-10 minutes    Time 8    Period Weeks    Status New    Target Date 07/24/21       PT LONG TERM GOAL #3   Title Pt. will improve pain-free bilateral hip abduction and adduction strength by at least half a muscle grade to improve pain free functional ability    Baseline 05/29/21: 4-/5 bilaterally with pain    Time 8    Period Weeks    Status New    Target Date 07/24/21  PT LONG TERM GOAL #4   Title Pt will increase FOTO to at least 50 in order to demonstrate improvement in pain-free function.    Baseline 05/29/21: 40    Time 8    Period Weeks    Status New    Target Date 07/24/21                   Plan - 06/26/21 1456     Clinical Impression Statement Patient arrives with excellent motivation to participate in physical therapy.  Patient saw [facility] orthopedics and states that MD was going to send a PT order to clinic for her left shoulder pain. However it has not yet been received by this clinic. Upon arrival she reports minimal low back pain currently and most of her pain is located in the right posterior thigh.  Utilized stretching and right hip long axis distraction to help with pain.  Prior to performing prone lumbar mobilizations patient reports severe vertigo when rolling over.  Per patient she has had vertigo for multiple years. Therapist performed the Dix-Hallpike test with patient which is positive on the right for upbeating right torsional nystagmus with concurrent vertigo.  Patient is unable to remain in position due to severe nausea and has to sit up.  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  Order sent to PCP to add BPPV and L shoulder pain to her current PT order. Pt encouraged to continue HEP and follow-up as scheduled. Pt will benefit from PT services to address deficits in back pain in order to return to full function at home.    Personal Factors and Comorbidities Time since onset of injury/illness/exacerbation;Past/Current Experience;Fitness;Comorbidity 3+    Comorbidities COPD, tobacco use, obesity    Examination-Activity Limitations  Squat;Stand;Locomotion Level;Sit    Examination-Participation Restrictions Cleaning;Aaron Mose    Stability/Clinical Decision Making Evolving/Moderate complexity    Rehab Potential Fair    PT Frequency 2x / week    PT Duration 8 weeks    PT Treatment/Interventions ADLs/Self Care Home Management;Cryotherapy;Electrical Stimulation;Iontophoresis 4mg /ml Dexamethasone;Moist Heat;Gait training;Stair training;Functional mobility training;Therapeutic activities;Therapeutic exercise;Balance training;Neuromuscular re-education;Patient/family education;Manual techniques;Dry needling;Aquatic Therapy;Biofeedback;Canalith Repostioning;Passive range of motion;Vestibular;Spinal Manipulations;Joint Manipulations    PT Next Visit Plan Spinal mobilizations soft tissue mobilization, graded strengthening, once order received treat BPPV and assess L shoulder pain    PT Home Exercise Plan Access Code: [email or web address]    Consulted and Agree with Plan of Care Patient             Patient will benefit from skilled therapeutic intervention in order to improve the following deficits and impairments:  Decreased activity tolerance, Decreased range of motion, Decreased strength, Hypomobility, Pain, Impaired flexibility, Postural dysfunction  Visit Diagnosis: Chronic left-sided low back pain without sciatica  Muscle weakness (generalized)  Dizziness and giddiness     Problem List Patient Active Problem List   Diagnosis Date Noted   Obesity 08/22/2015   Lumbar degenerative disc disease 08/22/2015   GERD (gastroesophageal reflux disease) 04/25/2015   Arthritis 12/20/2014   Hyperlipemia 10/30/2014   Chronic obstructive pulmonary disease (HCC) 03/29/2014   Current tobacco use 03/29/2014   03/31/2014 Kelsey Rose PT, DPT, GCS  Kelsey Rose 06/26/2021, 4:53 PM   [facility] [address] [place], [place], [place] Phone: [phone]   Fax:  [phone]  Name: Kelsey Rose MRN: [name] Date of Birth: 16-Aug-1961

## 2021-06-30 ENCOUNTER — Ambulatory Visit: Payer: Medicare Other

## 2021-06-30 ENCOUNTER — Other Ambulatory Visit: Payer: Self-pay

## 2021-06-30 DIAGNOSIS — M6281 Muscle weakness (generalized): Secondary | ICD-10-CM

## 2021-06-30 DIAGNOSIS — G8929 Other chronic pain: Secondary | ICD-10-CM

## 2021-06-30 DIAGNOSIS — M545 Low back pain, unspecified: Secondary | ICD-10-CM

## 2021-06-30 NOTE — Therapy (Signed)
[facility] [address] [place], [place], [address] Phone: [phone]   Fax:  [phone]  Physical Therapy Treatment  Patient Details  Name: Kelsey Rose MRN: [id number] Date of Birth: May 04, 1961 Referring Provider (PT): Etheleen Nicks NP   Encounter Date: 06/30/2021   PT End of Session - 06/30/21 1459     Visit Number 5    Number of Visits 17    Date for PT Re-Evaluation 07/24/21    Authorization Type eval: 05/29/21    PT Start Time 1450    PT Stop Time 1530    PT Time Calculation (min) 40 min    Activity Tolerance Patient tolerated treatment well    Behavior During Therapy [facility] for tasks assessed/performed             Past Medical History:  Diagnosis Date   Anxiety    Arthritis    Asthma    COPD (chronic obstructive pulmonary disease) (HCC)    Depression    GERD (gastroesophageal reflux disease)     Past Surgical History:  Procedure Laterality Date   ABDOMINAL HYSTERECTOMY  60 yrs old   JOINT REPLACEMENT Right  May 2015   Shoulder replacement   TONSILLECTOMY AND ADENOIDECTOMY  Age 81    There were no vitals filed for this visit.   Subjective Assessment - 06/30/21 1458     Subjective Pt arrives reporting 4/10 R posterior thigh pain/lateral hip pain upon arrival. Denies back pain currently. No excessive soreness after last therapy session. No specific questions upon arrival.    Pertinent History Pt referred for chronic L sided low back pain. She reports that the pain is from arthritis in her back and occasionally refers into her left hip.  Pain for started 7 to 8 years ago.  She denies any traumatic onset or back surgeries.  She has tried injections but was allergic to the dye.  She has also tried physical therapy in the past with some benefit.  Most recent lumbar MRI was 06/2015 and showed lower thoracic degenerative disc disease, with advanced changes at T12-L1. L3-4, L4-5, and L5-S1 degenerative disc disease.  Mild left neural foraminal narrowing at L3-4 and L5-S1.  She has a history of R shoulder replacement. No history of L shoulder surgery but complains of regular left shoulder pain.    Limitations Standing;Walking;House hold activities;Sitting    How long can you sit comfortably? 30 minutes    How long can you stand comfortably? 5-10 minutes    How long can you walk comfortably? 20 minutes    Diagnostic tests See history    Patient Stated Goals reduce/eliminate pain, be more active                  TREATMENT     Manual Therapy  L sidelying R posterolateral hip STM with Theraband roller and effleurage, pt is very tender just superior to R greater trochanter; Right single knee-to-chest, lumbar rotation, hamstring, FADIR, and FABER stretches x45 seconds each bilateral, frequent readjustment needed depending on patient response; Right hip long axis distraction, grade 2-3, 30 seconds per bout x3 bouts;   Ther-ex  Hooklying isometric clams 10s hold x 10; Hooklying isometric adductor ball squeeze 5s hold x 10; Supine hip abduction/adduction with manual resistance 10s hold x 10 each; Hooklying bridges with green tband around knees to encourage external rotation/abduction engagement during bridges x 10;    Pt educated throughout session about proper posture and technique with  exercises. Improved exercise technique, movement at target joints, use of target muscles after min to mod verbal, visual, tactile cues.     Patient arrives with excellent motivation to participate in physical therapy.  Therapist has not received signed order from PCP to add left shoulder pain and BPPV to her current physical therapy plan of care.  Continued with light stretching, soft tissue mobilization, and strengthening for right lateral hip.  Pt encouraged to continue HEP and follow-up as scheduled. Pt will benefit from PT services to address deficits in back pain in order to return to full function at home.                                PT Short Term Goals - 05/29/21 1513       PT SHORT TERM GOAL #1   Title Pt will be independent with HEP in order to improve strength and decrease back pain in order to improve pain-free function at home.    Time 4    Period Weeks    Status New    Target Date 06/26/21               PT Long Term Goals - 05/29/21 1516       PT LONG TERM GOAL #1   Title Pt will decrease worst back pain as reported on NPRS by at least 2 points in order to demonstrate clinically significant reduction in back pain.    Baseline 05/29/21: worst: 6/10    Time 8    Period Weeks    Status New    Target Date 07/24/21      PT LONG TERM GOAL #2   Title Pt. will report ability to stand for at least 20 minutes without an increase in pain or needing to sit so that she can clean dishes and cook    Baseline 05/29/21: standing endurance 5-10 minutes    Time 8    Period Weeks    Status New    Target Date 07/24/21      PT LONG TERM GOAL #3   Title Pt. will improve pain-free bilateral hip abduction and adduction strength by at least half a muscle grade to improve pain free functional ability    Baseline 05/29/21: 4-/5 bilaterally with pain    Time 8    Period Weeks    Status New    Target Date 07/24/21      PT LONG TERM GOAL #4   Title Pt will increase FOTO to at least 50 in order to demonstrate improvement in pain-free function.    Baseline 05/29/21: 40    Time 8    Period Weeks    Status New    Target Date 07/24/21                   Plan - 06/30/21 1503     Clinical Impression Statement Patient arrives with excellent motivation to participate in physical therapy.  Therapist has not received signed order from PCP to add left shoulder pain and BPPV to her current physical therapy plan of care.  Continued with light stretching, soft tissue mobilization, and strengthening for right lateral hip.  Pt encouraged to continue HEP and follow-up as  scheduled. Pt will benefit from PT services to address deficits in back pain in order to return to full function at home.    Personal Factors and Comorbidities Time since onset of  injury/illness/exacerbation;Past/Current Experience;Fitness;Comorbidity 3+    Comorbidities COPD, tobacco use, obesity    Examination-Activity Limitations Squat;Stand;Locomotion Level;Sit    Examination-Participation Restrictions Cleaning;Aaron Mose    Stability/Clinical Decision Making Evolving/Moderate complexity    Rehab Potential Fair    PT Frequency 2x / week    PT Duration 8 weeks    PT Treatment/Interventions ADLs/Self Care Home Management;Cryotherapy;Electrical Stimulation;Iontophoresis 4mg /ml Dexamethasone;Moist Heat;Gait training;Stair training;Functional mobility training;Therapeutic activities;Therapeutic exercise;Balance training;Neuromuscular re-education;Patient/family education;Manual techniques;Dry needling;Aquatic Therapy;Biofeedback;Canalith Repostioning;Passive range of motion;Vestibular;Spinal Manipulations;Joint Manipulations    PT Next Visit Plan Spinal mobilizations soft tissue mobilization, graded strengthening, once order received treat BPPV and assess L shoulder pain    PT Home Exercise Plan Access Code: [email or web address]    Consulted and Agree with Plan of Care Patient             Patient will benefit from skilled therapeutic intervention in order to improve the following deficits and impairments:  Decreased activity tolerance, Decreased range of motion, Decreased strength, Hypomobility, Pain, Impaired flexibility, Postural dysfunction  Visit Diagnosis: Chronic left-sided low back pain without sciatica  Muscle weakness (generalized)     Problem List Patient Active Problem List   Diagnosis Date Noted   Obesity 08/22/2015   Lumbar degenerative disc disease 08/22/2015   GERD (gastroesophageal reflux disease) 04/25/2015   Arthritis 12/20/2014   Hyperlipemia 10/30/2014   Chronic  obstructive pulmonary disease (HCC) 03/29/2014   Current tobacco use 03/29/2014   03/31/2014 PT, DPT, GCS  Lannah Koike 06/30/2021, 4:45 PM  [facility] [address] [place], [place], [place] Phone: [phone]   Fax:  [phone]  Name: Kelsey Rose MRN: [name] Date of Birth: 1961/10/10

## 2021-07-03 ENCOUNTER — Other Ambulatory Visit: Payer: Self-pay

## 2021-07-03 ENCOUNTER — Ambulatory Visit: Payer: Medicare Other

## 2021-07-03 DIAGNOSIS — M6281 Muscle weakness (generalized): Secondary | ICD-10-CM

## 2021-07-03 DIAGNOSIS — M545 Low back pain, unspecified: Secondary | ICD-10-CM | POA: Diagnosis not present

## 2021-07-03 DIAGNOSIS — G8929 Other chronic pain: Secondary | ICD-10-CM

## 2021-07-03 NOTE — Therapy (Signed)
[facility] [address] [place], [place], [address] Phone: [phone]   Fax:  [phone]  Physical Therapy Treatment  Patient Details  Name: Kelsey Rose MRN: [id number] Date of Birth: 12/27/1960 Referring Provider (PT): Etheleen NicksKeri MacDonald NP   Encounter Date: 07/03/2021   PT End of Session - 07/03/21 1636     Visit Number 6    Number of Visits 17    Date for PT Re-Evaluation 07/24/21    Authorization Type eval: 05/29/21    PT Start Time 1448    PT Stop Time 1530    PT Time Calculation (min) 42 min    Activity Tolerance Patient tolerated treatment well    Behavior [facility] Therapy [facility] for tasks assessed/performed              Past Medical History:  Diagnosis Date   Anxiety    Arthritis    Asthma    COPD (chronic obstructive pulmonary disease) (HCC)    Depression    GERD (gastroesophageal reflux disease)     Past Surgical History:  Procedure Laterality Date   ABDOMINAL HYSTERECTOMY  60 yrs old   JOINT REPLACEMENT Right  May 2015   Shoulder replacement   TONSILLECTOMY AND ADENOIDECTOMY  Age 72    There were no vitals filed for this visit.   Subjective Assessment - 07/03/21 1635     Subjective Pt arrives reporting continued left shoulder pain. No excessive soreness after last therapy session. No specific questions upon arrival.    Pertinent History Pt referred for chronic L sided low back pain. She reports that the pain is from arthritis in her back and occasionally refers into her left hip.  Pain for started 7 to 8 years ago.  She denies any traumatic onset or back surgeries.  She has tried injections but was allergic to the dye.  She has also tried physical therapy in the past with some benefit.  Most recent lumbar MRI was 06/2015 and showed lower thoracic degenerative disc disease, with advanced changes at T12-L1. L3-4, L4-5, and L5-S1 degenerative disc disease. Mild left neural foraminal narrowing at L3-4 and  L5-S1.  She has a history of R shoulder replacement. No history of L shoulder surgery but complains of regular left shoulder pain. 07/03/21 (L shoulder pain): L shoulder pain x 1 year. Intermittently aggravated prior to that but not consistent. No history of L shoulder surgery/injury. Hx of TSR on RUE. Hx of 2 injections in L shoulder. First injection provided 2 weeks of relief. Most recent injection didn't provide relief. Plain film radiographs of L shoulder show mild glenohumeral osteoarthritis. Calcified loose bodies in the axillary recess. Mild acromioclavicular osteoarthritis. She reports that she has had a L shoulder MRI at [facility] but not available for review by therapist. No prior history of therapy for L shoulder. History of chronic neck pain with some aggravation of L shoulder pain when turning head.    Limitations Standing;Walking;House hold activities;Sitting    How long can you sit comfortably? 30 minutes    How long can you stand comfortably? 5-10 minutes    How long can you walk comfortably? 20 minutes    Diagnostic tests See history    Patient Stated Goals reduce/eliminate pain, be more active    Currently in Pain? No/denies    Pain Score 0-No pain    Pain Location Shoulder    Pain Orientation Left    Pain Descriptors / Indicators Sharp;Other (Comment)   "  Wave of pain"   Pain Type Chronic pain    Pain Radiating Towards None    Pain Onset More than a month ago    Pain Frequency Intermittent    Aggravating Factors  reaching out to the side, putting on shirt/bra    Pain Relieving Factors "noting"                   TREATMENT     Manual Therapy   SUBJECTIVE Onset: L shoulder pain x 1 year. Intermittently aggravated prior to that but not consistent. No history of L shoulder surgery/injury. Hx of TSR on RUE. Hx of 2 injections in L shoulder. First injection provided 2 weeks of relief. Most recent injection didn't provide relief. Plain film radiographs of L shoulder  show mild glenohumeral osteoarthritis. Calcified loose bodies in the axillary recess. Mild acromioclavicular osteoarthritis. She reports that she has had a L shoulder MRI at [facility] but not available for review by therapist. No prior history of therapy for L shoulder. History of chronic neck pain with some aggravation of L shoulder pain when turning head.  Shoulder trauma: No  Pain quality: sharp, "wave of pain" Pain: 0/10 Present, 0/10 Best, 6/10 Worst: Aggravating factors: reaching out to the side, putting on shirt/bra Easing factors: Nothing 24 hour pain behavior: hurts more when first waking Pain location: L upper arm Radiating pain: No Numbness/Tingling: No Follow-up appointment with MD: No Dominant hand: Right  OBJECTIVE  MUSCULOSKELETAL: Tremor: Normal Bulk: Normal Tone: Normal  Cervical Screen AROM: Deficits noted and pain with overpressure in a variety of directions but no reproduction of L shoulder pain. Spurlings A (ipsilateral lateral flexion/axial compression): R: Negative L: Negative Spurlings B (ipsilateral lateral flexion/contralateral rotation/axial compression): R: Negative L: Negative Repeated movement: Not tested Hoffman Sign (cervical cord compression): R: Negative L: Negative ULTT Median: R: Not examined L: Not examined ULTT Ulnar: R: Not examined L: Not examined ULTT Radial: R: Not examined L: Not examined  Elbow Screen Elbow AROM: Within Normal Limits bilaterally  Palpation Pain with light palpation to L upper trap, L pec major, L AC joint, L bicipital groove. No pain with palpation over deltoid.  Strength R/L 5/5 Shoulder flexion (anterior deltoid/pec major/coracobrachialis, axillary n. (C5-6) and musculocutaneous n. (C5-7)) 5/5 Shoulder abduction (deltoid/supraspinatus, axillary/suprascapular n, C5) 5/5 Shoulder external rotation (infraspinatus/teres minor) 5/5 Shoulder internal rotation (subcapularis/lats/pec major) 5/5 Elbow flexion (biceps  brachii, brachialis, brachioradialis, musculoskeletal n, C5-6) 5/5 Elbow extension (triceps, radial n, C7) 5/5 Wrist Extension 5/5 Wrist Flexion 5/5 Finger adduction (interossei, ulnar n, T1)  AROM R/L 150*/135* Shoulder flexion 139*/88* Shoulder abduction 59/55* Shoulder external rotation 50/55* Shoulder internal rotation *Indicates pain, overpressure performed unless otherwise indicated  Accessory Motions/Glides Glenohumeral: Posterior: R: not examined L: normal Inferior: R: not examined L: normal Anterior: R: not examined L: not examined   NEUROLOGICAL:  Mental Status Patient is oriented to person, place and time.  Recent memory is intact.  Remote memory is intact.  Attention span and concentration are intact.  Expressive speech is intact.  Patient's fund of knowledge is within normal limits for educational level.  Cranial Nerves Deferred  Sensation Grossly intact to light touch bilateral UE as determined by testing dermatomes C2-T2 Proprioception and hot/cold testing deferred on this date  Reflexes Deferred  Coordination/Cerebellar Deferred  SPECIAL TESTS Deferred   Trigger Point Dry Needling (TDN), unbilled Education performed with patient regarding potential benefit of TDN. Reviewed precautions and risks with patient. Reviewed special precautions/risks over lung fields  which include pneumothorax. Reviewed signs and symptoms of pneumothorax and advised pt to go to ER immediately if these symptoms develop advise them of dry needling treatment. Extensive time spent with pt to ensure full understanding of TDN risks. Pt provided verbal consent to treatment. TDN performed using clean technique in prone with 1, 0.25 x 60 single needle placement and 1, 0.25 x 40 single needle placement in L upper trap with local twitch response (LTR). Pistoning technique utilized. Pt reports that needling is very uncomfortable and she is sore afterwards.      Pt educated throughout  session about proper posture and technique with exercises. Improved exercise technique, movement at target joints, use of target muscles after min to mod verbal, visual, tactile cues.     Patient arrives with excellent motivation to participate in physical therapy.  Order to assess patient's left shoulder as well as address her BPPV.  Assessed left shoulder during session today.  Patient presents with limited and painful left shoulder active range of motion in all directions.  She has good strength in left shoulder in all directions however resisted strength testing is painful.  Initiated trigger point dry needling during session today to left upper quarter.  Patient is very sore afterwards.  Will assess BPPV once patient is ready to address this issue.  Pt encouraged to continue HEP and follow-up as scheduled. Pt will benefit from PT services to address deficits in back pain in order to return to full function at home.                               PT Short Term Goals - 05/29/21 1513       PT SHORT TERM GOAL #1   Title Pt will be independent with HEP in order to improve strength and decrease back pain in order to improve pain-free function at home.    Time 4    Period Weeks    Status New    Target Date 06/26/21               PT Long Term Goals - 05/29/21 1516       PT LONG TERM GOAL #1   Title Pt will decrease worst back pain as reported on NPRS by at least 2 points in order to demonstrate clinically significant reduction in back pain.    Baseline 05/29/21: worst: 6/10    Time 8    Period Weeks    Status New    Target Date 07/24/21      PT LONG TERM GOAL #2   Title Pt. will report ability to stand for at least 20 minutes without an increase in pain or needing to sit so that she can clean dishes and cook    Baseline 05/29/21: standing endurance 5-10 minutes    Time 8    Period Weeks    Status New    Target Date 07/24/21      PT LONG TERM GOAL #3   Title  Pt. will improve pain-free bilateral hip abduction and adduction strength by at least half a muscle grade to improve pain free functional ability    Baseline 05/29/21: 4-/5 bilaterally with pain    Time 8    Period Weeks    Status New    Target Date 07/24/21      PT LONG TERM GOAL #4   Title Pt will increase FOTO to at least 50 in  order to demonstrate improvement in pain-free function.    Baseline 05/29/21: 40    Time 8    Period Weeks    Status New    Target Date 07/24/21                   Plan - 07/03/21 1636     Clinical Impression Statement Patient arrives with excellent motivation to participate in physical therapy.  Order to assess patient's left shoulder as well as address her BPPV.  Assessed left shoulder during session today.  Patient presents with limited and painful left shoulder active range of motion in all directions.  She has good strength in left shoulder in all directions however resisted strength testing is painful.  Initiated trigger point dry needling during session today to left upper quarter.  Patient is very sore afterwards.  Will assess BPPV once patient is ready to address this issue.  Pt encouraged to continue HEP and follow-up as scheduled. Pt will benefit from PT services to address deficits in back pain in order to return to full function at home.    Personal Factors and Comorbidities Time since onset of injury/illness/exacerbation;Past/Current Experience;Fitness;Comorbidity 3+    Comorbidities COPD, tobacco use, obesity    Examination-Activity Limitations Squat;Stand;Locomotion Level;Sit    Examination-Participation Restrictions Cleaning;Aaron Mose    Stability/Clinical Decision Making Evolving/Moderate complexity    Rehab Potential Fair    PT Frequency 2x / week    PT Duration 8 weeks    PT Treatment/Interventions ADLs/Self Care Home Management;Cryotherapy;Electrical Stimulation;Iontophoresis 4mg /ml Dexamethasone;Moist Heat;Gait training;Stair  training;Functional mobility training;Therapeutic activities;Therapeutic exercise;Balance training;Neuromuscular re-education;Patient/family education;Manual techniques;Dry needling;Aquatic Therapy;Biofeedback;Canalith Repostioning;Passive range of motion;Vestibular;Spinal Manipulations;Joint Manipulations    PT Next Visit Plan Spinal mobilizations soft tissue mobilization, graded strengthening, left shoulder range of motion and strengthening, add to HEP to include left shoulder, treat BPPV once patient is ready    PT Home Exercise Plan Access Code: [email or web address]    Consulted and Agree with Plan of Care Patient              Patient will benefit from skilled therapeutic intervention in order to improve the following deficits and impairments:  Decreased activity tolerance, Decreased range of motion, Decreased strength, Hypomobility, Pain, Impaired flexibility, Postural dysfunction  Visit Diagnosis: Chronic left-sided low back pain without sciatica  Muscle weakness (generalized)  Chronic left shoulder pain     Problem List Patient Active Problem List   Diagnosis Date Noted   Obesity 08/22/2015   Lumbar degenerative disc disease 08/22/2015   GERD (gastroesophageal reflux disease) 04/25/2015   Arthritis 12/20/2014   Hyperlipemia 10/30/2014   Chronic obstructive pulmonary disease (HCC) 03/29/2014   Current tobacco use 03/29/2014   03/31/2014 PT, DPT, GCS  Kylin Genna 07/03/2021, 4:48 PM  [facility] [address] [place], [place], [place] Phone: [phone]   Fax:  [phone]  Name: Kelsey Rose MRN: Kelsey Rose Date of Birth: December 07, 1960

## 2021-07-07 ENCOUNTER — Other Ambulatory Visit: Payer: Self-pay

## 2021-07-07 ENCOUNTER — Ambulatory Visit: Payer: Medicare Other

## 2021-07-07 DIAGNOSIS — M545 Low back pain, unspecified: Secondary | ICD-10-CM | POA: Diagnosis not present

## 2021-07-07 DIAGNOSIS — M6281 Muscle weakness (generalized): Secondary | ICD-10-CM

## 2021-07-07 DIAGNOSIS — G8929 Other chronic pain: Secondary | ICD-10-CM

## 2021-07-07 NOTE — Therapy (Signed)
[facility] [address] [place], [place], [address] Phone: [phone]   Fax:  [phone]  Physical Therapy Treatment  Patient Details  Name: Kelsey Rose MRN: [id number] Date of Birth: [birth year] Referring Provider (PT): Etheleen Nicks NP   Encounter Date: 07/07/2021   PT End of Session - 07/07/21 1507     Visit Number 7    Number of Visits 17    Date for PT Re-Evaluation 07/24/21    Authorization Type eval: 05/29/21    PT Start Time 1450    PT Stop Time 1530    PT Time Calculation (min) 40 min    Activity Tolerance Patient tolerated treatment well    Behavior During Therapy [facility] for tasks assessed/performed              Past Medical History:  Diagnosis Date   Anxiety    Arthritis    Asthma    COPD (chronic obstructive pulmonary disease) (HCC)    Depression    GERD (gastroesophageal reflux disease)     Past Surgical History:  Procedure Laterality Date   ABDOMINAL HYSTERECTOMY  60 yrs old   JOINT REPLACEMENT Right  May 2015   Shoulder replacement   TONSILLECTOMY AND ADENOIDECTOMY  Age 61    There were no vitals filed for this visit.   Subjective Assessment - 07/07/21 1506     Subjective Pt arrives reports 7/10 low back pain. She also continues to have hip pain as well as bilateral shoulder pain. Continues with vertigo when rolling in bed.    Pertinent History Pt referred for chronic L sided low back pain. She reports that the pain is from arthritis in her back and occasionally refers into her left hip.  Pain for started 7 to 8 years ago.  She denies any traumatic onset or back surgeries.  She has tried injections but was allergic to the dye.  She has also tried physical therapy in the past with some benefit.  Most recent lumbar MRI was 06/2015 and showed lower thoracic degenerative disc disease, with advanced changes at T12-L1. L3-4, L4-5, and L5-S1 degenerative disc disease. Mild left neural foraminal narrowing  at L3-4 and L5-S1.  She has a history of R shoulder replacement. No history of L shoulder surgery but complains of regular left shoulder pain. 07/03/21 (L shoulder pain): L shoulder pain x 1 year. Intermittently aggravated prior to that but not consistent. No history of L shoulder surgery/injury. Hx of TSR on RUE. Hx of 2 injections in L shoulder. First injection provided 2 weeks of relief. Most recent injection didn't provide relief. Plain film radiographs of L shoulder show mild glenohumeral osteoarthritis. Calcified loose bodies in the axillary recess. Mild acromioclavicular osteoarthritis. She reports that she has had a L shoulder MRI at [facility] but not available for review by therapist. No prior history of therapy for L shoulder. History of chronic neck pain with some aggravation of L shoulder pain when turning head.    Limitations Standing;Walking;House hold activities;Sitting    How long can you sit comfortably? 30 minutes    How long can you stand comfortably? 5-10 minutes    How long can you walk comfortably? 20 minutes    Diagnostic tests See history    Patient Stated Goals reduce/eliminate pain, be more active                 TREATMENT     Manual Therapy  Extensive STM to lumbar  paraspinals; L shoulder PROM in all direction within pain-free range; L shoulder distraction while gently moving through PROM; L shoulder A/P mobilizations at neutral grade II-III, 30s/bout x 2 bouts; L shoulder A/P mobilizations at 90 abduction and available end range ER II-III, 30s/bout x 2 bouts; L shoulder inferior mobilizations at 90 abduction, grade II-III, 30s/bout x 2 bouts; L shoulder posterior and inferior mobilizations at available end range flexion, grade II-III, 30s/bout x 2 bouts;   Electrical Stimulation  Education performed with patient regarding potential benefit of TDN electrical stimulation. Reviewed precautions and risks with patient. Pt provided verbal consent to treatment.  Utilized E-stim II Unit for dry needling electrical stimulation. Performed bilaterally with two channels. Positive lead at L5 multifidus (0.30 x 79mm needle), negative lead at L3 multifidus (0.30 x 75 mm needle). Asymmetric Biphasic Square Wave Continuous, 280 Microseconds (?S), 2Hz  frequency, at pt tolerated intensity of 2.5 bilaterally x 10 minutes;     Pt educated throughout session about proper posture and technique with exercises. Improved exercise technique, movement at target joints, use of target muscles after min to mod verbal, visual, tactile cues.     Patient arrives with excellent motivation to participate in physical therapy. Performed low back manual techniques as well as L shoulder mobilizations. Also performed low back electrical stimulation trigger point dry needling. Pt encouraged to continue HEP and follow-up as scheduled. Pt will benefit from PT services to address deficits in back pain in order to return to full function at home.                               PT Short Term Goals - 05/29/21 1513       PT SHORT TERM GOAL #1   Title Pt will be independent with HEP in order to improve strength and decrease back pain in order to improve pain-free function at home.    Time 4    Period Weeks    Status New    Target Date 06/26/21               PT Long Term Goals - 05/29/21 1516       PT LONG TERM GOAL #1   Title Pt will decrease worst back pain as reported on NPRS by at least 2 points in order to demonstrate clinically significant reduction in back pain.    Baseline 05/29/21: worst: 6/10    Time 8    Period Weeks    Status New    Target Date 07/24/21      PT LONG TERM GOAL #2   Title Pt. will report ability to stand for at least 20 minutes without an increase in pain or needing to sit so that she can clean dishes and cook    Baseline 05/29/21: standing endurance 5-10 minutes    Time 8    Period Weeks    Status New    Target Date 07/24/21       PT LONG TERM GOAL #3   Title Pt. will improve pain-free bilateral hip abduction and adduction strength by at least half a muscle grade to improve pain free functional ability    Baseline 05/29/21: 4-/5 bilaterally with pain    Time 8    Period Weeks    Status New    Target Date 07/24/21      PT LONG TERM GOAL #4   Title Pt will increase FOTO to at least 50  in order to demonstrate improvement in pain-free function.    Baseline 05/29/21: 40    Time 8    Period Weeks    Status New    Target Date 07/24/21                   Plan - 07/07/21 1507     Clinical Impression Statement Patient arrives with excellent motivation to participate in physical therapy. Performed low back manual techniques as well as L shoulder mobilizations. Also performed low back electrical stimulation trigger point dry needling. Pt encouraged to continue HEP and follow-up as scheduled. Pt will benefit from PT services to address deficits in back pain in order to return to full function at home.    Personal Factors and Comorbidities Time since onset of injury/illness/exacerbation;Past/Current Experience;Fitness;Comorbidity 3+    Comorbidities COPD, tobacco use, obesity    Examination-Activity Limitations Squat;Stand;Locomotion Level;Sit    Examination-Participation Restrictions Cleaning;Aaron Mose    Stability/Clinical Decision Making Evolving/Moderate complexity    Rehab Potential Fair    PT Frequency 2x / week    PT Duration 8 weeks    PT Treatment/Interventions ADLs/Self Care Home Management;Cryotherapy;Electrical Stimulation;Iontophoresis 4mg /ml Dexamethasone;Moist Heat;Gait training;Stair training;Functional mobility training;Therapeutic activities;Therapeutic exercise;Balance training;Neuromuscular re-education;Patient/family education;Manual techniques;Dry needling;Aquatic Therapy;Biofeedback;Canalith Repostioning;Passive range of motion;Vestibular;Spinal Manipulations;Joint Manipulations    PT  Next Visit Plan Spinal mobilizations soft tissue mobilization, graded strengthening, left shoulder range of motion and strengthening, add to HEP to include left shoulder, treat BPPV once patient is ready    PT Home Exercise Plan Access Code: [email or web address]    Consulted and Agree with Plan of Care Patient              Patient will benefit from skilled therapeutic intervention in order to improve the following deficits and impairments:  Decreased activity tolerance, Decreased range of motion, Decreased strength, Hypomobility, Pain, Impaired flexibility, Postural dysfunction  Visit Diagnosis: Chronic left-sided low back pain without sciatica  Muscle weakness (generalized)     Problem List Patient Active Problem List   Diagnosis Date Noted   Obesity 08/22/2015   Lumbar degenerative disc disease 08/22/2015   GERD (gastroesophageal reflux disease) 04/25/2015   Arthritis 12/20/2014   Hyperlipemia 10/30/2014   Chronic obstructive pulmonary disease (HCC) 03/29/2014   Current tobacco use 03/29/2014   03/31/2014 PT, DPT, GCS  Abhinav Mayorquin 07/08/2021, 8:48 PM  [facility] [address] [place], [place], [place] Phone: [phone]   Fax:  [phone]  Name: Kelsey Rose MRN: [name] Date of Birth: Jan 23, 1961

## 2021-07-10 ENCOUNTER — Other Ambulatory Visit: Payer: Self-pay

## 2021-07-10 ENCOUNTER — Ambulatory Visit: Payer: Medicare Other | Attending: Family

## 2021-07-10 DIAGNOSIS — R42 Dizziness and giddiness: Secondary | ICD-10-CM | POA: Diagnosis present

## 2021-07-10 DIAGNOSIS — M6281 Muscle weakness (generalized): Secondary | ICD-10-CM | POA: Insufficient documentation

## 2021-07-10 DIAGNOSIS — G8929 Other chronic pain: Secondary | ICD-10-CM | POA: Insufficient documentation

## 2021-07-10 DIAGNOSIS — M25512 Pain in left shoulder: Secondary | ICD-10-CM | POA: Diagnosis present

## 2021-07-10 NOTE — Therapy (Signed)
[facility] [address] [place], [place], [address] Phone: [phone]   Fax:  [phone]  Physical Therapy Treatment  Patient Details  Name: Kelsey Rose MRN: [id number] Date of Birth: 09-12-1961 Referring Provider (PT): Etheleen Nicks NP   Encounter Date: 07/10/2021   PT End of Session - 07/10/21 1706     Visit Number 8    Number of Visits 17    Date for PT Re-Evaluation 07/24/21    Authorization Type eval: 05/29/21    PT Start Time 1700    PT Stop Time 1745    PT Time Calculation (min) 45 min    Activity Tolerance Patient tolerated treatment well    Behavior During Therapy [facility] for tasks assessed/performed              Past Medical History:  Diagnosis Date   Anxiety    Arthritis    Asthma    COPD (chronic obstructive pulmonary disease) (HCC)    Depression    GERD (gastroesophageal reflux disease)     Past Surgical History:  Procedure Laterality Date   ABDOMINAL HYSTERECTOMY  60 yrs old   JOINT REPLACEMENT Right  May 2015   Shoulder replacement   TONSILLECTOMY AND ADENOIDECTOMY  Age 23    There were no vitals filed for this visit.   Subjective Assessment - 07/10/21 1706     Subjective Pt arrives reporting 8/10 low back pain today and 4/10 L shoulder pain. She would like to focus today's session on her L shoulder. Continues with vertigo when rolling in bed and will come with her daughter next week to address.    Pertinent History Pt referred for chronic L sided low back pain. She reports that the pain is from arthritis in her back and occasionally refers into her left hip.  Pain for started 7 to 8 years ago.  She denies any traumatic onset or back surgeries.  She has tried injections but was allergic to the dye.  She has also tried physical therapy in the past with some benefit.  Most recent lumbar MRI was 06/2015 and showed lower thoracic degenerative disc disease, with advanced changes at T12-L1. L3-4, L4-5,  and L5-S1 degenerative disc disease. Mild left neural foraminal narrowing at L3-4 and L5-S1.  She has a history of R shoulder replacement. No history of L shoulder surgery but complains of regular left shoulder pain. 07/03/21 (L shoulder pain): L shoulder pain x 1 year. Intermittently aggravated prior to that but not consistent. No history of L shoulder surgery/injury. Hx of TSR on RUE. Hx of 2 injections in L shoulder. First injection provided 2 weeks of relief. Most recent injection didn't provide relief. Plain film radiographs of L shoulder show mild glenohumeral osteoarthritis. Calcified loose bodies in the axillary recess. Mild acromioclavicular osteoarthritis. She reports that she has had a L shoulder MRI at [facility] but not available for review by therapist. No prior history of therapy for L shoulder. History of chronic neck pain with some aggravation of L shoulder pain when turning head.    Limitations Standing;Walking;House hold activities;Sitting    How long can you sit comfortably? 30 minutes    How long can you stand comfortably? 5-10 minutes    How long can you walk comfortably? 20 minutes    Diagnostic tests See history    Patient Stated Goals reduce/eliminate pain, be more active  TREATMENT     Manual Therapy  L shoulder PROM in all direction within pain-free range; L shoulder distraction while gently moving through PROM; L shoulder A/P mobilizations at neutral grade I-II, 30s/bout x 3 bouts; L shoulder A/P mobilizations at 90 abduction and available end range ER, grade I-II, 30s/bout x 3 bouts; L shoulder inferior mobilizations at 90 abduction, grade I-II, 30s/bout x 3 bouts; L shoulder posterior and inferior mobilizations at available end range flexion, grade I-II, 30s/bout x 2 bouts; Extensive STM to L anterior and lateral shoulder using BioFreeze as well as massage cream;   Ther-ex  L shoulder serratus punch with manual resistance from therapist 2  x 10; Rhythmic stabilization with L shoulder flexed to 90 degrees and resistance provided at wrist 30s x 2; L shoulder isometrics for flexion, extension, abduction, adduction, IR, and ER 10s hold x 5 each direction;    Pt educated throughout session about proper posture and technique with exercises. Improved exercise technique, movement at target joints, use of target muscles after min to mod verbal, visual, tactile cues.     Patient arrives with excellent motivation to participate in physical therapy however is having a lot of pain. Performed left shoulder manual techniques including shoulder mobilizations and soft tissue mobilization.  Also utilized long hold left shoulder isometrics for pain control.  Patient reports decrease in her left shoulder pain at end of session but does not rate on NPRS.  Patient states that her pain has become worse and therapist encouraged her to contact MD regarding questions related to medication management.  Pt encouraged to continue HEP and follow-up as scheduled. Pt will benefit from PT services to address deficits in back pain, shoulder pain, and vertigo in order to return to full function at home.                               PT Short Term Goals - 05/29/21 1513       PT SHORT TERM GOAL #1   Title Pt will be independent with HEP in order to improve strength and decrease back pain in order to improve pain-free function at home.    Time 4    Period Weeks    Status New    Target Date 06/26/21               PT Long Term Goals - 05/29/21 1516       PT LONG TERM GOAL #1   Title Pt will decrease worst back pain as reported on NPRS by at least 2 points in order to demonstrate clinically significant reduction in back pain.    Baseline 05/29/21: worst: 6/10    Time 8    Period Weeks    Status New    Target Date 07/24/21      PT LONG TERM GOAL #2   Title Pt. will report ability to stand for at least 20 minutes without an increase  in pain or needing to sit so that she can clean dishes and cook    Baseline 05/29/21: standing endurance 5-10 minutes    Time 8    Period Weeks    Status New    Target Date 07/24/21      PT LONG TERM GOAL #3   Title Pt. will improve pain-free bilateral hip abduction and adduction strength by at least half a muscle grade to improve pain free functional ability    Baseline 05/29/21:  4-/5 bilaterally with pain    Time 8    Period Weeks    Status New    Target Date 07/24/21      PT LONG TERM GOAL #4   Title Pt will increase FOTO to at least 50 in order to demonstrate improvement in pain-free function.    Baseline 05/29/21: 40    Time 8    Period Weeks    Status New    Target Date 07/24/21                   Plan - 07/10/21 1707     Clinical Impression Statement Patient arrives with excellent motivation to participate in physical therapy however is having a lot of pain. Performed left shoulder manual techniques including shoulder mobilizations and soft tissue mobilization.  Also utilized long hold left shoulder isometrics for pain control.  Patient reports decrease in her left shoulder pain at end of session but does not rate on NPRS.  Patient states that her pain has become worse and therapist encouraged her to contact MD regarding questions related to medication management.  Pt encouraged to continue HEP and follow-up as scheduled. Pt will benefit from PT services to address deficits in back pain, shoulder pain, and vertigo in order to return to full function at home.    Personal Factors and Comorbidities Time since onset of injury/illness/exacerbation;Past/Current Experience;Fitness;Comorbidity 3+    Comorbidities COPD, tobacco use, obesity    Examination-Activity Limitations Squat;Stand;Locomotion Level;Sit    Examination-Participation Restrictions Cleaning;Aaron Mose    Stability/Clinical Decision Making Evolving/Moderate complexity    Rehab Potential Fair    PT  Frequency 2x / week    PT Duration 8 weeks    PT Treatment/Interventions ADLs/Self Care Home Management;Cryotherapy;Electrical Stimulation;Iontophoresis 4mg /ml Dexamethasone;Moist Heat;Gait training;Stair training;Functional mobility training;Therapeutic activities;Therapeutic exercise;Balance training;Neuromuscular re-education;Patient/family education;Manual techniques;Dry needling;Aquatic Therapy;Biofeedback;Canalith Repostioning;Passive range of motion;Vestibular;Spinal Manipulations;Joint Manipulations    PT Next Visit Plan Spinal mobilizations soft tissue mobilization, graded strengthening, left shoulder range of motion and strengthening, add to HEP to include left shoulder, treat BPPV once patient is ready    PT Home Exercise Plan Access Code: [email or web address]    Consulted and Agree with Plan of Care Patient              Patient will benefit from skilled therapeutic intervention in order to improve the following deficits and impairments:  Decreased activity tolerance, Decreased range of motion, Decreased strength, Hypomobility, Pain, Impaired flexibility, Postural dysfunction  Visit Diagnosis: Chronic left shoulder pain  Muscle weakness (generalized)     Problem List Patient Active Problem List   Diagnosis Date Noted   Obesity 08/22/2015   Lumbar degenerative disc disease 08/22/2015   GERD (gastroesophageal reflux disease) 04/25/2015   Arthritis 12/20/2014   Hyperlipemia 10/30/2014   Chronic obstructive pulmonary disease (HCC) 03/29/2014   Current tobacco use 03/29/2014   03/31/2014 PT, DPT, GCS  Tava Peery 07/11/2021, 8:47 AM  [facility] [address] [place], [place], [place] Phone: [phone]   Fax:  [phone]  Name: Kelsey Rose MRN: [name] Date of Birth: Feb 23, 1961

## 2021-07-17 ENCOUNTER — Other Ambulatory Visit: Payer: Self-pay

## 2021-07-17 ENCOUNTER — Ambulatory Visit: Payer: Medicare Other

## 2021-07-17 DIAGNOSIS — M6281 Muscle weakness (generalized): Secondary | ICD-10-CM

## 2021-07-17 DIAGNOSIS — M25512 Pain in left shoulder: Secondary | ICD-10-CM | POA: Diagnosis not present

## 2021-07-17 DIAGNOSIS — G8929 Other chronic pain: Secondary | ICD-10-CM

## 2021-07-18 NOTE — Therapy (Signed)
[facility] [address] [place], [place], [address] Phone: [phone]   Fax:  [phone]  Physical Therapy Treatment  Patient Details  Name: Kelsey Rose MRN: [id number] Date of Birth: 02/01/1961 Referring Provider (PT): Etheleen Nicks NP   Encounter Date: 07/17/2021   PT End of Session - 07/17/21 1644     Visit Number 9    Number of Visits 17    Date for PT Re-Evaluation 07/24/21    Authorization Type eval: 05/29/21    PT Start Time 1618    PT Stop Time 1700    PT Time Calculation (min) 42 min    Activity Tolerance Patient tolerated treatment well    Behavior During Therapy [facility] for tasks assessed/performed              Past Medical History:  Diagnosis Date   Anxiety    Arthritis    Asthma    COPD (chronic obstructive pulmonary disease) (HCC)    Depression    GERD (gastroesophageal reflux disease)     Past Surgical History:  Procedure Laterality Date   ABDOMINAL HYSTERECTOMY  60 yrs old   JOINT REPLACEMENT Right  May 2015   Shoulder replacement   TONSILLECTOMY AND ADENOIDECTOMY  Age 15    There were no vitals filed for this visit.   Subjective Assessment - 07/17/21 1644     Subjective Pt arrives reporting 8/10 L shoulder pain. She would like to focus today's session on her L shoulder. Continues with vertigo but does not have someone to drive her home today. No specific questions or concerns.    Pertinent History Pt referred for chronic L sided low back pain. She reports that the pain is from arthritis in her back and occasionally refers into her left hip.  Pain for started 7 to 8 years ago.  She denies any traumatic onset or back surgeries.  She has tried injections but was allergic to the dye.  She has also tried physical therapy in the past with some benefit.  Most recent lumbar MRI was 06/2015 and showed lower thoracic degenerative disc disease, with advanced changes at T12-L1. L3-4, L4-5, and L5-S1  degenerative disc disease. Mild left neural foraminal narrowing at L3-4 and L5-S1.  She has a history of R shoulder replacement. No history of L shoulder surgery but complains of regular left shoulder pain. 07/03/21 (L shoulder pain): L shoulder pain x 1 year. Intermittently aggravated prior to that but not consistent. No history of L shoulder surgery/injury. Hx of TSR on RUE. Hx of 2 injections in L shoulder. First injection provided 2 weeks of relief. Most recent injection didn't provide relief. Plain film radiographs of L shoulder show mild glenohumeral osteoarthritis. Calcified loose bodies in the axillary recess. Mild acromioclavicular osteoarthritis. She reports that she has had a L shoulder MRI at [facility] but not available for review by therapist. No prior history of therapy for L shoulder. History of chronic neck pain with some aggravation of L shoulder pain when turning head.    Limitations Standing;Walking;House hold activities;Sitting    How long can you sit comfortably? 30 minutes    How long can you stand comfortably? 5-10 minutes    How long can you walk comfortably? 20 minutes    Diagnostic tests See history    Patient Stated Goals reduce/eliminate pain, be more active                 TREATMENT  Manual Therapy  L shoulder PROM in all direction within pain-free range; L shoulder distraction while gently moving through PROM; L shoulder A/P mobilizations at neutral grade I-II, 30s/bout x 3 bouts; L shoulder A/P mobilizations at 90 abduction and available end range ER, grade I-II, 30s/bout x 3 bouts; L shoulder inferior mobilizations at 90 abduction, grade I-II, 30s/bout x 3 bouts; L shoulder posterior and inferior mobilizations at available end range flexion, grade I-II, 30s/bout x 2 bouts; Extensive STM to L anterior and lateral shoulder with massage cream;   Ther-ex  L shoulder serratus punch with manual resistance from therapist 2 x 10; Rhythmic stabilization  with L shoulder flexed to 90 degrees and resistance provided at wrist 30s x 2; L shoulder isometrics for flexion, extension, abduction, adduction, IR, and ER 5s hold x 10 each direction;    Pt educated throughout session about proper posture and technique with exercises. Improved exercise technique, movement at target joints, use of target muscles after min to mod verbal, visual, tactile cues.     Patient arrives with excellent motivation to participate in physical therapy however is having a lot of L shoulder pain today. Performed left shoulder manual techniques including shoulder mobilizations and soft tissue mobilization.  Also utilized left shoulder isometrics for pain control.  Patient reports decrease in her left shoulder pain at end of session but does not rate on NPRS.  Patient still has not contacted her MD regarding medication management for her pain. She did not bring anyone to drive her home so vertigo not treated today. Pt encouraged to continue HEP and follow-up as scheduled. Pt will benefit from PT services to address deficits in back pain, shoulder pain, and vertigo in order to return to full function at home.                               PT Short Term Goals - 05/29/21 1513       PT SHORT TERM GOAL #1   Title Pt will be independent with HEP in order to improve strength and decrease back pain in order to improve pain-free function at home.    Time 4    Period Weeks    Status New    Target Date 06/26/21               PT Long Term Goals - 05/29/21 1516       PT LONG TERM GOAL #1   Title Pt will decrease worst back pain as reported on NPRS by at least 2 points in order to demonstrate clinically significant reduction in back pain.    Baseline 05/29/21: worst: 6/10    Time 8    Period Weeks    Status New    Target Date 07/24/21      PT LONG TERM GOAL #2   Title Pt. will report ability to stand for at least 20 minutes without an increase in pain  or needing to sit so that she can clean dishes and cook    Baseline 05/29/21: standing endurance 5-10 minutes    Time 8    Period Weeks    Status New    Target Date 07/24/21      PT LONG TERM GOAL #3   Title Pt. will improve pain-free bilateral hip abduction and adduction strength by at least half a muscle grade to improve pain free functional ability    Baseline 05/29/21: 4-/5 bilaterally with  pain    Time 8    Period Weeks    Status New    Target Date 07/24/21      PT LONG TERM GOAL #4   Title Pt will increase FOTO to at least 50 in order to demonstrate improvement in pain-free function.    Baseline 05/29/21: 40    Time 8    Period Weeks    Status New    Target Date 07/24/21                   Plan - 07/18/21 1132     Clinical Impression Statement Patient arrives with excellent motivation to participate in physical therapy however is having a lot of L shoulder pain today. Performed left shoulder manual techniques including shoulder mobilizations and soft tissue mobilization.  Also utilized left shoulder isometrics for pain control.  Patient reports decrease in her left shoulder pain at end of session but does not rate on NPRS.  Patient still has not contacted her MD regarding medication management for her pain. She did not bring anyone to drive her home so vertigo not treated today. Pt encouraged to continue HEP and follow-up as scheduled. Pt will benefit from PT services to address deficits in back pain, shoulder pain, and vertigo in order to return to full function at home.    Personal Factors and Comorbidities Time since onset of injury/illness/exacerbation;Past/Current Experience;Fitness;Comorbidity 3+    Comorbidities COPD, tobacco use, obesity    Examination-Activity Limitations Squat;Stand;Locomotion Level;Sit    Examination-Participation Restrictions Cleaning;Aaron Mose    Stability/Clinical Decision Making Evolving/Moderate complexity    Rehab Potential Fair     PT Frequency 2x / week    PT Duration 8 weeks    PT Treatment/Interventions ADLs/Self Care Home Management;Cryotherapy;Electrical Stimulation;Iontophoresis 4mg /ml Dexamethasone;Moist Heat;Gait training;Stair training;Functional mobility training;Therapeutic activities;Therapeutic exercise;Balance training;Neuromuscular re-education;Patient/family education;Manual techniques;Dry needling;Aquatic Therapy;Biofeedback;Canalith Repostioning;Passive range of motion;Vestibular;Spinal Manipulations;Joint Manipulations    PT Next Visit Plan Spinal mobilizations soft tissue mobilization, graded strengthening, left shoulder range of motion and strengthening, add to HEP to include left shoulder, treat BPPV once patient is ready    PT Home Exercise Plan Access Code: [email or web address]    Consulted and Agree with Plan of Care Patient              Patient will benefit from skilled therapeutic intervention in order to improve the following deficits and impairments:  Decreased activity tolerance, Decreased range of motion, Decreased strength, Hypomobility, Pain, Impaired flexibility, Postural dysfunction  Visit Diagnosis: Muscle weakness (generalized)  Chronic left shoulder pain     Problem List Patient Active Problem List   Diagnosis Date Noted   Obesity 08/22/2015   Lumbar degenerative disc disease 08/22/2015   GERD (gastroesophageal reflux disease) 04/25/2015   Arthritis 12/20/2014   Hyperlipemia 10/30/2014   Chronic obstructive pulmonary disease (HCC) 03/29/2014   Current tobacco use 03/29/2014   03/31/2014 PT, DPT, GCS  Brande Uncapher 07/18/2021, 11:35 AM   [facility] [address] [place], [place], [place] Phone: [phone]   Fax:  [phone]  Name: Kelsey Rose MRN: [name] Date of Birth: 03/19/1961

## 2021-07-29 ENCOUNTER — Ambulatory Visit: Payer: Medicare Other

## 2021-07-29 ENCOUNTER — Other Ambulatory Visit: Payer: Self-pay

## 2021-07-29 DIAGNOSIS — M6281 Muscle weakness (generalized): Secondary | ICD-10-CM

## 2021-07-29 DIAGNOSIS — G8929 Other chronic pain: Secondary | ICD-10-CM

## 2021-07-29 DIAGNOSIS — M25512 Pain in left shoulder: Secondary | ICD-10-CM

## 2021-07-29 NOTE — Therapy (Addendum)
[facility] [address] [place], [place], [address] Phone: [phone]   Fax:  [phone]  Physical Therapy Treatment/Progress Note   Dates of reporting period  05/29/21   to   07/24/21  Patient Details  Name: Kelsey Rose MRN: [id number] Date of Birth: 06-16-1961 Referring Provider (PT): Verita Lamb NP   Encounter Date: 07/29/2021   PT End of Session - 07/29/21 1323     Visit Number 10    Number of Visits 17    Date for PT Re-Evaluation 09/23/21   Authorization Type eval: 05/29/21    PT Start Time 1316    PT Stop Time 1400    PT Time Calculation (min) 44 min    Activity Tolerance Patient tolerated treatment well    Behavior During Therapy [facility] for tasks assessed/performed              Past Medical History:  Diagnosis Date   Anxiety    Arthritis    Asthma    COPD (chronic obstructive pulmonary disease) ([place])    Depression    GERD (gastroesophageal reflux disease)     Past Surgical History:  Procedure Laterality Date   ABDOMINAL HYSTERECTOMY  60 yrs old   JOINT REPLACEMENT Right  May 2015   Shoulder replacement   TONSILLECTOMY AND ADENOIDECTOMY  Age 27    There were no vitals filed for this visit.   Subjective Assessment - 07/29/21 1323     Subjective Pt arrives reporting 4/10 low back pain upon arrival. Denies resting L shoulder pain currently but would like to work on L shoulder mobility and strengthening. No specific questions or concerns.    Pertinent History Pt referred for chronic L sided low back pain. She reports that the pain is from arthritis in her back and occasionally refers into her left hip.  Pain for started 7 to 8 years ago.  She denies any traumatic onset or back surgeries.  She has tried injections but was allergic to the dye.  She has also tried physical therapy in the past with some benefit.  Most recent lumbar MRI was 06/2015 and showed lower thoracic degenerative disc disease, with  advanced changes at T12-L1. L3-4, L4-5, and L5-S1 degenerative disc disease. Mild left neural foraminal narrowing at L3-4 and L5-S1.  She has a history of R shoulder replacement. No history of L shoulder surgery but complains of regular left shoulder pain. 07/03/21 (L shoulder pain): L shoulder pain x 1 year. Intermittently aggravated prior to that but not consistent. No history of L shoulder surgery/injury. Hx of TSR on RUE. Hx of 2 injections in L shoulder. First injection provided 2 weeks of relief. Most recent injection didn't provide relief. Plain film radiographs of L shoulder show mild glenohumeral osteoarthritis. Calcified loose bodies in the axillary recess. Mild acromioclavicular osteoarthritis. She reports that she has had a L shoulder MRI at [facility] but not available for review by therapist. No prior history of therapy for L shoulder. History of chronic neck pain with some aggravation of L shoulder pain when turning head.    Limitations Standing;Walking;House hold activities;Sitting    How long can you sit comfortably? 30 minutes    How long can you stand comfortably? 5-10 minutes    How long can you walk comfortably? 20 minutes    Diagnostic tests See history    Patient Stated Goals reduce/eliminate pain, be more active  TREATMENT     Manual Therapy  L shoulder PROM in all direction within pain-free range; L shoulder distraction while gently moving through PROM; L shoulder A/P mobilizations at neutral grade I-II, 30s/bout x 3 bouts; L shoulder A/P mobilizations at 90 abduction and available end range ER, grade I-II, 30s/bout x 3 bouts; L shoulder inferior mobilizations at 90 abduction, grade I-II, 30s/bout x 3 bouts; L shoulder posterior and inferior mobilizations at available end range flexion, grade I-II, 30s/bout x 2 bouts; Supine R single knee to chest, FABER, and FADIR stretches x 30s each;    Ther-ex  Supine L shoulder serratus punch with manual  resistance from therapist 2 x 10; Supine rhythmic stabilization with L shoulder flexed to 90 degrees and resistance provided at wrist 30s x 2; Supine L shoulder flexion from neutral to 90 degrees with manual resistance from therapist 2 x 10; L shoulder isometrics for flexion, extension, abduction, adduction, IR, and ER 5s hold x 10 each direction; Hooklying lumbar rotation rocking x 1 minute;     Pt educated throughout session about proper posture and technique with exercises. Improved exercise technique, movement at target joints, use of target muscles after min to mod verbal, visual, tactile cues.     Patient arrives with excellent motivation to participate in physical therapy.  She is not having left shoulder pain upon arrival however would like to work on left shoulder mobility and strengthening.  Performed left shoulder manual techniques including shoulder mobilizations. Also utilized left shoulder isometrics for pain control. She did not bring anyone to drive her home so vertigo not treated today however she reports that her daughter is coming with her on Thursday.  Pt encouraged to continue HEP and follow-up as scheduled. Pt will benefit from PT services to address deficits in back pain, shoulder pain, and vertigo in order to return to full function at home.                               PT Short Term Goals - 05/29/21 1513       PT SHORT TERM GOAL #1   Title Pt will be independent with HEP in order to improve strength and decrease back pain in order to improve pain-free function at home.    Time 4    Period Weeks    Status Achieved   Target Date               PT Long Term Goals - 07/29/21 1324       PT LONG TERM GOAL #1   Title Pt will decrease worst back pain as reported on NPRS by at least 2 points in order to demonstrate clinically significant reduction in back pain.    Baseline 05/29/21: worst: 6/10; 07/29/21: 8/10;    Time 8    Period Weeks     Status On-going    Target Date 09/23/21      PT LONG TERM GOAL #2   Title Pt. will report ability to stand for at least 20 minutes without an increase in pain or needing to sit so that she can clean dishes and cook    Baseline 05/29/21: standing endurance 5-10 minutes; 07/29/21: varies from day to day, but at least 10 minutes    Time 8    Period Weeks    Status Partially Met    Target Date 09/23/21      PT LONG TERM GOAL #3  Title Pt. will improve pain-free bilateral hip abduction and adduction strength by at least half a muscle grade to improve pain free functional ability    Baseline 05/29/21: 4-/5 bilaterally with pain; 07/29/21: Deferred   Time 8    Period Weeks    Status Deferred       PT LONG TERM GOAL #4   Title Pt will increase FOTO to at least 50 in order to demonstrate improvement in pain-free function.    Baseline 05/29/21: 40; 07/29/21: 40   Time 8    Period Weeks    Status Ongoing                   Plan - 07/29/21 1324     Clinical Impression Statement Patient arrives with excellent motivation to participate in physical therapy.  She is not having left shoulder pain upon arrival however would like to work on left shoulder mobility and strengthening.  Performed left shoulder manual techniques including shoulder mobilizations. Also utilized left shoulder isometrics for pain control. She did not bring anyone to drive her home so vertigo not treated today however she reports that her daughter is coming with her on Thursday.  Pt encouraged to continue HEP and follow-up as scheduled. Pt will benefit from PT services to address deficits in back pain, shoulder pain, and vertigo in order to return to full function at home.    Personal Factors and Comorbidities Time since onset of injury/illness/exacerbation;Past/Current Experience;Fitness;Comorbidity 3+    Comorbidities COPD, tobacco use, obesity    Examination-Activity Limitations Squat;Stand;Locomotion Level;Sit     Examination-Participation Restrictions Cleaning;Dorita Sciara    Stability/Clinical Decision Making Evolving/Moderate complexity    Rehab Potential Fair    PT Frequency 2x / week    PT Duration 8 weeks    PT Treatment/Interventions ADLs/Self Care Home Management;Cryotherapy;Electrical Stimulation;Iontophoresis 24m/ml Dexamethasone;Moist Heat;Gait training;Stair training;Functional mobility training;Therapeutic activities;Therapeutic exercise;Balance training;Neuromuscular re-education;Patient/family education;Manual techniques;Dry needling;Aquatic Therapy;Biofeedback;Canalith Repostioning;Passive range of motion;Vestibular;Spinal Manipulations;Joint Manipulations    PT Next Visit Plan Spinal mobilizations soft tissue mobilization, graded strengthening, left shoulder range of motion and strengthening, add to HEP to include left shoulder, treat BPPV once patient is ready    PT Home Exercise Plan Access Code: [email or web address]    Consulted and Agree with Plan of Care Patient              Patient will benefit from skilled therapeutic intervention in order to improve the following deficits and impairments:  Decreased activity tolerance, Decreased range of motion, Decreased strength, Hypomobility, Pain, Impaired flexibility, Postural dysfunction  Visit Diagnosis: Muscle weakness (generalized)  Chronic left shoulder pain     Problem List Patient Active Problem List   Diagnosis Date Noted   Obesity 08/22/2015   Lumbar degenerative disc disease 08/22/2015   GERD (gastroesophageal reflux disease) 04/25/2015   Arthritis 12/20/2014   Hyperlipemia 10/30/2014   Chronic obstructive pulmonary disease ([place] 03/29/2014   Current tobacco use 03/29/2014   JPhillips GroutPT, DPT, GCS  Angelo Caroll 07/29/2021, 4:14 PM   [facility] Healthple[address] [place] [address]Phone: [phone]  Fax:  [phone] Name: GBronte SabadoMRN:  [id number] of Birth: 827-Mar-1962

## 2021-07-31 ENCOUNTER — Other Ambulatory Visit: Payer: Self-pay

## 2021-07-31 ENCOUNTER — Ambulatory Visit: Payer: Medicare Other

## 2021-07-31 DIAGNOSIS — M25512 Pain in left shoulder: Secondary | ICD-10-CM | POA: Diagnosis not present

## 2021-07-31 DIAGNOSIS — R42 Dizziness and giddiness: Secondary | ICD-10-CM

## 2021-07-31 DIAGNOSIS — G8929 Other chronic pain: Secondary | ICD-10-CM

## 2021-07-31 NOTE — Therapy (Signed)
[facility] [address] [place], [place], [address] Phone: [phone]   Fax:  [phone]  Physical Therapy Treatment  Patient Details  Name: Kelsey Rose MRN: [id number] Date of Birth: [birth year] Referring Provider (PT): Verita Lamb NP   Encounter Date: 07/31/2021   PT End of Session - 07/31/21 1601     Visit Number 11    Number of Visits 17    Date for PT Re-Evaluation 07/24/21    Authorization Type eval: 05/29/21    PT Start Time 1315    PT Stop Time 1400    PT Time Calculation (min) 45 min    Activity Tolerance Patient tolerated treatment well    Behavior During Therapy [facility] for tasks assessed/performed              Past Medical History:  Diagnosis Date   Anxiety    Arthritis    Asthma    COPD (chronic obstructive pulmonary disease) ([place])    Depression    GERD (gastroesophageal reflux disease)     Past Surgical History:  Procedure Laterality Date   ABDOMINAL HYSTERECTOMY  60 yrs old   JOINT REPLACEMENT Right  [place] 2015   Shoulder replacement   TONSILLECTOMY AND ADENOIDECTOMY  Age 12    There were no vitals filed for this visit.   Subjective Assessment - 07/31/21 1600     Subjective Pt arrives reporting continued chronic low back and L shoulder pain upon arrival.  Her daughter was unable to arrive on time to bring her therapy sessions so she drove herself.  Would like to treat vertigo during the next session when her daughter can come with her.  No specific questions or concerns.    Pertinent History Pt referred for chronic L sided low back pain. She reports that the pain is from arthritis in her back and occasionally refers into her left hip.  Pain for started 7 to 8 years ago.  She denies any traumatic onset or back surgeries.  She has tried injections but was allergic to the dye.  She has also tried physical therapy in the past with some benefit.  Most recent lumbar MRI was 06/2015 and showed lower  thoracic degenerative disc disease, with advanced changes at T12-L1. L3-4, L4-5, and L5-S1 degenerative disc disease. Mild left neural foraminal narrowing at L3-4 and L5-S1.  She has a history of R shoulder replacement. No history of L shoulder surgery but complains of regular left shoulder pain. 07/03/21 (L shoulder pain): L shoulder pain x 1 year. Intermittently aggravated prior to that but not consistent. No history of L shoulder surgery/injury. Hx of TSR on RUE. Hx of 2 injections in L shoulder. First injection provided 2 weeks of relief. Most recent injection didn't provide relief. Plain film radiographs of L shoulder show mild glenohumeral osteoarthritis. Calcified loose bodies in the axillary recess. Mild acromioclavicular osteoarthritis. She reports that she has had a L shoulder MRI at [facility] but not available for review by therapist. No prior history of therapy for L shoulder. History of chronic neck pain with some aggravation of L shoulder pain when turning head.    Limitations Standing;Walking;House hold activities;Sitting    How long can you sit comfortably? 30 minutes    How long can you stand comfortably? 5-10 minutes    How long can you walk comfortably? 20 minutes    Diagnostic tests See history    Patient Stated Goals reduce/eliminate pain, be more active  TREATMENT    Neuromuscular Re-education   Performed left Dix-Hallpike test which is positive for vigorous up beating left torsional nystagmus with severe vertigo and nausea.  5-second latency prior to onset of symptoms once in position.  Patient unable to tolerate remaining in position has to sit up due to nausea.  Repeated Dix-Hallpike on the right side which is positive for mild sensation of dizziness with downbeating right torsional nystagmus likely as a result of left posterior canal BPPV.  Patient unable to tolerate CRT today.  Extensive education performed patient regarding etiology of BPPV as well  as treatment.  Letter provided to patient to give to her PCP asking about prescription for antiemetic prior to next treatment session.   Manual Therapy  L shoulder PROM in all direction within pain-free range; L shoulder distraction while gently moving through PROM; L shoulder A/P mobilizations at neutral grade I-II, 30s/bout x 3 bouts; L shoulder A/P mobilizations at 90 abduction and available end range ER, grade I-II, 30s/bout x 3 bouts; L shoulder inferior mobilizations at 90 abduction, grade I-II, 30s/bout x 3 bouts; L shoulder posterior and inferior mobilizations at available end range flexion, grade I-II, 30s/bout x 2 bouts; STM to L anterior and lateral shoulder;    Pt educated throughout session about proper posture and technique with exercises. Improved exercise technique, movement at target joints, use of target muscles after min to mod verbal, visual, tactile cues.     Patient arrives with excellent motivation to participate in physical therapy.  Her daughter was unable to bring her today so she is unable to participate in CRT. Performed left Dix-Hallpike test which is positive for vigorous up beating left torsional nystagmus with severe vertigo and nausea.  5-second latency prior to onset of symptoms once in position.  Patient unable to tolerate remaining in position and has to sit up due to nausea.  Repeated Dix-Hallpike on the right side which is positive for mild sensation of dizziness with downbeating right torsional nystagmus likely as a result of left posterior canal BPPV.  Patient unable to tolerate CRT today.  Extensive education performed patient regarding etiology of BPPV as well as treatment.  Letter provided to patient to give to her PCP asking about prescription for antiemetic prior to next treatment session. Performed left shoulder manual techniques including shoulder passive accessory and soft tissue mobilization.  Pt encouraged to continue HEP and follow-up as scheduled.   She will benefit from PT services to address deficits in back pain, shoulder pain, and vertigo in order to return to full function at home.                               PT Short Term Goals - 05/29/21 1513       PT SHORT TERM GOAL #1   Title Pt will be independent with HEP in order to improve strength and decrease back pain in order to improve pain-free function at home.    Time 4    Period Weeks    Status New    Target Date 06/26/21               PT Long Term Goals - 07/29/21 1324       PT LONG TERM GOAL #1   Title Pt will decrease worst back pain as reported on NPRS by at least 2 points in order to demonstrate clinically significant reduction in back pain.    Baseline  05/29/21: worst: 6/10; 07/29/21: 8/10;    Time 8    Period Weeks    Status On-going    Target Date 09/23/21      PT LONG TERM GOAL #2   Title Pt. will report ability to stand for at least 20 minutes without an increase in pain or needing to sit so that she can clean dishes and cook    Baseline 05/29/21: standing endurance 5-10 minutes; 07/29/21: varies from day to day, but at least 10 minutes    Time 8    Period Weeks    Status Partially Met    Target Date 09/23/21      PT LONG TERM GOAL #3   Title Pt. will improve pain-free bilateral hip abduction and adduction strength by at least half a muscle grade to improve pain free functional ability    Baseline 05/29/21: 4-/5 bilaterally with pain; 07/29/21:    Time 8    Period Weeks    Status New      PT LONG TERM GOAL #4   Title Pt will increase FOTO to at least 50 in order to demonstrate improvement in pain-free function.    Baseline 05/29/21: 40; 07/29/21:    Time 8    Period Weeks    Status New                   Plan - 07/31/21 1602     Clinical Impression Statement Patient arrives with excellent motivation to participate in physical therapy.  Her daughter was unable to bring her today so she is unable to participate in  CRT. Performed left Dix-Hallpike test which is positive for vigorous up beating left torsional nystagmus with severe vertigo and nausea.  5-second latency prior to onset of symptoms once in position.  Patient unable to tolerate remaining in position and has to sit up due to nausea.  Repeated Dix-Hallpike on the right side which is positive for mild sensation of dizziness with downbeating right torsional nystagmus likely as a result of left posterior canal BPPV.  Patient unable to tolerate CRT today.  Extensive education performed patient regarding etiology of BPPV as well as treatment.  Letter provided to patient to give to her PCP asking about prescription for antiemetic prior to next treatment session. Performed left shoulder manual techniques including shoulder passive accessory and soft tissue mobilization.  Pt encouraged to continue HEP and follow-up as scheduled.  She will benefit from PT services to address deficits in back pain, shoulder pain, and vertigo in order to return to full function at home.    Personal Factors and Comorbidities Time since onset of injury/illness/exacerbation;Past/Current Experience;Fitness;Comorbidity 3+    Comorbidities COPD, tobacco use, obesity    Examination-Activity Limitations Squat;Stand;Locomotion Level;Sit    Examination-Participation Restrictions Cleaning;Dorita Sciara    Stability/Clinical Decision Making Evolving/Moderate complexity    Rehab Potential Fair    PT Frequency 2x / week    PT Duration 8 weeks    PT Treatment/Interventions ADLs/Self Care Home Management;Cryotherapy;Electrical Stimulation;Iontophoresis 4mg /ml Dexamethasone;Moist Heat;Gait training;Stair training;Functional mobility training;Therapeutic activities;Therapeutic exercise;Balance training;Neuromuscular re-education;Patient/family education;Manual techniques;Dry needling;Aquatic Therapy;Biofeedback;Canalith Repostioning;Passive range of motion;Vestibular;Spinal Manipulations;Joint  Manipulations    PT Next Visit Plan Spinal mobilizations soft tissue mobilization, graded strengthening, left shoulder range of motion and strengthening, add to HEP to include left shoulder, treat BPPV once patient is ready    PT Home Exercise Plan Access Code: [email or web address]    Consulted and Agree with Plan of Care Patient  Patient will benefit from skilled therapeutic intervention in order to improve the following deficits and impairments:  Decreased activity tolerance, Decreased range of motion, Decreased strength, Hypomobility, Pain, Impaired flexibility, Postural dysfunction  Visit Diagnosis: Chronic left shoulder pain  Dizziness and giddiness     Problem List Patient Active Problem List   Diagnosis Date Noted   Obesity 08/22/2015   Lumbar degenerative disc disease 08/22/2015   GERD (gastroesophageal reflux disease) 04/25/2015   Arthritis 12/20/2014   Hyperlipemia 10/30/2014   Chronic obstructive pulmonary disease ([facility]) 03/29/2014   Current tobacco use 03/29/2014   Phillips Grout PT, DPT, GCS  Hawraa Stambaugh 07/31/2021, 4:13 PM  [facility] [address] [place], [place], [address] Phone: [phone]   Fax:  [phone]  Name: Kelsey Rose MRN: [id number] Date of Birth: 1961/01/01

## 2021-08-05 ENCOUNTER — Ambulatory Visit: Payer: Medicare Other

## 2021-08-05 ENCOUNTER — Other Ambulatory Visit: Payer: Self-pay

## 2021-08-05 DIAGNOSIS — G8929 Other chronic pain: Secondary | ICD-10-CM

## 2021-08-05 DIAGNOSIS — R42 Dizziness and giddiness: Secondary | ICD-10-CM

## 2021-08-05 DIAGNOSIS — M6281 Muscle weakness (generalized): Secondary | ICD-10-CM

## 2021-08-05 DIAGNOSIS — M25512 Pain in left shoulder: Secondary | ICD-10-CM

## 2021-08-05 NOTE — Addendum Note (Signed)
Addended by: Ria Comment D on: 08/05/2021 06:12 PM   Modules accepted: Orders

## 2021-08-05 NOTE — Therapy (Signed)
[facility] [address] [place], [place], [address] Phone: [phone]   Fax:  [phone]  Physical Therapy Treatment  Patient Details  Name: Kelsey Rose MRN: [id number] Date of Birth: 12-15-1960 Referring Provider (PT): Verita Lamb NP   Encounter Date: 08/05/2021   PT End of Session - 08/05/21 1804     Visit Number 12    Number of Visits 33    Date for PT Re-Evaluation 09/23/21    Authorization Type eval: 05/29/21    PT Start Time 3220    PT Stop Time 1400    PT Time Calculation (min) 43 min    Activity Tolerance Patient tolerated treatment well    Behavior During Therapy [facility] for tasks assessed/performed               Past Medical History:  Diagnosis Date   Anxiety    Arthritis    Asthma    COPD (chronic obstructive pulmonary disease) ([place])    Depression    GERD (gastroesophageal reflux disease)     Past Surgical History:  Procedure Laterality Date   ABDOMINAL HYSTERECTOMY  60 yrs old   JOINT REPLACEMENT Right  May 2015   Shoulder replacement   TONSILLECTOMY AND ADENOIDECTOMY  Age 60    There were no vitals filed for this visit.   Subjective Assessment - 08/05/21 1803     Subjective Patient reports she is doing all right today.  She saw her PCP who prescribed ondansetron to help with her nausea during BPPV treatment.  Patient took a tablet at noon today.  She would like to try treating her vertigo today.    Pertinent History Pt referred for chronic L sided low back pain. She reports that the pain is from arthritis in her back and occasionally refers into her left hip.  Pain for started 7 to 8 years ago.  She denies any traumatic onset or back surgeries.  She has tried injections but was allergic to the dye.  She has also tried physical therapy in the past with some benefit.  Most recent lumbar MRI was 06/2015 and showed lower thoracic degenerative disc disease, with advanced changes at T12-L1. L3-4, L4-5,  and L5-S1 degenerative disc disease. Mild left neural foraminal narrowing at L3-4 and L5-S1.  She has a history of R shoulder replacement. No history of L shoulder surgery but complains of regular left shoulder pain. 07/03/21 (L shoulder pain): L shoulder pain x 1 year. Intermittently aggravated prior to that but not consistent. No history of L shoulder surgery/injury. Hx of TSR on RUE. Hx of 2 injections in L shoulder. First injection provided 2 weeks of relief. Most recent injection didn't provide relief. Plain film radiographs of L shoulder show mild glenohumeral osteoarthritis. Calcified loose bodies in the axillary recess. Mild acromioclavicular osteoarthritis. She reports that she has had a L shoulder MRI at [facility] but not available for review by therapist. No prior history of therapy for L shoulder. History of chronic neck pain with some aggravation of L shoulder pain when turning head.    Limitations Standing;Walking;House hold activities;Sitting    How long can you sit comfortably? 30 minutes    How long can you stand comfortably? 5-10 minutes    How long can you walk comfortably? 20 minutes    Diagnostic tests See history    Patient Stated Goals reduce/eliminate pain, be more active  TREATMENT    Neuromuscular Re-education   Performed left Dix-Hallpike test which is positive for vigorous up beating left torsional nystagmus with severe vertigo and nausea.  5-second latency prior to onset of symptoms once in position.  Patient unable to tolerate remaining in position has to sit up due to nausea. Attempted again with similar response and once pt sits up she vomits. After a rest attempted one final time however once again pt has to sit up once vertigo starts due to severe nausea. Pt plans to take her antiemetic next time in addition to dramamine to help with the nausea.    Manual Therapy  L shoulder PROM in all direction within pain-free range; L shoulder  distraction while gently moving through PROM; L shoulder A/P mobilizations at neutral grade I-II, 30s/bout x 2 bouts; L shoulder A/P mobilizations at 90 abduction and available end range ER, grade I-II, 30s/bout x 2 bouts; L shoulder inferior mobilizations at 90 abduction, grade I-II, 30s/bout x 2 bouts;   Ther-ex  Supine L shoulder serratus punch with manual resistance 2 x 10; Supine left shoulder rhythmic stabilization at 9 degrees of flexion 2 x 30 seconds; Supine left shoulder mainly resisted flexion from neutral to 90 degrees 2x10; Supine left shoulder manually resisted abduction from neutral 9 degrees x 10; Supine left shoulder manual resisted IR and ER x10 each; Supine left shoulder mainly resisted adduction x10;    Pt educated throughout session about proper posture and technique with exercises. Improved exercise technique, movement at target joints, use of target muscles after min to mod verbal, visual, tactile cues.     Patient arrives with excellent motivation to participate in physical therapy.  Performed left Dix-Hallpike test which is positive for vigorous up beating left torsional nystagmus with severe vertigo and nausea.  5-second latency prior to onset of symptoms once in position.  Patient unable to tolerate remaining in position has to sit up due to nausea. Attempted again with similar response and once pt sits up she vomits. After a rest attempted one final time however once again pt has to sit up once vertigo starts due to severe nausea. Pt plans to take her antiemetic next time in addition to dramamine to help with the nausea. Performed left shoulder manual techniques including shoulder passive accessory and soft tissue mobilization.  Also performed left shoulder strengthening.  Pt encouraged to continue HEP and follow-up as scheduled.  She will benefit from PT services to address deficits in back pain, shoulder pain, and vertigo in order to return to full function at home.                                PT Short Term Goals - 05/29/21 1513       PT SHORT TERM GOAL #1   Title Pt will be independent with HEP in order to improve strength and decrease back pain in order to improve pain-free function at home.    Time 4    Period Weeks    Status New    Target Date 06/26/21               PT Long Term Goals - 08/05/21 1821       PT LONG TERM GOAL #1   Title Pt will decrease worst back pain as reported on NPRS by at least 2 points in order to demonstrate clinically significant reduction in back pain.    Baseline  05/29/21: worst: 6/10; 07/29/21: 8/10;    Time 8    Period Weeks    Status On-going    Target Date 09/23/21      PT LONG TERM GOAL #2   Title Pt. will report ability to stand for at least 20 minutes without an increase in pain or needing to sit so that she can clean dishes and cook    Baseline 05/29/21: standing endurance 5-10 minutes; 07/29/21: varies from day to day, but at least 10 minutes    Time 8    Period Weeks    Status Partially Met    Target Date 09/23/21      PT LONG TERM GOAL #3   Title Pt. will improve pain-free bilateral hip abduction and adduction strength by at least half a muscle grade to improve pain free functional ability    Baseline 05/29/21: 4-/5 bilaterally with pain; 07/29/21: Deferred    Time 8    Period Weeks    Status Deferred    Target Date 09/23/21      PT LONG TERM GOAL #4   Title Pt will increase FOTO to at least 50 in order to demonstrate improvement in pain-free function.    Baseline 05/29/21: 40; 07/29/21: 40    Time 8    Period Weeks    Status On-going    Target Date 09/23/21                   Plan - 08/05/21 1816     Clinical Impression Statement Patient arrives with excellent motivation to participate in physical therapy.  Performed left Dix-Hallpike test which is positive for vigorous up beating left torsional nystagmus with severe vertigo and nausea.  5-second latency  prior to onset of symptoms once in position.  Patient unable to tolerate remaining in position has to sit up due to nausea. Attempted again with similar response and once pt sits up she vomits. After a rest attempted one final time however once again pt has to sit up once vertigo starts due to severe nausea. Pt plans to take her antiemetic next time in addition to dramamine to help with the nausea. Performed left shoulder manual techniques including shoulder passive accessory and soft tissue mobilization.  Also performed left shoulder strengthening.  Pt encouraged to continue HEP and follow-up as scheduled.  She will benefit from PT services to address deficits in back pain, shoulder pain, and vertigo in order to return to full function at home.    Personal Factors and Comorbidities Time since onset of injury/illness/exacerbation;Past/Current Experience;Fitness;Comorbidity 3+    Comorbidities COPD, tobacco use, obesity    Examination-Activity Limitations Squat;Stand;Locomotion Level;Sit    Examination-Participation Restrictions Cleaning;Dorita Sciara    Stability/Clinical Decision Making Evolving/Moderate complexity    Rehab Potential Fair    PT Frequency 2x / week    PT Duration 8 weeks    PT Treatment/Interventions ADLs/Self Care Home Management;Cryotherapy;Electrical Stimulation;Iontophoresis 51m/ml Dexamethasone;Moist Heat;Gait training;Stair training;Functional mobility training;Therapeutic activities;Therapeutic exercise;Balance training;Neuromuscular re-education;Patient/family education;Manual techniques;Dry needling;Aquatic Therapy;Biofeedback;Canalith Repostioning;Passive range of motion;Vestibular;Spinal Manipulations;Joint Manipulations    PT Next Visit Plan Spinal mobilizations soft tissue mobilization, graded strengthening, left shoulder range of motion and strengthening, add to HEP to include left shoulder, treat BPPV once patient is ready    PT Home Exercise Plan Access Code: [email or web address]     Consulted and Agree with Plan of Care Patient               Patient will benefit from skilled therapeutic intervention  in order to improve the following deficits and impairments:  Decreased activity tolerance, Decreased range of motion, Decreased strength, Hypomobility, Pain, Impaired flexibility, Postural dysfunction  Visit Diagnosis: Dizziness and giddiness  Chronic left shoulder pain  Muscle weakness (generalized)     Problem List Patient Active Problem List   Diagnosis Date Noted   Obesity 08/22/2015   Lumbar degenerative disc disease 08/22/2015   GERD (gastroesophageal reflux disease) 04/25/2015   Arthritis 12/20/2014   Hyperlipemia 10/30/2014   Chronic obstructive pulmonary disease (HCC) 03/29/2014   Current tobacco use 03/29/2014   Phillips Grout PT, DPT, GCS  Mercie Balsley 08/05/2021, 6:23 PM  [place] [facility] [address] [place], [place], [address] Phone: [phone]   Fax:  [phone]  Name: Kelsey Rose MRN: [id number] Date of Birth: 01-27-1961

## 2021-08-07 ENCOUNTER — Other Ambulatory Visit: Payer: Self-pay

## 2021-08-07 ENCOUNTER — Ambulatory Visit: Payer: Medicare Other

## 2021-08-07 DIAGNOSIS — R42 Dizziness and giddiness: Secondary | ICD-10-CM

## 2021-08-07 DIAGNOSIS — M25512 Pain in left shoulder: Secondary | ICD-10-CM | POA: Diagnosis not present

## 2021-08-07 NOTE — Therapy (Signed)
[facility] [address] [place], [place], [address] Phone: [phone]   Fax:  [phone]  Physical Therapy Treatment  Patient Details  Name: Kelsey Rose MRN: [id number] Date of Birth: 02-08-1961 Referring Provider (PT): Verita Lamb NP   Encounter Date: 08/07/2021   PT End of Session - 08/07/21 1314     Visit Number 13    Number of Visits 33    Date for PT Re-Evaluation 09/23/21    Authorization Type eval: 05/29/21    PT Start Time 1315    PT Stop Time 1400    PT Time Calculation (min) 45 min    Activity Tolerance Patient tolerated treatment well    Behavior During Therapy [facility] for tasks assessed/performed               Past Medical History:  Diagnosis Date   Anxiety    Arthritis    Asthma    COPD (chronic obstructive pulmonary disease) ([place])    Depression    GERD (gastroesophageal reflux disease)     Past Surgical History:  Procedure Laterality Date   ABDOMINAL HYSTERECTOMY  60 yrs old   JOINT REPLACEMENT Right  May 2015   Shoulder replacement   TONSILLECTOMY AND ADENOIDECTOMY  Age 60    There were no vitals filed for this visit.   Subjective Assessment - 08/07/21 1313     Subjective Patient reports she is doing all right today. She took ondansetron and dramamine before coming to therapy today. No specific questions or concerns upon arrival.    Pertinent History Pt referred for chronic L sided low back pain. She reports that the pain is from arthritis in her back and occasionally refers into her left hip.  Pain for started 7 to 8 years ago.  She denies any traumatic onset or back surgeries.  She has tried injections but was allergic to the dye.  She has also tried physical therapy in the past with some benefit.  Most recent lumbar MRI was 06/2015 and showed lower thoracic degenerative disc disease, with advanced changes at T12-L1. L3-4, L4-5, and L5-S1 degenerative disc disease. Mild left neural foraminal  narrowing at L3-4 and L5-S1.  She has a history of R shoulder replacement. No history of L shoulder surgery but complains of regular left shoulder pain. 07/03/21 (L shoulder pain): L shoulder pain x 1 year. Intermittently aggravated prior to that but not consistent. No history of L shoulder surgery/injury. Hx of TSR on RUE. Hx of 2 injections in L shoulder. First injection provided 2 weeks of relief. Most recent injection didn't provide relief. Plain film radiographs of L shoulder show mild glenohumeral osteoarthritis. Calcified loose bodies in the axillary recess. Mild acromioclavicular osteoarthritis. She reports that she has had a L shoulder MRI at [facility] but not available for review by therapist. No prior history of therapy for L shoulder. History of chronic neck pain with some aggravation of L shoulder pain when turning head.    Limitations Standing;Walking;House hold activities;Sitting    How long can you sit comfortably? 30 minutes    How long can you stand comfortably? 5-10 minutes    How long can you walk comfortably? 20 minutes    Diagnostic tests See history    Patient Stated Goals reduce/eliminate pain, be more active                  TREATMENT    Canalith Repositioning Treatment Performed left Dix-Hallpike test which  is positive for vigorous up beating left torsional nystagmus with severe vertigo ("20/10") and nausea.  5-second latency prior to onset of symptoms once in position.  Patient treated with three bouts of Epley maneuver with 1 minute holds in each position during first maneuver and 2 minute holds during second and third maneuver. Retesting performed between maneuvers. After first maneuver pt endorses mild nausea but no further vertigo. She continues to present with upbeating L torsional nystagmus however much less vigorous. Nystagmus lasts for approximately 20s.  Same result during retesting after second maneuver but nystagmus is even more faint. A total of three  maneuvers performed during session today.                                 PT Short Term Goals - 05/29/21 1513       PT SHORT TERM GOAL #1   Title Pt will be independent with HEP in order to improve strength and decrease back pain in order to improve pain-free function at home.    Time 4    Period Weeks    Status New    Target Date 06/26/21               PT Long Term Goals - 08/05/21 1821       PT LONG TERM GOAL #1   Title Pt will decrease worst back pain as reported on NPRS by at least 2 points in order to demonstrate clinically significant reduction in back pain.    Baseline 05/29/21: worst: 6/10; 07/29/21: 8/10;    Time 8    Period Weeks    Status On-going    Target Date 09/23/21      PT LONG TERM GOAL #2   Title Pt. will report ability to stand for at least 20 minutes without an increase in pain or needing to sit so that she can clean dishes and cook    Baseline 05/29/21: standing endurance 5-10 minutes; 07/29/21: varies from day to day, but at least 10 minutes    Time 8    Period Weeks    Status Partially Met    Target Date 09/23/21      PT LONG TERM GOAL #3   Title Pt. will improve pain-free bilateral hip abduction and adduction strength by at least half a muscle grade to improve pain free functional ability    Baseline 05/29/21: 4-/5 bilaterally with pain; 07/29/21: Deferred    Time 8    Period Weeks    Status Deferred    Target Date 09/23/21      PT LONG TERM GOAL #4   Title Pt will increase FOTO to at least 50 in order to demonstrate improvement in pain-free function.    Baseline 05/29/21: 40; 07/29/21: 40    Time 8    Period Weeks    Status On-going    Target Date 09/23/21                   Plan - 08/07/21 1314     Clinical Impression Statement Performed left Dix-Hallpike test which is positive for vigorous up beating left torsional nystagmus with severe vertigo ("20/10") and nausea.  5-second latency prior to onset of  symptoms once in position.  Patient treated with three bouts of Epley maneuver with 1 minute holds in each position during first maneuver and 2 minute holds during second and third maneuver. Retesting performed between maneuvers.  After first maneuver pt endorses mild nausea but no further vertigo. She continues to present with upbeating L torsional nystagmus however much less vigorous. Nystagmus lasts for approximately 20s.  Same result during retesting after second maneuver but nystagmus is even more faint. A total of three maneuvers performed during session today. Pt encouraged to continue HEP and follow-up as scheduled.  She will benefit from PT services to address deficits in back pain, shoulder pain, and vertigo in order to return to full function at home.    Personal Factors and Comorbidities Time since onset of injury/illness/exacerbation;Past/Current Experience;Fitness;Comorbidity 3+    Comorbidities COPD, tobacco use, obesity    Examination-Activity Limitations Squat;Stand;Locomotion Level;Sit    Examination-Participation Restrictions Cleaning;Dorita Sciara    Stability/Clinical Decision Making Evolving/Moderate complexity    Rehab Potential Fair    PT Frequency 2x / week    PT Duration 8 weeks    PT Treatment/Interventions ADLs/Self Care Home Management;Cryotherapy;Electrical Stimulation;Iontophoresis 4mg /ml Dexamethasone;Moist Heat;Gait training;Stair training;Functional mobility training;Therapeutic activities;Therapeutic exercise;Balance training;Neuromuscular re-education;Patient/family education;Manual techniques;Dry needling;Aquatic Therapy;Biofeedback;Canalith Repostioning;Passive range of motion;Vestibular;Spinal Manipulations;Joint Manipulations    PT Next Visit Plan Spinal mobilizations soft tissue mobilization, graded strengthening, left shoulder range of motion and strengthening, treat BPPV    PT Home Exercise Plan Access Code: [email or web address]    Consulted and Agree with Plan of Care  Patient               Patient will benefit from skilled therapeutic intervention in order to improve the following deficits and impairments:  Decreased activity tolerance, Decreased range of motion, Decreased strength, Hypomobility, Pain, Impaired flexibility, Postural dysfunction  Visit Diagnosis: Dizziness and giddiness     Problem List Patient Active Problem List   Diagnosis Date Noted   Obesity 08/22/2015   Lumbar degenerative disc disease 08/22/2015   GERD (gastroesophageal reflux disease) 04/25/2015   Arthritis 12/20/2014   Hyperlipemia 10/30/2014   Chronic obstructive pulmonary disease (HCC) 03/29/2014   Current tobacco use 03/29/2014   Phillips Grout PT, DPT, GCS  Kelsey Rose 08/07/2021, 4:08 PM  [place] [facility] [address] [place], [place], [address] Phone: [phone]   Fax:  [phone]  Name: Kelsey Rose MRN: [id number] Date of Birth: 12-23-1960

## 2021-08-12 ENCOUNTER — Ambulatory Visit: Payer: Medicare Other | Attending: Family

## 2021-08-12 ENCOUNTER — Other Ambulatory Visit: Payer: Self-pay

## 2021-08-12 DIAGNOSIS — M25512 Pain in left shoulder: Secondary | ICD-10-CM | POA: Diagnosis not present

## 2021-08-12 DIAGNOSIS — G8929 Other chronic pain: Secondary | ICD-10-CM | POA: Diagnosis present

## 2021-08-12 DIAGNOSIS — R2689 Other abnormalities of gait and mobility: Secondary | ICD-10-CM | POA: Diagnosis present

## 2021-08-12 DIAGNOSIS — M6281 Muscle weakness (generalized): Secondary | ICD-10-CM | POA: Insufficient documentation

## 2021-08-12 DIAGNOSIS — M545 Low back pain, unspecified: Secondary | ICD-10-CM | POA: Insufficient documentation

## 2021-08-12 DIAGNOSIS — R42 Dizziness and giddiness: Secondary | ICD-10-CM | POA: Diagnosis present

## 2021-08-12 NOTE — Therapy (Signed)
[facility] [address] [place], [place], [address] Phone: [phone]   Fax:  [phone]  Physical Therapy Treatment  Patient Details  Name: Kaniah Rizzolo MRN: [id number] Date of Birth: Nov 21, 1960 Referring Provider (PT): Verita Lamb NP   Encounter Date: 08/12/2021   PT End of Session - 08/12/21 1329     Visit Number 14    Number of Visits 33    Date for PT Re-Evaluation 09/23/21    Authorization Type UHC Medicare    Authorization Time Period 07/29/21-09/23/21    Progress Note Due on Visit 20    PT Start Time 7371    PT Stop Time 1357    PT Time Calculation (min) 40 min    Activity Tolerance Patient tolerated treatment well;No increased pain    Behavior During Therapy WFL for tasks assessed/performed             Past Medical History:  Diagnosis Date   Anxiety    Arthritis    Asthma    COPD (chronic obstructive pulmonary disease) (HCC)    Depression    GERD (gastroesophageal reflux disease)     Past Surgical History:  Procedure Laterality Date   ABDOMINAL HYSTERECTOMY  60 yrs old   JOINT REPLACEMENT Right  May 2015   Shoulder replacement   TONSILLECTOMY AND ADENOIDECTOMY  Age 60    There were no vitals filed for this visit.   Subjective Assessment - 08/12/21 1321     Subjective Pt reports to be doing fairly well today. She did not take any anti dizziness agents today, dsoe snot wish to habve vertigo treatment at this time. Pt reports no time to work on HEP over weekend. Pt thinks her vertigo has been improved since last session. Although she does limit her movements a bit.    Pertinent History Pt referred for chronic L sided low back pain. She reports that the pain is from arthritis in her back and occasionally refers into her left hip.  Pain for started 7 to 8 years ago.  She denies any traumatic onset or back surgeries.  She has tried injections but was allergic to the dye.  She has also tried physical  therapy in the past with some benefit.  Most recent lumbar MRI was 06/2015 and showed lower thoracic degenerative disc disease, with advanced changes at T12-L1. L3-4, L4-5, and L5-S1 degenerative disc disease. Mild left neural foraminal narrowing at L3-4 and L5-S1.  She has a history of R shoulder replacement. No history of L shoulder surgery but complains of regular left shoulder pain. 07/03/21 (L shoulder pain): L shoulder pain x 1 year. Intermittently aggravated prior to that but not consistent. No history of L shoulder surgery/injury. Hx of TSR on RUE. Hx of 2 injections in L shoulder. First injection provided 2 weeks of relief. Most recent injection didn't provide relief. Plain film radiographs of L shoulder show mild glenohumeral osteoarthritis. Calcified loose bodies in the axillary recess. Mild acromioclavicular osteoarthritis. She reports that she has had a L shoulder MRI at [facility] but not available for review by therapist. No prior history of therapy for L shoulder. History of chronic neck pain with some aggravation of L shoulder pain when turning head.    Currently in Pain? No/denies            *pt set up in hooklying with moist heat to low back to tolerate time on back for shoulder therapy.  INTERVENTION:  -Joint  mobilization and long-axis distraction: variety of reps, hold times v oscillation, all grade 3, variety of joint angles -STM anterior deltoid, pec major, pec minor all left   *elbow/distal humerus propped on 2 airex pads to accommodate habitus and avoid excessive shoulder extension throughout  -short level shoulder flexion to 90 + serratus protraction + tennis ball grip 1x15 -pink soccer ball squeeze (medial epicondyle to rib 10 1x15x3secH  -BUE supinated, elbows 90, isometric external rotation 5secH c woven synthetic gait belt  -arm slides LLE from side to 90 degrees, cues to avoid lift x15   -short level shoulder flexion to 90 + serratus protraction + tennis ball grip  1x15 -pink soccer ball squeeze (medial epicondyle to rib 10 1x15x3secH  -BUE supinated, elbows 90, isometric external rotation 5secH c woven synthetic gait belt  -arm slides LLE from side to 90 degrees, cues to avoid lift x15     PT Short Term Goals - 05/29/21 1513       PT SHORT TERM GOAL #1   Title Pt will be independent with HEP in order to improve strength and decrease back pain in order to improve pain-free function at home.    Time 4    Period Weeks    Status New    Target Date 06/26/21               PT Long Term Goals - 08/05/21 1821       PT LONG TERM GOAL #1   Title Pt will decrease worst back pain as reported on NPRS by at least 2 points in order to demonstrate clinically significant reduction in back pain.    Baseline 05/29/21: worst: 6/10; 07/29/21: 8/10;    Time 8    Period Weeks    Status On-going    Target Date 09/23/21      PT LONG TERM GOAL #2   Title Pt. will report ability to stand for at least 20 minutes without an increase in pain or needing to sit so that she can clean dishes and cook    Baseline 05/29/21: standing endurance 5-10 minutes; 07/29/21: varies from day to day, but at least 10 minutes    Time 8    Period Weeks    Status Partially Met    Target Date 09/23/21      PT LONG TERM GOAL #3   Title Pt. will improve pain-free bilateral hip abduction and adduction strength by at least half a muscle grade to improve pain free functional ability    Baseline 05/29/21: 4-/5 bilaterally with pain; 07/29/21: Deferred    Time 8    Period Weeks    Status Deferred    Target Date 09/23/21      PT LONG TERM GOAL #4   Title Pt will increase FOTO to at least 50 in order to demonstrate improvement in pain-free function.    Baseline 05/29/21: 40; 07/29/21: 40    Time 8    Period Weeks    Status On-going    Target Date 09/23/21                   Plan - 08/12/21 1348     Clinical Impression Statement Continued with current plan of care as laid out  in evaluation and recent prior sessions. Pt remains motivated to advance progress toward goals. No pain at beginning or end of session. Large focus on joint mobilization to reduce pain and optimize neuro motor activation, then gentle low to moderate  single plane motor activation in previously designated-as-tolerable ranges. Author maintains all interventions within appropriate level of intensity as not to purposefully exacerbate pain. Pt does require varying levels of assistance and cuing for completion of exercises for correct form and sometimes due to pain/weakness. Pt continues to demonstrate progress toward goals AEB progression of some interventions this date either in volume or intensity. No updates to HEP this date.     Personal Factors and Comorbidities Time since onset of injury/illness/exacerbation;Past/Current Experience;Fitness;Comorbidity 3+    Comorbidities COPD, tobacco use, obesity    Examination-Activity Limitations Squat;Stand;Locomotion Level;Sit    Examination-Participation Restrictions Cleaning;Dorita Sciara    Stability/Clinical Decision Making Evolving/Moderate complexity    Clinical Decision Making Moderate    Rehab Potential Fair    PT Frequency 2x / week    PT Duration 8 weeks    PT Treatment/Interventions ADLs/Self Care Home Management;Cryotherapy;Electrical Stimulation;Iontophoresis 4mg /ml Dexamethasone;Moist Heat;Gait training;Stair training;Functional mobility training;Therapeutic activities;Therapeutic exercise;Balance training;Neuromuscular re-education;Patient/family education;Manual techniques;Dry needling;Aquatic Therapy;Biofeedback;Canalith Repostioning;Passive range of motion;Vestibular;Spinal Manipulations;Joint Manipulations    PT Next Visit Plan Spinal mobilizations soft tissue mobilization, graded strengthening, left shoulder range of motion and strengthening, treat BPPV    PT Home Exercise Plan Access Code: [email or web address]    Consulted and Agree with Plan of Care  Patient             Patient will benefit from skilled therapeutic intervention in order to improve the following deficits and impairments:  Decreased activity tolerance, Decreased range of motion, Decreased strength, Hypomobility, Pain, Impaired flexibility, Postural dysfunction  Visit Diagnosis: Chronic left shoulder pain  Muscle weakness (generalized)  Chronic left-sided low back pain without sciatica  Decreased mobility     Problem List Patient Active Problem List   Diagnosis Date Noted   Obesity 08/22/2015   Lumbar degenerative disc disease 08/22/2015   GERD (gastroesophageal reflux disease) 04/25/2015   Arthritis 12/20/2014   Hyperlipemia 10/30/2014   Chronic obstructive pulmonary disease ([place]) 03/29/2014   Current tobacco use 03/29/2014   2:16 PM, 08/12/21 Etta Grandchild, PT, DPT Physical Therapist - [facility] in [place] (Office)     Ravine C, PT 08/12/2021, 1:53 PM  [facility] [address] [place], [place], [address] Phone: [phone]   Fax:  [phone]  Name: Dea Bitting MRN: [id number] Date of Birth: 01/31/1961

## 2021-08-19 ENCOUNTER — Ambulatory Visit: Payer: Medicare Other

## 2021-08-19 ENCOUNTER — Other Ambulatory Visit: Payer: Self-pay

## 2021-08-19 DIAGNOSIS — G8929 Other chronic pain: Secondary | ICD-10-CM

## 2021-08-19 DIAGNOSIS — M25512 Pain in left shoulder: Secondary | ICD-10-CM | POA: Diagnosis not present

## 2021-08-19 DIAGNOSIS — M6281 Muscle weakness (generalized): Secondary | ICD-10-CM

## 2021-08-20 NOTE — Therapy (Signed)
[facility] [address] [place], [place], [address] Phone: [phone]   Fax:  [phone]  Physical Therapy Treatment  Patient Details  Name: Kelsey Rose MRN: [id number] Date of Birth: Aug 28, 1961 Referring Provider (PT): Verita Lamb NP   Encounter Date: 08/19/2021   PT End of Session - 08/19/21 1328     Visit Number 15    Number of Visits 33    Date for PT Re-Evaluation 09/23/21    Authorization Type UHC Medicare    Authorization Time Period 07/29/21-09/23/21    PT Start Time 1315    PT Stop Time 1400    PT Time Calculation (min) 45 min    Activity Tolerance Patient tolerated treatment well;No increased pain    Behavior During Therapy WFL for tasks assessed/performed               Past Medical History:  Diagnosis Date   Anxiety    Arthritis    Asthma    COPD (chronic obstructive pulmonary disease) (HCC)    Depression    GERD (gastroesophageal reflux disease)     Past Surgical History:  Procedure Laterality Date   ABDOMINAL HYSTERECTOMY  60 yrs old   JOINT REPLACEMENT Right  May 2015   Shoulder replacement   TONSILLECTOMY AND ADENOIDECTOMY  Age 24    There were no vitals filed for this visit.   Subjective Assessment - 08/19/21 1326     Subjective Pt reports to be doing alright today but she is having 10/10 L shoulder pain upon arrival. Her dizziness has been improved since her last therapy session. Her daugther was unable to come to session so she prefers not to treat her vertigo today. No specific questions or concerns.    Pertinent History Pt referred for chronic L sided low back pain. She reports that the pain is from arthritis in her back and occasionally refers into her left hip.  Pain for started 7 to 8 years ago.  She denies any traumatic onset or back surgeries.  She has tried injections but was allergic to the dye.  She has also tried physical therapy in the past with some benefit.  Most recent  lumbar MRI was 06/2015 and showed lower thoracic degenerative disc disease, with advanced changes at T12-L1. L3-4, L4-5, and L5-S1 degenerative disc disease. Mild left neural foraminal narrowing at L3-4 and L5-S1.  She has a history of R shoulder replacement. No history of L shoulder surgery but complains of regular left shoulder pain. 07/03/21 (L shoulder pain): L shoulder pain x 1 year. Intermittently aggravated prior to that but not consistent. No history of L shoulder surgery/injury. Hx of TSR on RUE. Hx of 2 injections in L shoulder. First injection provided 2 weeks of relief. Most recent injection didn't provide relief. Plain film radiographs of L shoulder show mild glenohumeral osteoarthritis. Calcified loose bodies in the axillary recess. Mild acromioclavicular osteoarthritis. She reports that she has had a L shoulder MRI at [facility] but not available for review by therapist. No prior history of therapy for L shoulder. History of chronic neck pain with some aggravation of L shoulder pain when turning head.                  TREATMENT   Manual Therapy  L shoulder PROM in all direction within pain-free range; L shoulder distraction while gently moving through PROM; L shoulder A/P mobilizations at neutral grade I-II, 30s/bout x 2 bouts; L shoulder  A/P mobilizations at 90 abduction and available end range ER, grade I-II, 30s/bout x 2 bouts; L shoulder inferior mobilizations at 90 abduction, grade I-II, 30s/bout x 2 bouts; STM using effleurage to anterior and lateral L shoulder;   Ther-ex  Supine L shoulder serratus punch with manual resistance 2 x 10; Supine left shoulder rhythmic stabilization at 90 degrees of flexion 2 x 30 seconds; Supine left shoulder flexion from neutral to 90 degrees with 2# dumbbell (DB) 2 x 10; Supine left shoulder circles at 90 degrees of flexion with 2# DB, CW/CCW 2 x 10; Supine left shoulder manually resisted IR and ER x 10 each; Supine L shoulder  isometric flexion, abduction, adduction, and extension 3s hold x 10 each;    Pt educated throughout session about proper posture and technique with exercises. Improved exercise technique, movement at target joints, use of target muscles after min to mod verbal, visual, tactile cues.     Patient arrives with excellent motivation to participate in physical therapy. Performed left shoulder manual techniques including shoulder passive accessory and soft tissue mobilization.  Also performed left shoulder strengthening.  Deferred canalith repositioning maneuvers today as pt doesn't have anyone to drive her home afterwards. Pt encouraged to continue HEP and follow-up as scheduled.  She will benefit from PT services to address deficits in back pain, shoulder pain, and vertigo in order to return to full function at home.                               PT Short Term Goals - 05/29/21 1513       PT SHORT TERM GOAL #1   Title Pt will be independent with HEP in order to improve strength and decrease back pain in order to improve pain-free function at home.    Time 4    Period Weeks    Status New    Target Date 06/26/21               PT Long Term Goals - 08/05/21 1821       PT LONG TERM GOAL #1   Title Pt will decrease worst back pain as reported on NPRS by at least 2 points in order to demonstrate clinically significant reduction in back pain.    Baseline 05/29/21: worst: 6/10; 07/29/21: 8/10;    Time 8    Period Weeks    Status On-going    Target Date 09/23/21      PT LONG TERM GOAL #2   Title Pt. will report ability to stand for at least 20 minutes without an increase in pain or needing to sit so that she can clean dishes and cook    Baseline 05/29/21: standing endurance 5-10 minutes; 07/29/21: varies from day to day, but at least 10 minutes    Time 8    Period Weeks    Status Partially Met    Target Date 09/23/21      PT LONG TERM GOAL #3   Title Pt. will improve  pain-free bilateral hip abduction and adduction strength by at least half a muscle grade to improve pain free functional ability    Baseline 05/29/21: 4-/5 bilaterally with pain; 07/29/21: Deferred    Time 8    Period Weeks    Status Deferred    Target Date 09/23/21      PT LONG TERM GOAL #4   Title Pt will increase FOTO to at least 50  in order to demonstrate improvement in pain-free function.    Baseline 05/29/21: 40; 07/29/21: 40    Time 8    Period Weeks    Status On-going    Target Date 09/23/21                   Plan - 08/19/21 1337     Clinical Impression Statement Patient arrives with excellent motivation to participate in physical therapy. Performed left shoulder manual techniques including shoulder passive accessory and soft tissue mobilization.  Also performed left shoulder strengthening.  Deferred canalith repositioning maneuvers today as pt doesn't have anyone to drive her home afterwards. Pt encouraged to continue HEP and follow-up as scheduled.  She will benefit from PT services to address deficits in back pain, shoulder pain, and vertigo in order to return to full function at home.    Personal Factors and Comorbidities Time since onset of injury/illness/exacerbation;Past/Current Experience;Fitness;Comorbidity 3+    Comorbidities COPD, tobacco use, obesity    Examination-Activity Limitations Squat;Stand;Locomotion Level;Sit    Examination-Participation Restrictions Cleaning;Dorita Sciara    Stability/Clinical Decision Making Evolving/Moderate complexity    Rehab Potential Fair    PT Frequency 2x / week    PT Duration 8 weeks    PT Treatment/Interventions ADLs/Self Care Home Management;Cryotherapy;Electrical Stimulation;Iontophoresis 47m/ml Dexamethasone;Moist Heat;Gait training;Stair training;Functional mobility training;Therapeutic activities;Therapeutic exercise;Balance training;Neuromuscular re-education;Patient/family education;Manual techniques;Dry  needling;Aquatic Therapy;Biofeedback;Canalith Repostioning;Passive range of motion;Vestibular;Spinal Manipulations;Joint Manipulations    PT Next Visit Plan Spinal mobilizations soft tissue mobilization, graded strengthening, left shoulder range of motion and strengthening, add to HEP to include left shoulder, treat BPPV as able    PT Home Exercise Plan Access Code: [email or web address]    Consulted and Agree with Plan of Care Patient               Patient will benefit from skilled therapeutic intervention in order to improve the following deficits and impairments:  Decreased activity tolerance, Decreased range of motion, Decreased strength, Hypomobility, Pain, Impaired flexibility, Postural dysfunction  Visit Diagnosis: Chronic left shoulder pain  Muscle weakness (generalized)     Problem List Patient Active Problem List   Diagnosis Date Noted   Obesity 08/22/2015   Lumbar degenerative disc disease 08/22/2015   GERD (gastroesophageal reflux disease) 04/25/2015   Arthritis 12/20/2014   Hyperlipemia 10/30/2014   Chronic obstructive pulmonary disease ([place] 03/29/2014   Current tobacco use 03/29/2014   JPhillips GroutPT, DPT, GCS  Amos Gaber 08/20/2021, 9:27 AM  [place] [facility] [place] [address]Phone: [phone]  Fax:  [phone] Name: Kelsey BoguckiMRN: [id number] of Birth: 8June 29, 1962

## 2021-08-21 ENCOUNTER — Other Ambulatory Visit: Payer: Self-pay

## 2021-08-21 ENCOUNTER — Ambulatory Visit: Payer: Medicare Other

## 2021-08-21 DIAGNOSIS — M25512 Pain in left shoulder: Secondary | ICD-10-CM | POA: Diagnosis not present

## 2021-08-21 DIAGNOSIS — R42 Dizziness and giddiness: Secondary | ICD-10-CM

## 2021-08-21 NOTE — Therapy (Signed)
[facility] [address]. [place], [place], [address] Phone: [phone]   Fax:  [phone]  Physical Therapy Treatment  Patient Details  Name: Kelsey Rose MRN: [id number] Date of Birth: Jul 16, 1961 Referring Provider (PT): Verita Lamb NP   Encounter Date: 08/21/2021   PT End of Session - 08/21/21 1327     Visit Number 16    Number of Visits 33    Date for PT Re-Evaluation 09/23/21    Authorization Type [employer] Medicare    Authorization Time Period 07/29/21-09/23/21    PT Start Time 1315    PT Stop Time 1400    PT Time Calculation (min) 45 min    Activity Tolerance Patient tolerated treatment well;No increased pain    Behavior During Therapy WFL for tasks assessed/performed               Past Medical History:  Diagnosis Date   Anxiety    Arthritis    Asthma    COPD (chronic obstructive pulmonary disease) (HCC)    Depression    GERD (gastroesophageal reflux disease)     Past Surgical History:  Procedure Laterality Date   ABDOMINAL HYSTERECTOMY  60 yrs old   JOINT REPLACEMENT Right  May 2015   Shoulder replacement   TONSILLECTOMY AND ADENOIDECTOMY  Age 30    There were no vitals filed for this visit.   Subjective Assessment - 08/21/21 1325     Subjective Pt reports that her L shoulder and L elbow have been hurting today. She denies any resting shoulder pain currently but rates her L elbow pain as 8/10. Her dizziness continues to improve and she hasn't really noticed any episodes. No specific questions currently.    Pertinent History Pt referred for chronic L sided low back pain. She reports that the pain is from arthritis in her back and occasionally refers into her left hip.  Pain for started 7 to 8 years ago.  She denies any traumatic onset or back surgeries.  She has tried injections but was allergic to the dye.  She has also tried physical therapy in the past with some benefit.  Most recent lumbar MRI was  06/2015 and showed lower thoracic degenerative disc disease, with advanced changes at T12-L1. L3-4, L4-5, and L5-S1 degenerative disc disease. Mild left neural foraminal narrowing at L3-4 and L5-S1.  She has a history of R shoulder replacement. No history of L shoulder surgery but complains of regular left shoulder pain. 07/03/21 (L shoulder pain): L shoulder pain x 1 year. Intermittently aggravated prior to that but not consistent. No history of L shoulder surgery/injury. Hx of TSR on RUE. Hx of 2 injections in L shoulder. First injection provided 2 weeks of relief. Most recent injection didn't provide relief. Plain film radiographs of L shoulder show mild glenohumeral osteoarthritis. Calcified loose bodies in the axillary recess. Mild acromioclavicular osteoarthritis. She reports that she has had a L shoulder MRI at [facility] but not available for review by therapist. No prior history of therapy for L shoulder. History of chronic neck pain with some aggravation of L shoulder pain when turning head.    Limitations Standing;Walking;House hold activities;Sitting    How long can you sit comfortably? 30 minutes    How long can you stand comfortably? 5-10 minutes    How long can you walk comfortably? 20 minutes    Patient Stated Goals reduce/eliminate pain, be more active  TREATMENT    Canalith Repositioning Treatment Performed left Dix-Hallpike test which is positive for only a couple faint beats of upbeating L torsional nystagmus (less than 5 beats). Pt reports a sensation that the vertigo is going to start but it never begins. No nausea reported. Patient treated with three bouts of Epley maneuver with 2 minute holds during each maneuver. Retesting performed between maneuvers. Utilized inverted mat table to help with positioning and decrease strain on neck and back. After first maneuver all additional Dix-Hallpike tests are negative for either vertigo or nystagmus. During third  Epley maneuver after 1 minute in the 2nd position (R Hallpike position) pt starts to have vigorous upbeating R torsional nystagmus with concurrent vertigo which lasts for approximately 30s. Upon sitting up at end of bed after completing third maneuver pt has reversal of nystagmus with concurrent vertigo for approximately 30s. Therapist wanted to perform Dix-Hallpike test after final maneuver however pt is sweating and feeling mildly nauseated so deferred. Pt advised not to take any Meclizine or Zofran prior to next session given improvement in her symptoms and to avoid any suppressed response during canal testing to ensure full resolution of BPPV.                                 PT Short Term Goals - 05/29/21 1513       PT SHORT TERM GOAL #1   Title Pt will be independent with HEP in order to improve strength and decrease back pain in order to improve pain-free function at home.    Time 4    Period Weeks    Status New    Target Date 06/26/21               PT Long Term Goals - 08/05/21 1821       PT LONG TERM GOAL #1   Title Pt will decrease worst back pain as reported on NPRS by at least 2 points in order to demonstrate clinically significant reduction in back pain.    Baseline 05/29/21: worst: 6/10; 07/29/21: 8/10;    Time 8    Period Weeks    Status On-going    Target Date 09/23/21      PT LONG TERM GOAL #2   Title Pt. will report ability to stand for at least 20 minutes without an increase in pain or needing to sit so that she can clean dishes and cook    Baseline 05/29/21: standing endurance 5-10 minutes; 07/29/21: varies from day to day, but at least 10 minutes    Time 8    Period Weeks    Status Partially Met    Target Date 09/23/21      PT LONG TERM GOAL #3   Title Pt. will improve pain-free bilateral hip abduction and adduction strength by at least half a muscle grade to improve pain free functional ability    Baseline 05/29/21: 4-/5 bilaterally  with pain; 07/29/21: Deferred    Time 8    Period Weeks    Status Deferred    Target Date 09/23/21      PT LONG TERM GOAL #4   Title Pt will increase FOTO to at least 50 in order to demonstrate improvement in pain-free function.    Baseline 05/29/21: 40; 07/29/21: 40    Time 8    Period Weeks    Status On-going    Target Date 09/23/21  Plan - 08/21/21 1328     Clinical Impression Statement Performed left Dix-Hallpike test which is positive for only a couple faint beats of upbeating L torsional nystagmus (less than 5 beats). Pt reports a sensation that the vertigo is going to start but it never begins. No nausea reported. Patient treated with three bouts of Epley maneuver with 2 minute holds during each maneuver. Retesting performed between maneuvers. Utilized inverted mat table to help with positioning and decrease strain on neck and back. After first maneuver all additional Dix-Hallpike tests are negative for either vertigo or nystagmus. During third Epley maneuver after 1 minute in the 2nd position (R Hallpike position) pt starts to have vigorous upbeating R torsional nystagmus with concurrent vertigo which lasts for approximately 30s. Upon sitting up at end of bed after completing third maneuver pt has reversal of nystagmus with concurrent vertigo for approximately 30s. Therapist wanted to perform Dix-Hallpike test after final maneuver however pt is sweating and feeling mildly nauseated so deferred. Pt advised not to take any Meclizine or Zofran prior to next session given improvement in her symptoms and to avoid any suppressed response during canal testing to ensure full resolution of BPPV.    Personal Factors and Comorbidities Time since onset of injury/illness/exacerbation;Past/Current Experience;Fitness;Comorbidity 3+    Comorbidities COPD, tobacco use, obesity    Examination-Activity Limitations Squat;Stand;Locomotion Level;Sit    Examination-Participation  Restrictions Cleaning;Dorita Sciara    Stability/Clinical Decision Making Evolving/Moderate complexity    Rehab Potential Fair    PT Frequency 2x / week    PT Duration 8 weeks    PT Treatment/Interventions ADLs/Self Care Home Management;Cryotherapy;Electrical Stimulation;Iontophoresis 4mg /ml Dexamethasone;Moist Heat;Gait training;Stair training;Functional mobility training;Therapeutic activities;Therapeutic exercise;Balance training;Neuromuscular re-education;Patient/family education;Manual techniques;Dry needling;Aquatic Therapy;Biofeedback;Canalith Repostioning;Passive range of motion;Vestibular;Spinal Manipulations;Joint Manipulations    PT Next Visit Plan Spinal mobilizations soft tissue mobilization, graded strengthening, left shoulder range of motion and strengthening, add to HEP to include left shoulder, treat BPPV as able    PT Home Exercise Plan Access Code: [email or web address]    Consulted and Agree with Plan of Care Patient               Patient will benefit from skilled therapeutic intervention in order to improve the following deficits and impairments:  Decreased activity tolerance, Decreased range of motion, Decreased strength, Hypomobility, Pain, Impaired flexibility, Postural dysfunction  Visit Diagnosis: Dizziness and giddiness     Problem List Patient Active Problem List   Diagnosis Date Noted   Obesity 08/22/2015   Lumbar degenerative disc disease 08/22/2015   GERD (gastroesophageal reflux disease) 04/25/2015   Arthritis 12/20/2014   Hyperlipemia 10/30/2014   Chronic obstructive pulmonary disease ([place]) 03/29/2014   Current tobacco use 03/29/2014   Phillips Grout PT, DPT, GCS  Tilly Pernice 08/21/2021, 3:54 PM  [facility] [address]. [place], [place], [address] Phone: [phone]   Fax:  [phone]  Name: Kelsey Rose MRN: [id number] Date of Birth: Dec 20, 1960

## 2021-09-04 ENCOUNTER — Ambulatory Visit: Payer: Medicare Other

## 2021-09-04 ENCOUNTER — Other Ambulatory Visit: Payer: Self-pay

## 2021-09-04 DIAGNOSIS — M25512 Pain in left shoulder: Secondary | ICD-10-CM

## 2021-09-04 DIAGNOSIS — M6281 Muscle weakness (generalized): Secondary | ICD-10-CM

## 2021-09-04 DIAGNOSIS — G8929 Other chronic pain: Secondary | ICD-10-CM

## 2021-09-04 NOTE — Therapy (Signed)
[facility] [address]. [place], [place], [address] Phone: [phone]   Fax:  [phone]  Physical Therapy Treatment  Patient Details  Name: Kelsey Rose MRN: [id number] Date of Birth: 25-Aug-1961 Referring Provider (PT): Verita Lamb NP   Encounter Date: 09/04/2021   PT End of Session - 09/04/21 1333     Visit Number 17    Number of Visits 33    Date for PT Re-Evaluation 09/23/21    Authorization Type UHC Medicare    Authorization Time Period 07/29/21-09/23/21    PT Start Time 1315    PT Stop Time 1400    PT Time Calculation (min) 45 min    Activity Tolerance Patient tolerated treatment well;No increased pain    Behavior During Therapy WFL for tasks assessed/performed               Past Medical History:  Diagnosis Date   Anxiety    Arthritis    Asthma    COPD (chronic obstructive pulmonary disease) (HCC)    Depression    GERD (gastroesophageal reflux disease)     Past Surgical History:  Procedure Laterality Date   ABDOMINAL HYSTERECTOMY  60 yrs old   JOINT REPLACEMENT Right  May 2015   Shoulder replacement   TONSILLECTOMY AND ADENOIDECTOMY  Age 60    There were no vitals filed for this visit.   Subjective Assessment - 09/04/21 1323     Subjective Pt reports that her L shoulder has been very painful. She reports 3/10 L shoulder pain upon arrival. She continues to have dizziness/vertigo. After her last therapy session she states that she felt off balance for approximatley 3 days.  No specific questions currently.    Pertinent History Pt referred for chronic L sided low back pain. She reports that the pain is from arthritis in her back and occasionally refers into her left hip.  Pain for started 7 to 8 years ago.  She denies any traumatic onset or back surgeries.  She has tried injections but was allergic to the dye.  She has also tried physical therapy in the past with some benefit.  Most recent lumbar MRI  was 06/2015 and showed lower thoracic degenerative disc disease, with advanced changes at T12-L1. L3-4, L4-5, and L5-S1 degenerative disc disease. Mild left neural foraminal narrowing at L3-4 and L5-S1.  She has a history of R shoulder replacement. No history of L shoulder surgery but complains of regular left shoulder pain. 07/03/21 (L shoulder pain): L shoulder pain x 1 year. Intermittently aggravated prior to that but not consistent. No history of L shoulder surgery/injury. Hx of TSR on RUE. Hx of 2 injections in L shoulder. First injection provided 2 weeks of relief. Most recent injection didn't provide relief. Plain film radiographs of L shoulder show mild glenohumeral osteoarthritis. Calcified loose bodies in the axillary recess. Mild acromioclavicular osteoarthritis. She reports that she has had a L shoulder MRI at [facility] but not available for review by therapist. No prior history of therapy for L shoulder. History of chronic neck pain with some aggravation of L shoulder pain when turning head.    Limitations Standing;Walking;House hold activities;Sitting    How long can you sit comfortably? 30 minutes    How long can you stand comfortably? 5-10 minutes    How long can you walk comfortably? 20 minutes    Patient Stated Goals reduce/eliminate pain, be more active  TREATMENT    Manual Therapy  L shoulder PROM in all direction within pain-free range; L shoulder distraction while gently moving through PROM; L shoulder A/P mobilizations at neutral grade I-II, 30s/bout x 2 bouts; L shoulder A/P mobilizations at 90 abduction and available end range ER, grade I-II, 30s/bout x 2 bouts; L shoulder inferior mobilizations at 90 abduction, grade I-II, 30s/bout x 2 bouts;   Ther-ex  Supine L shoulder serratus punch with manual resistance 2 x 10; Supine left shoulder flexion from neutral to 90 degrees with 2# dumbbell (DB) 2 x 10; Supine left shoulder circles at 90 degrees  of flexion with 2# DB, CW/CCW 2 x 10; Supine L shoulder isometric flexion, abduction, adduction, IR, ER, and extension 3s hold x 10 each;    Pt educated throughout session about proper posture and technique with exercises. Improved exercise technique, movement at target joints, use of target muscles after min to mod verbal, visual, tactile cues.     Patient arrives with excellent motivation to participate in physical therapy.  She is unable to receive treatment for vertigo today as she does not have someone to drive her home.  Performed left shoulder manual techniques including shoulder passive accessory and soft tissue mobilization.  Also performed left shoulder strengthening.  Pt encouraged to continue HEP and follow-up as scheduled.  She will benefit from PT services to address deficits in back pain, shoulder pain, and vertigo in order to return to full function at home.                                 PT Short Term Goals - 05/29/21 1513       PT SHORT TERM GOAL #1   Title Pt will be independent with HEP in order to improve strength and decrease back pain in order to improve pain-free function at home.    Time 4    Period Weeks    Status New    Target Date 06/26/21               PT Long Term Goals - 08/05/21 1821       PT LONG TERM GOAL #1   Title Pt will decrease worst back pain as reported on NPRS by at least 2 points in order to demonstrate clinically significant reduction in back pain.    Baseline 05/29/21: worst: 6/10; 07/29/21: 8/10;    Time 8    Period Weeks    Status On-going    Target Date 09/23/21      PT LONG TERM GOAL #2   Title Pt. will report ability to stand for at least 20 minutes without an increase in pain or needing to sit so that she can clean dishes and cook    Baseline 05/29/21: standing endurance 5-10 minutes; 07/29/21: varies from day to day, but at least 10 minutes    Time 8    Period Weeks    Status Partially Met    Target  Date 09/23/21      PT LONG TERM GOAL #3   Title Pt. will improve pain-free bilateral hip abduction and adduction strength by at least half a muscle grade to improve pain free functional ability    Baseline 05/29/21: 4-/5 bilaterally with pain; 07/29/21: Deferred    Time 8    Period Weeks    Status Deferred    Target Date 09/23/21      PT LONG  TERM GOAL #4   Title Pt will increase FOTO to at least 50 in order to demonstrate improvement in pain-free function.    Baseline 05/29/21: 40; 07/29/21: 40    Time 8    Period Weeks    Status On-going    Target Date 09/23/21                   Plan - 09/04/21 1333     Clinical Impression Statement Patient arrives with excellent motivation to participate in physical therapy.  She is unable to receive treatment for vertigo today as she does not have someone to drive her home.  Performed left shoulder manual techniques including shoulder passive accessory and soft tissue mobilization.  Also performed left shoulder strengthening.  Pt encouraged to continue HEP and follow-up as scheduled.  She will benefit from PT services to address deficits in back pain, shoulder pain, and vertigo in order to return to full function at home.    Personal Factors and Comorbidities Time since onset of injury/illness/exacerbation;Past/Current Experience;Fitness;Comorbidity 3+    Comorbidities COPD, tobacco use, obesity    Examination-Activity Limitations Squat;Stand;Locomotion Level;Sit    Examination-Participation Restrictions Cleaning;Dorita Sciara    Stability/Clinical Decision Making Evolving/Moderate complexity    Rehab Potential Fair    PT Frequency 2x / week    PT Duration 8 weeks    PT Treatment/Interventions ADLs/Self Care Home Management;Cryotherapy;Electrical Stimulation;Iontophoresis 18m/ml Dexamethasone;Moist Heat;Gait training;Stair training;Functional mobility training;Therapeutic activities;Therapeutic exercise;Balance training;Neuromuscular  re-education;Patient/family education;Manual techniques;Dry needling;Aquatic Therapy;Biofeedback;Canalith Repostioning;Passive range of motion;Vestibular;Spinal Manipulations;Joint Manipulations    PT Next Visit Plan Spinal mobilizations soft tissue mobilization, graded strengthening, left shoulder range of motion and strengthening, add to HEP to include left shoulder, treat BPPV as able    PT Home Exercise Plan Access Code: [email or web address]    Consulted and Agree with Plan of Care Patient               Patient will benefit from skilled therapeutic intervention in order to improve the following deficits and impairments:  Decreased activity tolerance, Decreased range of motion, Decreased strength, Hypomobility, Pain, Impaired flexibility, Postural dysfunction  Visit Diagnosis: Chronic left shoulder pain  Muscle weakness (generalized)     Problem List Patient Active Problem List   Diagnosis Date Noted   Obesity 08/22/2015   Lumbar degenerative disc disease 08/22/2015   GERD (gastroesophageal reflux disease) 04/25/2015   Arthritis 12/20/2014   Hyperlipemia 10/30/2014   Ch[place] obstructive pulmonary disease ([place] 03/29/2014   Current tobacco use 03/29/2014   JPhillips GroutPT, DPT, GCS  Huprich,Jason 09/05/2021, 2:11 PM  [place] [facility] [place] [address]Phone: [phone]  Fax:  [phone] Name: Kelsey LahmMRN: [id number] of Birth: 803/09/1961

## 2021-09-09 ENCOUNTER — Other Ambulatory Visit: Payer: Self-pay

## 2021-09-09 ENCOUNTER — Ambulatory Visit: Payer: Medicare Other | Attending: Family

## 2021-09-09 DIAGNOSIS — G8929 Other chronic pain: Secondary | ICD-10-CM | POA: Diagnosis present

## 2021-09-09 DIAGNOSIS — M6281 Muscle weakness (generalized): Secondary | ICD-10-CM | POA: Diagnosis present

## 2021-09-09 DIAGNOSIS — R42 Dizziness and giddiness: Secondary | ICD-10-CM | POA: Diagnosis present

## 2021-09-09 DIAGNOSIS — M25512 Pain in left shoulder: Secondary | ICD-10-CM | POA: Diagnosis present

## 2021-09-09 DIAGNOSIS — M545 Low back pain, unspecified: Secondary | ICD-10-CM | POA: Diagnosis present

## 2021-09-09 NOTE — Therapy (Signed)
[facility] [address] [place], [place], [address] Phone: [phone]   Fax:  [phone]  Physical Therapy Treatment  Patient Details  Name: Kelsey Rose MRN: [id number] Date of Birth: 02/27/1961 Referring Provider (PT): Verita Lamb NP   Encounter Date: 09/09/2021   PT End of Session - 09/09/21 1344     Visit Number 18    Number of Visits 33    Date for PT Re-Evaluation 09/23/21    Authorization Type UHC Medicare    Authorization Time Period 07/29/21-09/23/21    PT Start Time 1315    PT Stop Time 1400    PT Time Calculation (min) 45 min    Activity Tolerance Patient tolerated treatment well;No increased pain    Behavior During Therapy WFL for tasks assessed/performed                Past Medical History:  Diagnosis Date   Anxiety    Arthritis    Asthma    COPD (chronic obstructive pulmonary disease) (HCC)    Depression    GERD (gastroesophageal reflux disease)     Past Surgical History:  Procedure Laterality Date   ABDOMINAL HYSTERECTOMY  60 yrs old   JOINT REPLACEMENT Right  May 2015   Shoulder replacement   TONSILLECTOMY AND ADENOIDECTOMY  Age 36    There were no vitals filed for this visit.   Subjective Assessment - 09/09/21 1325     Subjective Pt reports that her L shoulder has been very painful. She reports 7/10 L shoulder pain upon arrival. She continues to have dizziness/vertigo. Her R shoulder continues to bother her and she is going to see if her MD will add her R shoulder to her PT order.  No specific questions currently.    Pertinent History Pt referred for chronic L sided low back pain. She reports that the pain is from arthritis in her back and occasionally refers into her left hip.  Pain for started 7 to 8 years ago.  She denies any traumatic onset or back surgeries.  She has tried injections but was allergic to the dye.  She has also tried physical therapy in the past with some benefit.   Most recent lumbar MRI was 06/2015 and showed lower thoracic degenerative disc disease, with advanced changes at T12-L1. L3-4, L4-5, and L5-S1 degenerative disc disease. Mild left neural foraminal narrowing at L3-4 and L5-S1.  She has a history of R shoulder replacement. No history of L shoulder surgery but complains of regular left shoulder pain. 07/03/21 (L shoulder pain): L shoulder pain x 1 year. Intermittently aggravated prior to that but not consistent. No history of L shoulder surgery/injury. Hx of TSR on RUE. Hx of 2 injections in L shoulder. First injection provided 2 weeks of relief. Most recent injection didn't provide relief. Plain film radiographs of L shoulder show mild glenohumeral osteoarthritis. Calcified loose bodies in the axillary recess. Mild acromioclavicular osteoarthritis. She reports that she has had a L shoulder MRI at [facility] but not available for review by therapist. No prior history of therapy for L shoulder. History of chronic neck pain with some aggravation of L shoulder pain when turning head.    Limitations Standing;Walking;House hold activities;Sitting    How long can you sit comfortably? 30 minutes    How long can you stand comfortably? 5-10 minutes    How long can you walk comfortably? 20 minutes    Patient Stated Goals reduce/eliminate pain, be  more active                  TREATMENT    Canalith Repositioning Treatment Performed left Dix-Hallpike test which is negative for both vertigo and nystagmus. No nausea reported. Performed R Dix-Hallpike which is positive for 2/10 vertigo with 15-20s of upbeating R torsional nystagmus. Patient treated with three bouts of Epley maneuver for presumed R posterior canal BPPV with 2 minute holds in each position during maneuvers. Retesting performed between maneuvers. Utilized inverted mat table to help with positioning and decrease strain on neck and back. During retesting between maneuvers pt continues to present with  upbeating R torsional nystagmus and reports 5/10 vertigo. At this point it appears that L posterior canal BPPV has resolved however she continues with R posterior canal BPPV. Will continue to test and treat at future sessions until it is fully clear.                               PT Short Term Goals - 05/29/21 1513       PT SHORT TERM GOAL #1   Title Pt will be independent with HEP in order to improve strength and decrease back pain in order to improve pain-free function at home.    Time 4    Period Weeks    Status New    Target Date 06/26/21               PT Long Term Goals - 08/05/21 1821       PT LONG TERM GOAL #1   Title Pt will decrease worst back pain as reported on NPRS by at least 2 points in order to demonstrate clinically significant reduction in back pain.    Baseline 05/29/21: worst: 6/10; 07/29/21: 8/10;    Time 8    Period Weeks    Status On-going    Target Date 09/23/21      PT LONG TERM GOAL #2   Title Pt. will report ability to stand for at least 20 minutes without an increase in pain or needing to sit so that she can clean dishes and cook    Baseline 05/29/21: standing endurance 5-10 minutes; 07/29/21: varies from day to day, but at least 10 minutes    Time 8    Period Weeks    Status Partially Met    Target Date 09/23/21      PT LONG TERM GOAL #3   Title Pt. will improve pain-free bilateral hip abduction and adduction strength by at least half a muscle grade to improve pain free functional ability    Baseline 05/29/21: 4-/5 bilaterally with pain; 07/29/21: Deferred    Time 8    Period Weeks    Status Deferred    Target Date 09/23/21      PT LONG TERM GOAL #4   Title Pt will increase FOTO to at least 50 in order to demonstrate improvement in pain-free function.    Baseline 05/29/21: 40; 07/29/21: 40    Time 8    Period Weeks    Status On-going    Target Date 09/23/21                   Plan - 09/09/21 1345      Clinical Impression Statement Performed left Dix-Hallpike test which is negative for both vertigo and nystagmus. No nausea reported. Performed R Dix-Hallpike which is positive for 2/10 vertigo with   15-20s of upbeating R torsional nystagmus. Patient treated with three bouts of Epley maneuver for presumed R posterior canal BPPV with 2 minute holds in each position during maneuvers. Retesting performed between maneuvers. Utilized inverted mat table to help with positioning and decrease strain on neck and back. During retesting between manuevers pt continues to present with upbeating R torsional nystagmus and reports 5/10 vertigo. At this point it appears that L posterior canal BPPV has resolved however she continues with R posterior canal BPPV. Will continue to test and treat at future sessions until it is fully clear.    Personal Factors and Comorbidities Time since onset of injury/illness/exacerbation;Past/Current Experience;Fitness;Comorbidity 3+    Comorbidities COPD, tobacco use, obesity    Examination-Activity Limitations Squat;Stand;Locomotion Level;Sit    Examination-Participation Restrictions Cleaning;Dorita Sciara    Stability/Clinical Decision Making Evolving/Moderate complexity    Rehab Potential Fair    PT Frequency 2x / week    PT Duration 8 weeks    PT Treatment/Interventions ADLs/Self Care Home Management;Cryotherapy;Electrical Stimulation;Iontophoresis 53m/ml Dexamethasone;Moist Heat;Gait training;Stair training;Functional mobility training;Therapeutic activities;Therapeutic exercise;Balance training;Neuromuscular re-education;Patient/family education;Manual techniques;Dry needling;Aquatic Therapy;Biofeedback;Canalith Repostioning;Passive range of motion;Vestibular;Spinal Manipulations;Joint Manipulations    PT Next Visit Plan Spinal mobilizations soft tissue mobilization, graded strengthening, left shoulder range of motion and strengthening, add to HEP to include left shoulder, treat  BPPV as able    PT Home Exercise Plan Access Code: [email or web address]    Consulted and Agree with Plan of Care Patient                Patient will benefit from skilled therapeutic intervention in order to improve the following deficits and impairments:  Decreased activity tolerance, Decreased range of motion, Decreased strength, Hypomobility, Pain, Impaired flexibility, Postural dysfunction  Visit Diagnosis: Dizziness and giddiness     Problem List Patient Active Problem List   Diagnosis Date Noted   Obesity 08/22/2015   Lumbar degenerative disc disease 08/22/2015   GERD (gastroesophageal reflux disease) 04/25/2015   Arthritis 12/20/2014   Hyperlipemia 10/30/2014   [place] 03/29[place]   Current tobacco use 03/29/2014   JPhillips GroutPT, DPT, GCS  Branda Chaudhary 09/09/2021, 1:58 PM  [facility] [place] [address]Phone: [phone]  Fax:  [phone] Name: GEmelyn RoenMRN: [id number] of Birth: [birth year]

## 2021-09-11 ENCOUNTER — Other Ambulatory Visit: Payer: Self-pay

## 2021-09-11 ENCOUNTER — Ambulatory Visit: Payer: Medicare Other

## 2021-09-11 DIAGNOSIS — R42 Dizziness and giddiness: Secondary | ICD-10-CM | POA: Diagnosis not present

## 2021-09-11 DIAGNOSIS — M25512 Pain in left shoulder: Secondary | ICD-10-CM

## 2021-09-11 DIAGNOSIS — G8929 Other chronic pain: Secondary | ICD-10-CM

## 2021-09-11 DIAGNOSIS — M6281 Muscle weakness (generalized): Secondary | ICD-10-CM

## 2021-09-11 NOTE — Therapy (Signed)
[facility] [address] [place], [place], [address] Phone: [phone]   Fax:  [phone]  Physical Therapy Treatment  Patient Details  Name: Kelsey Rose MRN: [id number] Date of Birth: 1961-08-24 Referring Provider (PT): Verita Lamb NP   Encounter Date: 09/11/2021   PT End of Session - 09/11/21 1324     Visit Number 19    Number of Visits 33    Date for PT Re-Evaluation 09/23/21    Authorization Type UHC Medicare    Authorization Time Period 07/29/21-09/23/21    PT Start Time 1318    PT Stop Time 1400    PT Time Calculation (min) 42 min    Activity Tolerance Patient tolerated treatment well    Behavior During Therapy [facility] for tasks assessed/performed               Past Medical History:  Diagnosis Date   Anxiety    Arthritis    Asthma    COPD (chronic obstructive pulmonary disease) ([place])    Depression    GERD (gastroesophageal reflux disease)     Past Surgical History:  Procedure Laterality Date   ABDOMINAL HYSTERECTOMY  60 yrs old   JOINT REPLACEMENT Right  May 2015   Shoulder replacement   TONSILLECTOMY AND ADENOIDECTOMY  Age 48    There were no vitals filed for this visit.   Subjective Assessment - 09/11/21 1322     Subjective Pt reports that her L shoulder has been very painful. She reports 8/10 chronic L shoulder pain upon arrival and 5/10 low back pain. No further episodes of vertigo since last therapy session. No specific questions currently.    Pertinent History Pt referred for chronic L sided low back pain. She reports that the pain is from arthritis in her back and occasionally refers into her left hip.  Pain for started 7 to 8 years ago.  She denies any traumatic onset or back surgeries.  She has tried injections but was allergic to the dye.  She has also tried physical therapy in the past with some benefit.  Most recent lumbar MRI was 06/2015 and showed lower thoracic degenerative disc disease,  with advanced changes at T12-L1. L3-4, L4-5, and L5-S1 degenerative disc disease. Mild left neural foraminal narrowing at L3-4 and L5-S1.  She has a history of R shoulder replacement. No history of L shoulder surgery but complains of regular left shoulder pain. 07/03/21 (L shoulder pain): L shoulder pain x 1 year. Intermittently aggravated prior to that but not consistent. No history of L shoulder surgery/injury. Hx of TSR on RUE. Hx of 2 injections in L shoulder. First injection provided 2 weeks of relief. Most recent injection didn't provide relief. Plain film radiographs of L shoulder show mild glenohumeral osteoarthritis. Calcified loose bodies in the axillary recess. Mild acromioclavicular osteoarthritis. She reports that she has had a L shoulder MRI at [facility] but not available for review by therapist. No prior history of therapy for L shoulder. History of chronic neck pain with some aggravation of L shoulder pain when turning head.    Limitations Standing;Walking;House hold activities;Sitting    How long can you sit comfortably? 30 minutes    How long can you stand comfortably? 5-10 minutes    How long can you walk comfortably? 20 minutes    Patient Stated Goals reduce/eliminate pain, be more active  TREATMENT    Ther-ex  NuStep L2 x 5:30 minutes for warm-up during interval history (2 minutes unbilled); Pt placed in semirecumbent position with moist-heat pack on L shoulder during review of symptoms and discussion regarding vertigo, shoulder pain, and back pain; After 5 minutes on mat table pt reports onset of vertigo.  Therapist observes up beating right torsional nystagmus.  Patient assisted into sitting and nystagmus and vertigo resolve however patient begins to experience sweating and nausea.  Therapist provided patient with a wet washcloth as well as emesis basin.  Therapist stayed with patient for the next 10 minutes monitoring symptoms and her nausea slowly  began to subside.  Patient is unable to continue with additional therapy on this date.     After initial warm up pt placed in semirecumbent position with moist-heat pack on L shoulder during review of symptoms and discussion regarding vertigo, shoulder pain, and back pain. After 5 minutes on mat table pt reports onset of vertigo.  Therapist observes up beating right torsional nystagmus.  Patient assisted into sitting and nystagmus and vertigo resolve however patient begins to experience sweating and nausea.  Therapist provided patient with a wet washcloth as well as emesis basin.  Therapist stayed with patient for the next 10 minutes monitoring symptoms and her nausea slowly began to subside.  Patient is unable to continue with additional therapy on this date. Pt will benefit from PT services to address deficits in vertigo, back pain, and shoulder pain in order to return to full function at home.                         PT Short Term Goals - 05/29/21 1513       PT SHORT TERM GOAL #1   Title Pt will be independent with HEP in order to improve strength and decrease back pain in order to improve pain-free function at home.    Time 4    Period Weeks    Status New    Target Date 06/26/21               PT Long Term Goals - 08/05/21 1821       PT LONG TERM GOAL #1   Title Pt will decrease worst back pain as reported on NPRS by at least 2 points in order to demonstrate clinically significant reduction in back pain.    Baseline 05/29/21: worst: 6/10; 07/29/21: 8/10;    Time 8    Period Weeks    Status On-going    Target Date 09/23/21      PT LONG TERM GOAL #2   Title Pt. will report ability to stand for at least 20 minutes without an increase in pain or needing to sit so that she can clean dishes and cook    Baseline 05/29/21: standing endurance 5-10 minutes; 07/29/21: varies from day to day, but at least 10 minutes    Time 8    Period Weeks    Status Partially Met     Target Date 09/23/21      PT LONG TERM GOAL #3   Title Pt. will improve pain-free bilateral hip abduction and adduction strength by at least half a muscle grade to improve pain free functional ability    Baseline 05/29/21: 4-/5 bilaterally with pain; 07/29/21: Deferred    Time 8    Period Weeks    Status Deferred    Target Date 09/23/21      PT  LONG TERM GOAL #4   Title Pt will increase FOTO to at least 50 in order to demonstrate improvement in pain-free function.    Baseline 05/29/21: 40; 07/29/21: 40    Time 8    Period Weeks    Status On-going    Target Date 09/23/21                   Plan - 09/11/21 1325     Clinical Impression Statement After initial warm up pt placed in semirecumbent position with moist-heat pack on L shoulder during review of symptoms and discussion regarding vertigo, shoulder pain, and back pain. After 5 minutes on mat table pt reports onset of vertigo.  Therapist observes up beating right torsional nystagmus.  Patient assisted into sitting and nystagmus and vertigo resolve however patient begins to experience sweating and nausea.  Therapist provided patient with a wet washcloth as well as emesis basin.  Therapist stayed with patient for the next 10 minutes monitoring symptoms and her nausea slowly began to subside.  Patient is unable to continue with additional therapy on this date. Pt will benefit from PT services to address deficits in vertigo, back pain, and shoulder pain in order to return to full function at home.    Personal Factors and Comorbidities Time since onset of injury/illness/exacerbation;Past/Current Experience;Fitness;Comorbidity 3+    Comorbidities COPD, tobacco use, obesity    Examination-Activity Limitations Squat;Stand;Locomotion Level;Sit    Examination-Participation Restrictions Cleaning;Dorita Sciara    Stability/Clinical Decision Making Evolving/Moderate complexity    Rehab Potential Fair    PT Frequency 2x / week    PT  Duration 8 weeks    PT Treatment/Interventions ADLs/Self Care Home Management;Cryotherapy;Electrical Stimulation;Iontophoresis 4mg /ml Dexamethasone;Moist Heat;Gait training;Stair training;Functional mobility training;Therapeutic activities;Therapeutic exercise;Balance training;Neuromuscular re-education;Patient/family education;Manual techniques;Dry needling;Aquatic Therapy;Biofeedback;Canalith Repostioning;Passive range of motion;Vestibular;Spinal Manipulations;Joint Manipulations    PT Next Visit Plan Update outcome measures, goals, progress note; spinal mobilizations soft tissue mobilization, graded strengthening, left shoulder range of motion and strengthening, add to HEP to include left shoulder, treat BPPV as able    PT Home Exercise Plan Access Code: [email or web address]    Consulted and Agree with Plan of Care Patient               Patient will benefit from skilled therapeutic intervention in order to improve the following deficits and impairments:  Decreased activity tolerance, Decreased range of motion, Decreased strength, Hypomobility, Pain, Impaired flexibility, Postural dysfunction  Visit Diagnosis: Chronic left shoulder pain  Muscle weakness (generalized)     Problem List Patient Active Problem List   Diagnosis Date Noted   Obesity 08/22/2015   Lumbar degenerative disc disease 08/22/2015   GERD (gastroesophageal reflux disease) 04/25/2015   Arthritis 12/20/2014   Hyperlipemia 10/30/2014   Chronic obstructive pulmonary disease ([place]) 03/29/2014   Current tobacco use 03/29/2014   Phillips Grout PT, DPT, GCS  Mechelle Pates 09/12/2021, 8:22 AM  [facility] [address] [place], [place], [address] Phone: [phone]   Fax:  [phone]  Name: Kelsey Rose MRN: [id number] Date of Birth: 08-04-1961

## 2021-09-16 ENCOUNTER — Ambulatory Visit: Payer: Medicare Other

## 2021-09-18 ENCOUNTER — Ambulatory Visit: Payer: Medicare Other

## 2021-09-23 ENCOUNTER — Other Ambulatory Visit: Payer: Self-pay

## 2021-09-23 ENCOUNTER — Ambulatory Visit: Payer: Medicare Other

## 2021-09-23 DIAGNOSIS — R42 Dizziness and giddiness: Secondary | ICD-10-CM | POA: Diagnosis not present

## 2021-09-23 DIAGNOSIS — M6281 Muscle weakness (generalized): Secondary | ICD-10-CM

## 2021-09-23 DIAGNOSIS — M25512 Pain in left shoulder: Secondary | ICD-10-CM

## 2021-09-23 DIAGNOSIS — G8929 Other chronic pain: Secondary | ICD-10-CM

## 2021-09-23 NOTE — Therapy (Signed)
[facility] [address] [place], [place], [address] Phone: [phone]   Fax:  [phone]  Physical Therapy Treatment/Recertification  Patient Details  Name: Kelsey Rose MRN: [id number] Date of Birth: 07/28/1961 Referring Provider (PT): Verita Lamb NP   Encounter Date: 09/23/2021   PT End of Session - 09/23/21 1318     Visit Number 20    Number of Visits 41    Date for PT Re-Evaluation 11/18/21    Authorization Type [employer] Medicare    Authorization Time Period --    PT Start Time 1315    PT Stop Time 1400    PT Time Calculation (min) 45 min    Activity Tolerance Patient tolerated treatment well    Behavior During Therapy [facility] for tasks assessed/performed               Past Medical History:  Diagnosis Date   Anxiety    Arthritis    Asthma    COPD (chronic obstructive pulmonary disease) ([place])    Depression    GERD (gastroesophageal reflux disease)     Past Surgical History:  Procedure Laterality Date   ABDOMINAL HYSTERECTOMY  60 yrs old   JOINT REPLACEMENT Right  May 2015   Shoulder replacement   TONSILLECTOMY AND ADENOIDECTOMY  Age 60    There were no vitals filed for this visit.   Subjective Assessment - 09/23/21 1328     Subjective Pt reports L shoulder is still very painful and she rates it as 8/10 currently. She complains of 7/10 low back pain currently. She denies any further episodes of vertigo this week. No specific questions currently.    Pertinent History Pt referred for chronic L sided low back pain. She reports that the pain is from arthritis in her back and occasionally refers into her left hip.  Pain for started 7 to 8 years ago.  She denies any traumatic onset or back surgeries.  She has tried injections but was allergic to the dye.  She has also tried physical therapy in the past with some benefit.  Most recent lumbar MRI was 06/2015 and showed lower thoracic degenerative disc disease, with  advanced changes at T12-L1. L3-4, L4-5, and L5-S1 degenerative disc disease. Mild left neural foraminal narrowing at L3-4 and L5-S1.  She has a history of R shoulder replacement. No history of L shoulder surgery but complains of regular left shoulder pain. 07/03/21 (L shoulder pain): L shoulder pain x 1 year. Intermittently aggravated prior to that but not consistent. No history of L shoulder surgery/injury. Hx of TSR on RUE. Hx of 2 injections in L shoulder. First injection provided 2 weeks of relief. Most recent injection didn't provide relief. Plain film radiographs of L shoulder show mild glenohumeral osteoarthritis. Calcified loose bodies in the axillary recess. Mild acromioclavicular osteoarthritis. She reports that she has had a L shoulder MRI at [facility] but not available for review by therapist. No prior history of therapy for L shoulder. History of chronic neck pain with some aggravation of L shoulder pain when turning head.    Limitations Standing;Walking;House hold activities;Sitting    How long can you sit comfortably? 30 minutes    How long can you stand comfortably? 5-10 minutes    How long can you walk comfortably? 20 minutes    Patient Stated Goals reduce/eliminate pain, be more active                  TREATMENT  Manual Therapy  L shoulder PROM in all direction within pain-free range; L shoulder distraction while gently moving through PROM; L shoulder A/P mobilizations at neutral grade I-II, 30s/bout x 2 bouts; L shoulder A/P mobilizations at 90 abduction and available end range ER, grade I-II, 30s/bout x 2 bouts; L shoulder inferior mobilizations at 90 abduction, grade I-II, 30s/bout x 2 bouts;   Ther-ex  NuStep L2 x 5 minutes with moist heat pack on low back during interval history (3 minutes unbilled); Updated outcome measures (see goals); Supine L shoulder serratus punch with manual resistance x 10; Supine left shoulder flexion from neutral to 90 degrees  with 2# dumbbell (DB) x 10; Supine L shoulder isometric flexion, abduction, adduction, IR, and ER 3s hold x 10 each;    Pt educated throughout session about proper posture and technique with exercises. Improved exercise technique, movement at target joints, use of target muscles after min to mod verbal, visual, tactile cues.     Patient arrives with excellent motivation to participate in physical therapy.  She is unable to receive treatment for vertigo today as she does not have someone to drive her home.  Performed left shoulder manual techniques including shoulder passive accessory and soft tissue mobilization.  Also performed left shoulder strengthening. Updated outcome measures with patient related to her low back pain. Unfortunately she does not show any improvement in her outcome measures and in some cases worsening. Pt has not responded to conservative management for her L shoulder and low back pain so will discharge her from these services. However pt will continue to return for therapy until her BPPV is fully resolved. Pt encouraged to continue HEP and follow-up as scheduled for BPPV treatment. She will benefit from PT services to address deficits in vertigo in order to return to full function at home.                               PT Short Term Goals - 09/23/21 1318       PT SHORT TERM GOAL #1   Title Pt will be independent with HEP in order to decrease vertigo and improve symptom-free function at home    Time 4    Period Weeks    Status On-going    Target Date 10/21/21               PT Long Term Goals - 09/23/21 1319       PT LONG TERM GOAL #1   Title Pt will decrease worst back pain as reported on NPRS by at least 2 points in order to demonstrate clinically significant reduction in back pain.    Baseline 05/29/21: worst: 6/10; 07/29/21: 8/10; 09/23/21: 7/10;    Time 8    Period Weeks    Status Not Met      PT LONG TERM GOAL #2   Title Pt. will  report ability to stand for at least 20 minutes without an increase in pain or needing to sit so that she can clean dishes and cook    Baseline 05/29/21: standing endurance 5-10 minutes; 07/29/21: varies from day to day, but at least 10 minutes; 09/23/21: 10-15 minutes;    Time 8    Period Weeks    Status Not Met      PT LONG TERM GOAL #3   Title Pt. will improve pain-free bilateral hip abduction and adduction strength by at least half a muscle grade  to improve pain free functional ability    Baseline 05/29/21: 4-/5 bilaterally with pain; 07/29/21: Deferred; 09/23/21: 4-/5 bilaterally with pain    Time 8    Period Weeks    Status Not Met      PT LONG TERM GOAL #4   Title Pt will increase FOTO to at least 50 in order to demonstrate improvement in pain-free function.    Baseline 05/29/21: 40; 07/29/21: 40; 09/23/21: 36    Time 8    Period Weeks    Status Not Met      PT LONG TERM GOAL #5   Title Pt will decrease DHI score by at least 18 points in order to demonstrate clinically significant reduction in disability    Baseline 09/23/21: 86/100    Time 8    Period Weeks    Status New    Target Date 11/18/21      Additional Long Term Goals   Additional Long Term Goals Yes      PT LONG TERM GOAL #6   Title Pt will increase FOTO to at 54 least in order to demonstrate significant improvement in function related to vertigo    Baseline 09/23/21: 38    Time 8    Period Weeks    Status New    Target Date 11/18/21                   Plan - 09/23/21 1318     Clinical Impression Statement Patient arrives with excellent motivation to participate in physical therapy.  She is unable to receive treatment for vertigo today as she does not have someone to drive her home.  Performed left shoulder manual techniques including shoulder passive accessory and soft tissue mobilization.  Also performed left shoulder strengthening. Updated outcome measures with patient related to her low back pain.  Unfortunately she does not show any improvement in her outcome measures and in some cases worsening. Pt has not responded to conservative management for her L shoulder and low back pain so will discharge her from these services. However pt will continue to return for therapy until her BPPV is fully resolved. Pt encouraged to continue HEP and follow-up as scheduled for BPPV treatment. She will benefit from PT services to address deficits in vertigo in order to return to full function at home.    Personal Factors and Comorbidities Time since onset of injury/illness/exacerbation;Past/Current Experience;Fitness;Comorbidity 3+    Comorbidities COPD, tobacco use, obesity    Examination-Activity Limitations Squat;Stand;Locomotion Level;Sit    Examination-Participation Restrictions Cleaning;Dorita Sciara    Stability/Clinical Decision Making Evolving/Moderate complexity    Rehab Potential Fair    PT Frequency 1x / week    PT Duration 8 weeks    PT Treatment/Interventions ADLs/Self Care Home Management;Cryotherapy;Electrical Stimulation;Iontophoresis 53m/ml Dexamethasone;Moist Heat;Gait training;Stair training;Functional mobility training;Therapeutic activities;Therapeutic exercise;Balance training;Neuromuscular re-education;Patient/family education;Manual techniques;Dry needling;Aquatic Therapy;Biofeedback;Canalith Repostioning;Passive range of motion;Vestibular;Spinal Manipulations;Joint Manipulations    PT Next Visit Plan Canalith repositioning treatments until full resolution of vertigo    PT Home Exercise Plan Access Code: [email or web address]    Consulted and Agree with Plan of Care Patient               Patient will benefit from skilled therapeutic intervention in order to improve the following deficits and impairments:  Decreased activity tolerance, Decreased range of motion, Decreased strength, Hypomobility, Pain, Impaired flexibility, Postural dysfunction  Visit Diagnosis: Chronic left-sided low  back pain without sciatica  Chronic left shoulder pain  Muscle weakness (generalized)  Dizziness and giddiness     Problem List Patient Active Problem List   Diagnosis Date Noted   Obesity 08/22/2015   Lumbar degenerative disc disease 08/22/2015   GERD (gastroesophageal reflux disease) 04/25/2015   Arthritis 12/20/2014   Hyperlipemia 10/30/2014   Chronic obstructive pulmonary disease ([place]) 03/29/2014   Current tobacco use 03/29/2014   Phillips Grout PT, DPT, GCS  Dayron Odland 09/23/2021, 2:22 PM  [place] [facility] [address] [place], [place], [address] Phone: [phone]   Fax:  [phone]  Name: Kelsey Rose MRN: [id number] Date of Birth: 1961-02-28

## 2021-09-25 ENCOUNTER — Ambulatory Visit: Payer: Medicare Other

## 2021-09-30 ENCOUNTER — Ambulatory Visit: Payer: Medicare Other

## 2022-02-16 DIAGNOSIS — K529 Noninfective gastroenteritis and colitis, unspecified: Secondary | ICD-10-CM | POA: Insufficient documentation

## 2022-02-16 LAB — HM MAMMOGRAPHY

## 2022-02-17 ENCOUNTER — Ambulatory Visit: Payer: Medicare Other | Attending: Family

## 2022-02-17 DIAGNOSIS — R42 Dizziness and giddiness: Secondary | ICD-10-CM | POA: Diagnosis present

## 2022-02-17 NOTE — Therapy (Signed)
?OUTPATIENT PHYSICAL THERAPY VESTIBULAR EVALUATION ? ? ?Patient Name: Kelsey Rose ?MRN: [id number] ?DOB:02/16/1961, 61 y.o., female ?Today's Date: 02/17/2022 ? ?PCP: Patient, No Pcp Per (Inactive) ?REFERRING PROVIDER: Verita Lamb, NP ? ? PT End of Session - 02/17/22 0850   ? ? Visit Number 1   ? Number of Visits 9   ? Date for PT Re-Evaluation 04/14/22   ? Authorization Type eval: 02/17/22   ? PT Start Time [phone]   ? PT Stop Time 0930   ? PT Time Calculation (min) 35 min   ? Behavior During Therapy [facility] for tasks assessed/performed   ? ?  ?  ? ?  ? ? ?Past Medical History:  ?Diagnosis Date  ? Anxiety   ? Arthritis   ? Asthma   ? COPD (chronic obstructive pulmonary disease) ([place])   ? Depression   ? GERD (gastroesophageal reflux disease)   ? ?Past Surgical History:  ?Procedure Laterality Date  ? ABDOMINAL HYSTERECTOMY  61 yrs old  ? JOINT REPLACEMENT Right  May 2015  ? Shoulder replacement  ? TONSILLECTOMY AND ADENOIDECTOMY  Age 63  ? ?Patient Active Problem List  ? Diagnosis Date Noted  ? Obesity 08/22/2015  ? Lumbar degenerative disc disease 08/22/2015  ? GERD (gastroesophageal reflux disease) 04/25/2015  ? Arthritis 12/20/2014  ? Hyperlipemia 10/30/2014  ? Chronic obstructive pulmonary disease ([place]) 03/29/2014  ? Current tobacco use 03/29/2014  ? ? ? ?PCP: Patient, No Pcp Per (Inactive) ? ?REFERRING PROVIDER: Verita Lamb, NP ? ?REFERRING DIAGNOSIS: Dizziness and giddiness ? ?THERAPY DIAG: Dizziness and giddiness ? ?ONSET DATE: "Years ago" ? ?FOLLOW UP APPT WITH PROVIDER: Yes, regular visits scheduled  ? ? ?SUBJECTIVE:  ? ?Chief Complaint:  ?Vertigo with laying down and rolling in bed.  ? ?Pertinent History ?Pt complains of severe vertigo when laying down and rolling in bed. This has been ongoing for years and pt was treated at this clinic previously for this issue. In the past pt would experience nausea and vomiting during attempted testing and treatment and was unable to tolerate full treatment to achieve  complete remission of symptoms. Pt also has a history of chronic L sided low back pain and participated in physical therapy at this clinic with limited benefit. She also has a history of R shoulder replacement. ?Description of dizziness: vertigo ?Frequency: weekly ?Duration: seconds ?Symptom nature: positional ?Progression of symptoms since onset: no change ?History of similar episodes: Yes ? ?Provocative Factors: quick positional changes ?Easing Factors: Waiting for symptoms to pass ? ?Auditory complaints (tinnitus, pain, drainage, hearing loss, aural fullness): Yes, fluid in L ear currently. Pt is on an antibiotic for a sinus infection. With the sinus infection she reports L aural fullness but no difficulty hearing ?Vision (diplopia, visual field loss, recent changes, recent eye exam): Yes, floaters in L eye which are chronic. Eye exam last year. No double vision. No recent significant changes in vision. ? ?Has patient fallen in last 6 months? No ?Pertinent pain: Yes, chronic back pain ?Dominant hand: right ?Imaging: No, no recent imaging related to referral ?Prior level of function: Independent for ADLs/IADLs at home but has to move slowly. ?Occupational demands: Not working ?Hobbies: spending time with dog, playing video games ? ?Red Flags (dysarthria, dysphagia, drop attacks, bowel and bladder changes, recent weight loss/gain): Review of systems negative for red flags.  ? ?PRECAUTIONS: None ? ?WEIGHT BEARING RESTRICTIONS No ? ?LIVING ENVIRONMENT: ?Lives with:  lives with friend ?Lives in: House/apartment ?Stairs: Yes; External: 2 steps; none ? ?  PATIENT GOALS ?Improve vertigo ? ? ?OBJECTIVE ? ?EXAMINATION ? 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 ? ?NEUROLOGICAL SCREEN: (2+ unless otherwise noted.) N=normal  Ab=abnormal ? ?Level Dermatome R L Myotome R L Reflex R L  ?C3 Anterior Neck N N Sidebend C2-3 N N Jaw CN V    ?C4 Top of  Shoulder N N Shoulder Shrug C4 N N Hoffman?s UMN    ?C5 Lateral Upper Arm N N Shoulder ABD C4-5 N N Biceps C5-6    ?C6 Lateral Arm/ Thumb N N Arm Flex/ Wrist Ext C5-6 N N Brachiorad. C5-6    ?C7 Middle Finger N N Arm Ext//Wrist Flex C6-7 N N Triceps C7    ?C8 4th & 5th Finger N N Flex/ Ext Carpi Ulnaris C8 N N Patellar (L3-4)    ?T1 Medial Arm N N Interossei T1 N N Gastrocnemius    ?L2 Medial thigh/groin N N Illiopsoas (L2-3) N N     ?L3 Lower thigh/med.knee N N Quadriceps (L3-4) N N     ?L4 Medial leg/lat thigh N N Tibialis Ant (L4-5) N N     ?L5 Lat. leg & dorsal foot N N EHL (L5) N N     ?S1 post/lat foot/thigh/leg N N Gastrocnemius (S1-2) N N     ?S2 Post./med. thigh & leg N N Hamstrings (L4-S3) N N     ? ? ?CRANIAL NERVES ?Deferred ? ?SOMATOSENSORY ?Intact to light touch throughout BUE/BLE. Proprioception and hot/cold testing deferred on this date. ? ?COORDINATION ?Deferred ? ?RANGE OF MOTION ?No focal deficits in AROM noted in BUE/BLE ? ?MANUAL MUSCLE TESTING ?BUE/BLE strength WNL without focal deficits ? ?TRANSFERS/GAIT ?Independent for transfers and ambulation without assistive device  ? ? ?PATIENT SURVEYS ?FOTO: 39, predicted improvement to 54 ?DHI: 68/100 ? ? ?POSTURAL CONTROL TESTS ?Clinical Test of Sensory Interaction for Balance (CTSIB): Deferred ? ? ?OCULOMOTOR / VESTIBULAR TESTING ? ?Oculomotor Exam- Room Light: Deferred ? ?Oculomotor Exam- Fixation Suppressed: Deferred ? ? ?BPPV TESTS: ? Symptoms Duration Intensity Nystagmus  ?L Dix-Hallpike Vertigo 30s Mild Downbeating L torsional  ?R Dix-Hallpike Vertigo 30s Severe Upbeating R torsional with nausea and vomiting  ?L Head Roll      ?R Head Roll      ?L Sidelying Test      ?R Sidelying Test      ?(blank = not tested) ? ? ?FUNCTIONAL OUTCOME MEASURES ? ? Results Comments  ?BERG    ?DGI    ?FGA    ?TUG    ?5TSTS    ?6 Minute Walk Test    ?10 Meter Gait Speed    ?(blank = not tested) ? ? ?TODAY'S TREATMENT  ?No treatment during visit  today ? ? ? ?ASSESSMENT: ? ?CLINICAL IMPRESSION: ?Patient is a 61 y.o. female who was seen today for physical therapy evaluation and treatment for dizzness. Objective impairments include dizziness. Pt with positive R Dix-Hallpike test for severe vertigo with nausea and vomiting. She was unable to proceed with treatment today because of vomiting and did not take an anti-emetic prior to evaluation. Pt plans to stop by PCP office to request a prescription so she can take it prior to her next treatment session. These impairments are limiting patient from  bed mobility  and require pt to move slowly around the house.  Personal factors including Past/current experiences, Time since onset of injury/illness/exacerbation, and 3+ comorbidities: sinus infection, back pain, anxiety, and  depression  are also affecting patient's functional outcome. Patient will benefit from skilled PT to address above impairments and improve overall function. ? ?REHAB POTENTIAL: Good ? ?CLINICAL DECISION MAKING: Stable/uncomplicated ? ?EVALUATION COMPLEXITY: Low ? ? ?GOALS: ?Goals reviewed with patient? Yes ? ?SHORT TERM GOALS: Target date: 03/17/2022 ? ?Pt will be independent with HEP in order to improve strength and balance in order to decrease fall risk and improve function at home. ?Baseline:  ?Goal status: INITIAL ? ? ?LONG TERM GOALS: Target date: 04/14/2022 ? ?Pt will increase FOTO to at least 54 to demonstrate significant improvement in function at home related to dizziness.  ?Baseline: 02/17/2022: 39 ?Goal status: INITIAL ? ?2.  Pt will decrease DHI score by at least 18 points in order to demonstrate clinically significant reduction in disability  ?Baseline: 02/17/2022: 68/100 ?Goal status: INITIAL ? ?3.  Pt will report at least 75% improvement in her vertigo symptoms in order to improve symptom-free function at home and with leisure activities ?Baseline:  ?Goal status: INITIAL ? ? ? ?PLAN: ? ?PT FREQUENCY: 1x/week ? ?PT DURATION: 8  weeks ? ?PLANNED INTERVENTIONS: Therapeutic exercises, Therapeutic activity, Neuromuscular re-education, Balance training, Gait training, Patient/Family education, Joint manipulation, Joint mobilization, Canalith repositioning, Aqu

## 2022-03-11 ENCOUNTER — Ambulatory Visit: Payer: Medicare Other

## 2022-03-17 NOTE — Therapy (Signed)
?OUTPATIENT PHYSICAL THERAPY VESTIBULAR TREATMENT ? ? ?Patient Name: Kelsey Rose ?MRN: [id number] ?DOB:Jul 23, 1961, 61 y.o., female ?Today's Date: 03/19/2022 ? ?PCP: Patient, No Pcp Per (Inactive) ?REFERRING PROVIDER: Etheleen Nicks, NP ? ? PT End of Session - 03/18/22 1304   ? ? Visit Number 2   ? Number of Visits 9   ? Date for PT Re-Evaluation 04/14/22   ? Authorization Type eval: 02/17/22   ? PT Start Time 1315   ? PT Stop Time 1400   ? PT Time Calculation (min) 45 min   ? Behavior During Therapy [facility] for tasks assessed/performed   ? ?  ?  ? ?  ? ? ?Past Medical History:  ?Diagnosis Date  ? Anxiety   ? Arthritis   ? Asthma   ? COPD (chronic obstructive pulmonary disease) (HCC)   ? Depression   ? GERD (gastroesophageal reflux disease)   ? ?Past Surgical History:  ?Procedure Laterality Date  ? ABDOMINAL HYSTERECTOMY  61 yrs old  ? JOINT REPLACEMENT Right  May 2015  ? Shoulder replacement  ? TONSILLECTOMY AND ADENOIDECTOMY  Age 46  ? ?Patient Active Problem List  ? Diagnosis Date Noted  ? Obesity 08/22/2015  ? Lumbar degenerative disc disease 08/22/2015  ? GERD (gastroesophageal reflux disease) 04/25/2015  ? Arthritis 12/20/2014  ? Hyperlipemia 10/30/2014  ? Chronic obstructive pulmonary disease (HCC) 03/29/2014  ? Current tobacco use 03/29/2014  ? ? ? ?PCP: Patient, No Pcp Per (Inactive) ? ?REFERRING PROVIDER: Etheleen Nicks, NP ? ?REFERRING DIAGNOSIS: Dizziness and giddiness ? ?THERAPY DIAG: Dizziness and giddiness ? ?ONSET DATE: "Years ago" ? ?FOLLOW UP APPT WITH PROVIDER: Yes, regular visits scheduled  ? ? ?FROM INITIAL EVALUATION:  ? ?SUBJECTIVE:  ? ?Chief Complaint:  ?Vertigo with laying down and rolling in bed.  ? ?Pertinent History ?Pt complains of severe vertigo when laying down and rolling in bed. This has been ongoing for years and pt was treated at this clinic previously for this issue. In the past pt would experience nausea and vomiting during attempted testing and treatment and was unable to tolerate  full treatment to achieve complete remission of symptoms. Pt also has a history of chronic L sided low back pain and participated in physical therapy at this clinic with limited benefit. She also has a history of R shoulder replacement. ?Description of dizziness: vertigo ?Frequency: weekly ?Duration: seconds ?Symptom nature: positional ?Progression of symptoms since onset: no change ?History of similar episodes: Yes ? ?Provocative Factors: quick positional changes ?Easing Factors: Waiting for symptoms to pass ? ?Auditory complaints (tinnitus, pain, drainage, hearing loss, aural fullness): Yes, fluid in L ear currently. Pt is on an antibiotic for a sinus infection. With the sinus infection she reports L aural fullness but no difficulty hearing ?Vision (diplopia, visual field loss, recent changes, recent eye exam): Yes, floaters in L eye which are chronic. Eye exam last year. No double vision. No recent significant changes in vision. ? ?Has patient fallen in last 6 months? No ?Pertinent pain: Yes, chronic back pain ?Dominant hand: right ?Imaging: No, no recent imaging related to referral ?Prior level of function: Independent for ADLs/IADLs at home but has to move slowly. ?Occupational demands: Not working ?Hobbies: spending time with dog, playing video games ? ?Red Flags (dysarthria, dysphagia, drop attacks, bowel and bladder changes, recent weight loss/gain): Review of systems negative for red flags.  ? ?PRECAUTIONS: None ? ?WEIGHT BEARING RESTRICTIONS No ? ?LIVING ENVIRONMENT: ?Lives with:  lives with friend ?Lives in: House/apartment ?Stairs:  Yes; External: 2 steps; none ? ?PATIENT GOALS ?Improve vertigo ? ? ?OBJECTIVE ? ?EXAMINATION ? 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 ? ?NEUROLOGICAL SCREEN: (2+ unless otherwise noted.) N=normal  Ab=abnormal ? ?Level Dermatome R L Myotome R L Reflex R L  ?C3 Anterior Neck N N Sidebend C2-3 N  N Jaw CN V    ?C4 Top of Shoulder N N Shoulder Shrug C4 N N Hoffman?s UMN    ?C5 Lateral Upper Arm N N Shoulder ABD C4-5 N N Biceps C5-6    ?C6 Lateral Arm/ Thumb N N Arm Flex/ Wrist Ext C5-6 N N Brachiorad. C5-6    ?C7 Middle Finger N N Arm Ext//Wrist Flex C6-7 N N Triceps C7    ?C8 4th & 5th Finger N N Flex/ Ext Carpi Ulnaris C8 N N Patellar (L3-4)    ?T1 Medial Arm N N Interossei T1 N N Gastrocnemius    ?L2 Medial thigh/groin N N Illiopsoas (L2-3) N N     ?L3 Lower thigh/med.knee N N Quadriceps (L3-4) N N     ?L4 Medial leg/lat thigh N N Tibialis Ant (L4-5) N N     ?L5 Lat. leg & dorsal foot N N EHL (L5) N N     ?S1 post/lat foot/thigh/leg N N Gastrocnemius (S1-2) N N     ?S2 Post./med. thigh & leg N N Hamstrings (L4-S3) N N     ? ? ?CRANIAL NERVES ?Deferred ? ?SOMATOSENSORY ?Intact to light touch throughout BUE/BLE. Proprioception and hot/cold testing deferred on this date. ? ?COORDINATION ?Deferred ? ?RANGE OF MOTION ?No focal deficits in AROM noted in BUE/BLE ? ?MANUAL MUSCLE TESTING ?BUE/BLE strength WNL without focal deficits ? ?TRANSFERS/GAIT ?Independent for transfers and ambulation without assistive device  ? ? ?PATIENT SURVEYS ?FOTO: 39, predicted improvement to 54 ?DHI: 68/100 ? ? ?POSTURAL CONTROL TESTS ?Clinical Test of Sensory Interaction for Balance (CTSIB): Deferred ? ? ?OCULOMOTOR / VESTIBULAR TESTING ? ?Oculomotor Exam- Room Light: Deferred ? ?Oculomotor Exam- Fixation Suppressed: Deferred ? ? ?BPPV TESTS: ? Symptoms Duration Intensity Nystagmus  ?L Dix-Hallpike Vertigo 30s Mild Downbeating L torsional  ?R Dix-Hallpike Vertigo 30s Severe Upbeating R torsional with nausea and vomiting  ?L Head Roll      ?R Head Roll      ?L Sidelying Test      ?R Sidelying Test      ?(blank = not tested) ? ? ?FUNCTIONAL OUTCOME MEASURES ? ? Results Comments  ?BERG    ?DGI    ?FGA    ?TUG    ?5TSTS    ?6 Minute Walk Test    ?10 Meter Gait Speed    ?(blank = not tested) ? ? ?TODAY'S TREATMENT  ? ?SUBJECTIVE: Pt  reports that she is doing well today. No changes since the initial evaluation. She took her anti-emetic prior to coming in for therapy to help with the nausea during the canalith repositioning maneuvers. No specific questions or concerns currently.  ? ?PAIN: None related to this issue ? ?Canalith Repositioning Treatment ?Right Dix-hallpike test is positive for vigorous upbeating R torsional nystagmus with concurrent vertigo consistent with R posterior canal BPPV. Nystagmus and vertigo start to resolve after about 15-20s however 10s later vigorous nystagmus returns with severe vertigo and nausea. Pt has to sit up and begins and starts to vomit. Therapist provided wet towel to patient and nausea improves after 5 minutes. Afterward performed R sidelying test  which is positive for upbeating R torsional nystagmus with concurrent vertigo. Pt experiences nausea but never vomits. Proceeded to perform R Liberatory maneuver with 1 minute holds in each position. Repeated R sidelying test and it remains positive with vigorous upbeating R torsional nystagmus with vertigo. Pt proceeds to sit up and vomit again. After vomiting finishes and pt recovers she is able to tolerate one additional round of R Liberatory maneuver. Pt reports she cannot tolerate a third maneuver due to nausea.  ? ? ?PATIENT EDUCATION:  ?Education details: Plan of care and BPPV ?Person educated: Patient ?Education method: Explanation ?Education comprehension: verbalized understanding  ? ? ?HOME EXERICSE PROGRAM ?None ? ? ?ASSESSMENT: ? ?CLINICAL IMPRESSION: ?Right Dix-hallpike test is positive for vigorous upbeating R torsional nystagmus with concurrent vertigo consistent with R posterior canal BPPV. Nystagmus and vertigo start to resolve after about 15-20s however 10s later vigorous nystagmus returns with severe vertigo and nausea. Pt has to sit up and begins and starts to vomit. Therapist provided wet towel to patient and nausea improves after 5 minutes.  Afterward performed R sidelying test which is positive for upbeating R torsional nystagmus with concurrent vertigo. Pt experiences nausea but never vomits. Proceeded to perform R Liberatory maneuver with

## 2022-03-18 ENCOUNTER — Ambulatory Visit: Payer: Medicare Other | Attending: Family

## 2022-03-18 DIAGNOSIS — R42 Dizziness and giddiness: Secondary | ICD-10-CM | POA: Insufficient documentation

## 2022-03-24 NOTE — Therapy (Incomplete)
?OUTPATIENT PHYSICAL THERAPY VESTIBULAR TREATMENT ? ? ?Patient Name: Kelsey ParisGlenda Faye Pawloski ?MRN: [id number] ?DOB:05/24/1961, 61 y.o., female ?Today's Date: 03/24/2022 ? ?PCP: Patient, No Pcp Per (Inactive) ?REFERRING PROVIDER: Etheleen NicksMacDonald, Keri, NP ? ? ? ? ?Past Medical History:  ?Diagnosis Date  ? Anxiety   ? Arthritis   ? Asthma   ? COPD (chronic obstructive pulmonary disease) (HCC)   ? Depression   ? GERD (gastroesophageal reflux disease)   ? ?Past Surgical History:  ?Procedure Laterality Date  ? ABDOMINAL HYSTERECTOMY  61 yrs old  ? JOINT REPLACEMENT Right  May 2015  ? Shoulder replacement  ? TONSILLECTOMY AND ADENOIDECTOMY  Age 20  ? ?Patient Active Problem List  ? Diagnosis Date Noted  ? Obesity 08/22/2015  ? Lumbar degenerative disc disease 08/22/2015  ? GERD (gastroesophageal reflux disease) 04/25/2015  ? Arthritis 12/20/2014  ? Hyperlipemia 10/30/2014  ? Chronic obstructive pulmonary disease (HCC) 03/29/2014  ? Current tobacco use 03/29/2014  ? ? ? ?PCP: Patient, No Pcp Per (Inactive) ? ?REFERRING PROVIDER: Etheleen NicksMacDonald, Keri, NP ? ?REFERRING DIAGNOSIS: Dizziness and giddiness ? ?THERAPY DIAG: Dizziness and giddiness ? ?ONSET DATE: "Years ago" ? ?FOLLOW UP APPT WITH PROVIDER: Yes, regular visits scheduled  ? ? ?FROM INITIAL EVALUATION:  ? ?SUBJECTIVE:  ? ?Chief Complaint:  ?Vertigo with laying down and rolling in bed.  ? ?Pertinent History ?Pt complains of severe vertigo when laying down and rolling in bed. This has been ongoing for years and pt was treated at this clinic previously for this issue. In the past pt would experience nausea and vomiting during attempted testing and treatment and was unable to tolerate full treatment to achieve complete remission of symptoms. Pt also has a history of chronic L sided low back pain and participated in physical therapy at this clinic with limited benefit. She also has a history of R shoulder replacement. ?Description of dizziness: vertigo ?Frequency: weekly ?Duration:  seconds ?Symptom nature: positional ?Progression of symptoms since onset: no change ?History of similar episodes: Yes ? ?Provocative Factors: quick positional changes ?Easing Factors: Waiting for symptoms to pass ? ?Auditory complaints (tinnitus, pain, drainage, hearing loss, aural fullness): Yes, fluid in L ear currently. Pt is on an antibiotic for a sinus infection. With the sinus infection she reports L aural fullness but no difficulty hearing ?Vision (diplopia, visual field loss, recent changes, recent eye exam): Yes, floaters in L eye which are chronic. Eye exam last year. No double vision. No recent significant changes in vision. ? ?Has patient fallen in last 6 months? No ?Pertinent pain: Yes, chronic back pain ?Dominant hand: right ?Imaging: No, no recent imaging related to referral ?Prior level of function: Independent for ADLs/IADLs at home but has to move slowly. ?Occupational demands: Not working ?Hobbies: spending time with dog, playing video games ? ?Red Flags (dysarthria, dysphagia, drop attacks, bowel and bladder changes, recent weight loss/gain): Review of systems negative for red flags.  ? ?PRECAUTIONS: None ? ?WEIGHT BEARING RESTRICTIONS No ? ?LIVING ENVIRONMENT: ?Lives with:  lives with friend ?Lives in: House/apartment ?Stairs: Yes; External: 2 steps; none ? ?PATIENT GOALS ?Improve vertigo ? ? ?OBJECTIVE ? ?EXAMINATION ? 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 ? ?NEUROLOGICAL SCREEN: (2+ unless otherwise noted.) N=normal  Ab=abnormal ? ?Level Dermatome R L Myotome R L Reflex R L  ?C3 Anterior Neck N N Sidebend C2-3 N N Jaw CN V    ?C4 Top of Shoulder N N Shoulder Shrug  C4 N N Hoffman?s UMN    ?C5 Lateral Upper Arm N N Shoulder ABD C4-5 N N Biceps C5-6    ?C6 Lateral Arm/ Thumb N N Arm Flex/ Wrist Ext C5-6 N N Brachiorad. C5-6    ?C7 Middle Finger N N Arm Ext//Wrist Flex C6-7 N N Triceps C7    ?C8 4th & 5th Finger  N N Flex/ Ext Carpi Ulnaris C8 N N Patellar (L3-4)    ?T1 Medial Arm N N Interossei T1 N N Gastrocnemius    ?L2 Medial thigh/groin N N Illiopsoas (L2-3) N N     ?L3 Lower thigh/med.knee N N Quadriceps (L3-4) N N     ?L4 Medial leg/lat thigh N N Tibialis Ant (L4-5) N N     ?L5 Lat. leg & dorsal foot N N EHL (L5) N N     ?S1 post/lat foot/thigh/leg N N Gastrocnemius (S1-2) N N     ?S2 Post./med. thigh & leg N N Hamstrings (L4-S3) N N     ? ? ?CRANIAL NERVES ?Deferred ? ?SOMATOSENSORY ?Intact to light touch throughout BUE/BLE. Proprioception and hot/cold testing deferred on this date. ? ?COORDINATION ?Deferred ? ?RANGE OF MOTION ?No focal deficits in AROM noted in BUE/BLE ? ?MANUAL MUSCLE TESTING ?BUE/BLE strength WNL without focal deficits ? ?TRANSFERS/GAIT ?Independent for transfers and ambulation without assistive device  ? ? ?PATIENT SURVEYS ?FOTO: 39, predicted improvement to 54 ?DHI: 68/100 ? ? ?POSTURAL CONTROL TESTS ?Clinical Test of Sensory Interaction for Balance (CTSIB): Deferred ? ? ?OCULOMOTOR / VESTIBULAR TESTING ? ?Oculomotor Exam- Room Light: Deferred ? ?Oculomotor Exam- Fixation Suppressed: Deferred ? ? ?BPPV TESTS: ? Symptoms Duration Intensity Nystagmus  ?L Dix-Hallpike Vertigo 30s Mild Downbeating L torsional  ?R Dix-Hallpike Vertigo 30s Severe Upbeating R torsional with nausea and vomiting  ?L Head Roll      ?R Head Roll      ?L Sidelying Test      ?R Sidelying Test      ?(blank = not tested) ? ? ?FUNCTIONAL OUTCOME MEASURES ? ? Results Comments  ?BERG    ?DGI    ?FGA    ?TUG    ?5TSTS    ?6 Minute Walk Test    ?10 Meter Gait Speed    ?(blank = not tested) ? ? ?TODAY'S TREATMENT  ? ?SUBJECTIVE: Pt reports that she is doing well today. No changes since the initial evaluation. She took her anti-emetic prior to coming in for therapy to help with the nausea during the canalith repositioning maneuvers. No specific questions or concerns currently.  ? ?PAIN: None related to this issue ? ?Canalith  Repositioning Treatment ?Right Dix-hallpike test is positive for vigorous upbeating R torsional nystagmus with concurrent vertigo consistent with R posterior canal BPPV. Nystagmus and vertigo start to resolve after about 15-20s however 10s later vigorous nystagmus returns with severe vertigo and nausea. Pt has to sit up and begins and starts to vomit. Therapist provided wet towel to patient and nausea improves after 5 minutes. Afterward performed R sidelying test which is positive for upbeating R torsional nystagmus with concurrent vertigo. Pt experiences nausea but never vomits. Proceeded to perform R Liberatory maneuver with 1 minute holds in each position. Repeated R sidelying test and it remains positive with vigorous upbeating R torsional nystagmus with vertigo. Pt proceeds to sit up and vomit again. After vomiting finishes and pt recovers she is able to tolerate one additional round of R Liberatory maneuver. Pt reports she cannot tolerate a third  maneuver due to nausea.  ? ? ?PATIENT EDUCATION:  ?Education details: Plan of care and BPPV ?Person educated: Patient ?Education method: Explanation ?Education comprehension: verbalized understanding  ? ? ?HOME EXERICSE PROGRAM ?None ? ? ?ASSESSMENT: ? ?CLINICAL IMPRESSION: ?Right Dix-hallpike test is positive for vigorous upbeating R torsional nystagmus with concurrent vertigo consistent with R posterior canal BPPV. Nystagmus and vertigo start to resolve after about 15-20s however 10s later vigorous nystagmus returns with severe vertigo and nausea. Pt has to sit up and begins and starts to vomit. Therapist provided wet towel to patient and nausea improves after 5 minutes. Afterward performed R sidelying test which is positive for upbeating R torsional nystagmus with concurrent vertigo. Pt experiences nausea but never vomits. Proceeded to perform R Liberatory maneuver with 1 minute holds in each position. Repeated R sidelying test and it remains positive with vigorous  upbeating R torsional nystagmus with vertigo. Pt proceeds to sit up and vomit again. After vomiting finishes and pt recovers she is able to tolerate one additional round of R Liberatory maneuver. Pt reports she cann

## 2022-03-25 ENCOUNTER — Ambulatory Visit: Payer: Medicare Other

## 2022-03-25 DIAGNOSIS — R42 Dizziness and giddiness: Secondary | ICD-10-CM

## 2022-03-31 NOTE — Therapy (Incomplete)
OUTPATIENT PHYSICAL THERAPY VESTIBULAR TREATMENT   Patient Name: Kelsey Rose MRN: [id number] DOB:10/24/1961, 61 y.o., female Today's Date: 03/31/2022  PCP: Patient, No Pcp Per (Inactive) REFERRING PROVIDER: Etheleen Nicks, NP     Past Medical History:  Diagnosis Date   Anxiety    Arthritis    Asthma    COPD (chronic obstructive pulmonary disease) (HCC)    Depression    GERD (gastroesophageal reflux disease)    Past Surgical History:  Procedure Laterality Date   ABDOMINAL HYSTERECTOMY  61 yrs old   JOINT REPLACEMENT Right  May 2015   Shoulder replacement   TONSILLECTOMY AND ADENOIDECTOMY  Age 61   Patient Active Problem List   Diagnosis Date Noted   Obesity 08/22/2015   Lumbar degenerative disc disease 08/22/2015   GERD (gastroesophageal reflux disease) 04/25/2015   Arthritis 12/20/2014   Hyperlipemia 10/30/2014   Chronic obstructive pulmonary disease (HCC) 03/29/2014   Current tobacco use 03/29/2014     PCP: Patient, No Pcp Per (Inactive)  REFERRING PROVIDER: Etheleen Nicks, NP  REFERRING DIAGNOSIS: Dizziness and giddiness  THERAPY DIAG: Dizziness and giddiness  ONSET DATE: "Years ago"  FOLLOW UP APPT WITH PROVIDER: Yes, regular visits scheduled    FROM INITIAL EVALUATION:   SUBJECTIVE:   Chief Complaint:  Vertigo with laying down and rolling in bed.   Pertinent History Pt complains of severe vertigo when laying down and rolling in bed. This has been ongoing for years and pt was treated at this clinic previously for this issue. In the past pt would experience nausea and vomiting during attempted testing and treatment and was unable to tolerate full treatment to achieve complete remission of symptoms. Pt also has a history of chronic L sided low back pain and participated in physical therapy at this clinic with limited benefit. She also has a history of R shoulder replacement. Description of dizziness: vertigo Frequency: weekly Duration:  seconds Symptom nature: positional Progression of symptoms since onset: no change History of similar episodes: Yes  Provocative Factors: quick positional changes Easing Factors: Waiting for symptoms to pass  Auditory complaints (tinnitus, pain, drainage, hearing loss, aural fullness): Yes, fluid in L ear currently. Pt is on an antibiotic for a sinus infection. With the sinus infection she reports L aural fullness but no difficulty hearing Vision (diplopia, visual field loss, recent changes, recent eye exam): Yes, floaters in L eye which are chronic. Eye exam last year. No double vision. No recent significant changes in vision.  Has patient fallen in last 6 months? No Pertinent pain: Yes, chronic back pain Dominant hand: right Imaging: No, no recent imaging related to referral Prior level of function: Independent for ADLs/IADLs at home but has to move slowly. Occupational demands: Not working Hobbies: spending time with dog, playing video games  [employer] (dysarthria, dysphagia, drop attacks, bowel and bladder changes, recent weight loss/gain): Review of systems negative for red flags.   PRECAUTIONS: None  WEIGHT BEARING RESTRICTIONS No  LIVING ENVIRONMENT: Lives with:  lives with friend Lives in: House/apartment Stairs: Yes; External: 2 steps; none  PATIENT GOALS Improve vertigo   OBJECTIVE  EXAMINATION  POSTURE: No gross deficits contributing to symptoms however pt does have upper thoracic kyphosis with forward head posture.  NEUROLOGICAL SCREEN: (2+ unless otherwise noted.) N=normal  Ab=abnormal  Level Dermatome R L Myotome R L Reflex R L  C3 Anterior Neck N N Sidebend C2-3 N N Jaw CN V    C4 Top of Shoulder N N Shoulder Shrug  C4 N N Hoffman's UMN    C5 Lateral Upper Arm N N Shoulder ABD C4-5 N N Biceps C5-6    C6 Lateral Arm/ Thumb N N Arm Flex/ Wrist Ext C5-6 N N Brachiorad. C5-6    C7 Middle Finger N N Arm Ext//Wrist Flex C6-7 N N Triceps C7    C8 4th & 5th Finger  N N Flex/ Ext Carpi Ulnaris C8 N N Patellar (L3-4)    T1 Medial Arm N N Interossei T1 N N Gastrocnemius    L2 Medial thigh/groin N N Illiopsoas (L2-3) N N     L3 Lower thigh/med.knee N N Quadriceps (L3-4) N N     L4 Medial leg/lat thigh N N Tibialis Ant (L4-5) N N     L5 Lat. leg & dorsal foot N N EHL (L5) N N     S1 post/lat foot/thigh/leg N N Gastrocnemius (S1-2) N N     S2 Post./med. thigh & leg N N Hamstrings (L4-S3) N N       CRANIAL NERVES Deferred  SOMATOSENSORY Intact to light touch throughout BUE/BLE. Proprioception and hot/cold testing deferred on this date.  COORDINATION Deferred  RANGE OF MOTION No focal deficits in AROM noted in BUE/BLE  MANUAL MUSCLE TESTING BUE/BLE strength WNL without focal deficits  TRANSFERS/GAIT Independent for transfers and ambulation without assistive device    PATIENT SURVEYS FOTO: 39, predicted improvement to 54 DHI: 68/100   POSTURAL CONTROL TESTS Clinical Test of Sensory Interaction for Balance (CTSIB): Deferred   OCULOMOTOR / VESTIBULAR TESTING  Oculomotor Exam- Room Light: Deferred  Oculomotor Exam- Fixation Suppressed: Deferred   BPPV TESTS:  Symptoms Duration Intensity Nystagmus  L Dix-Hallpike Vertigo 30s Mild Downbeating L torsional  R Dix-Hallpike Vertigo 30s Severe Upbeating R torsional with nausea and vomiting  L Head Roll      R Head Roll      L Sidelying Test      R Sidelying Test      (blank = not tested)   FUNCTIONAL OUTCOME MEASURES   Results Comments  BERG    DGI    FGA    TUG    5TSTS    6 Minute Walk Test    10 Meter Gait Speed    (blank = not tested)   TODAY'S TREATMENT   SUBJECTIVE: Pt reports that she is doing well today. No changes since the initial evaluation. She took her anti-emetic prior to coming in for therapy to help with the nausea during the canalith repositioning maneuvers. No specific questions or concerns currently.   PAIN: None related to this issue  Canalith  Repositioning Treatment Right Dix-hallpike test is positive for vigorous upbeating R torsional nystagmus with concurrent vertigo consistent with R posterior canal BPPV. Nystagmus and vertigo start to resolve after about 15-20s however 10s later vigorous nystagmus returns with severe vertigo and nausea. Pt has to sit up and begins and starts to vomit. Therapist provided wet towel to patient and nausea improves after 5 minutes. Afterward performed R sidelying test which is positive for upbeating R torsional nystagmus with concurrent vertigo. Pt experiences nausea but never vomits. Proceeded to perform R Liberatory maneuver with 1 minute holds in each position. Repeated R sidelying test and it remains positive with vigorous upbeating R torsional nystagmus with vertigo. Pt proceeds to sit up and vomit again. After vomiting finishes and pt recovers she is able to tolerate one additional round of R Liberatory maneuver. Pt reports she cannot tolerate a third  maneuver due to nausea.    PATIENT EDUCATION:  Education details: Plan of care and BPPV Person educated: Patient Education method: Explanation Education comprehension: verbalized understanding    HOME EXERICSE PROGRAM None   ASSESSMENT:  CLINICAL IMPRESSION: Right Dix-hallpike test is positive for vigorous upbeating R torsional nystagmus with concurrent vertigo consistent with R posterior canal BPPV. Nystagmus and vertigo start to resolve after about 15-20s however 10s later vigorous nystagmus returns with severe vertigo and nausea. Pt has to sit up and begins and starts to vomit. Therapist provided wet towel to patient and nausea improves after 5 minutes. Afterward performed R sidelying test which is positive for upbeating R torsional nystagmus with concurrent vertigo. Pt experiences nausea but never vomits. Proceeded to perform R Liberatory maneuver with 1 minute holds in each position. Repeated R sidelying test and it remains positive with vigorous  upbeating R torsional nystagmus with vertigo. Pt proceeds to sit up and vomit again. After vomiting finishes and pt recovers she is able to tolerate one additional round of R Liberatory maneuver. Pt reports she cannot tolerate a third maneuver due to nausea. .Pt will benefit from PT services to address deficits in vertigo in order to improve symptom-free function at home.   REHAB POTENTIAL: Good  CLINICAL DECISION MAKING: Stable/uncomplicated  EVALUATION COMPLEXITY: Low   GOALS: Goals reviewed with patient? Yes  SHORT TERM GOALS: Target date: 04/28/2022  Pt will be independent with HEP in order to improve strength and balance in order to decrease fall risk and improve function at home. Baseline:  Goal status: INITIAL   LONG TERM GOALS: Target date: 05/26/2022  Pt will increase FOTO to at least 54 to demonstrate significant improvement in function at home related to dizziness.  Baseline: 02/17/2022: 39 Goal status: INITIAL  2.  Pt will decrease DHI score by at least 18 points in order to demonstrate clinically significant reduction in disability  Baseline: 02/17/2022: 68/100 Goal status: INITIAL  3.  Pt will report at least 75% improvement in her vertigo symptoms in order to improve symptom-free function at home and with leisure activities Baseline:  Goal status: INITIAL    PLAN:  PT FREQUENCY: 1x/week  PT DURATION: 8 weeks  PLANNED INTERVENTIONS: Therapeutic exercises, Therapeutic activity, Neuromuscular re-education, Balance training, Gait training, Patient/Family education, Joint manipulation, Joint mobilization, Canalith repositioning, Aquatic Therapy, Dry Needling, Cognitive remediation, Electrical stimulation, Spinal manipulation, Spinal mobilization, Cryotherapy, Moist heat, Traction, Ultrasound, Ionotophoresis 4mg /ml Dexamethasone, and Manual therapy  PLAN FOR NEXT SESSION: BPPV testing with treatment as indicated   Sharalyn InkJason D Cearra Portnoy PT, DPT, G[facility]   Cynthya Yam 03/31/2022, 6:32 PM

## 2022-04-01 ENCOUNTER — Ambulatory Visit: Payer: Medicare Other

## 2022-04-01 DIAGNOSIS — R42 Dizziness and giddiness: Secondary | ICD-10-CM

## 2022-04-07 DIAGNOSIS — M5416 Radiculopathy, lumbar region: Secondary | ICD-10-CM | POA: Insufficient documentation

## 2022-04-08 ENCOUNTER — Ambulatory Visit: Payer: Medicare Other

## 2022-04-28 DIAGNOSIS — F418 Other specified anxiety disorders: Secondary | ICD-10-CM | POA: Insufficient documentation

## 2022-04-28 DIAGNOSIS — G8929 Other chronic pain: Secondary | ICD-10-CM | POA: Insufficient documentation

## 2022-04-28 DIAGNOSIS — Z87891 Personal history of nicotine dependence: Secondary | ICD-10-CM | POA: Insufficient documentation

## 2022-04-28 DIAGNOSIS — Z8639 Personal history of other endocrine, nutritional and metabolic disease: Secondary | ICD-10-CM | POA: Insufficient documentation

## 2022-04-28 DIAGNOSIS — F331 Major depressive disorder, recurrent, moderate: Secondary | ICD-10-CM | POA: Insufficient documentation

## 2022-04-28 DIAGNOSIS — M545 Low back pain, unspecified: Secondary | ICD-10-CM | POA: Insufficient documentation

## 2022-04-28 DIAGNOSIS — F332 Major depressive disorder, recurrent severe without psychotic features: Secondary | ICD-10-CM | POA: Insufficient documentation

## 2022-04-28 DIAGNOSIS — H8110 Benign paroxysmal vertigo, unspecified ear: Secondary | ICD-10-CM | POA: Insufficient documentation

## 2022-04-28 DIAGNOSIS — R7989 Other specified abnormal findings of blood chemistry: Secondary | ICD-10-CM | POA: Insufficient documentation

## 2023-01-19 ENCOUNTER — Ambulatory Visit (INDEPENDENT_AMBULATORY_CARE_PROVIDER_SITE_OTHER): Payer: 59 | Admitting: Physician Assistant

## 2023-01-19 ENCOUNTER — Encounter: Payer: Self-pay | Admitting: Physician Assistant

## 2023-01-19 VITALS — BP 138/82 | HR 77 | Ht 66.0 in | Wt 226.0 lb

## 2023-01-19 DIAGNOSIS — R5383 Other fatigue: Secondary | ICD-10-CM

## 2023-01-19 DIAGNOSIS — R7989 Other specified abnormal findings of blood chemistry: Secondary | ICD-10-CM

## 2023-01-19 DIAGNOSIS — K529 Noninfective gastroenteritis and colitis, unspecified: Secondary | ICD-10-CM | POA: Diagnosis not present

## 2023-01-19 DIAGNOSIS — E782 Mixed hyperlipidemia: Secondary | ICD-10-CM

## 2023-01-19 DIAGNOSIS — F418 Other specified anxiety disorders: Secondary | ICD-10-CM

## 2023-01-19 DIAGNOSIS — F5101 Primary insomnia: Secondary | ICD-10-CM | POA: Diagnosis not present

## 2023-01-19 MED ORDER — MIRTAZAPINE 7.5 MG PO TABS: 7.5000 mg | ORAL_TABLET | Freq: Every day | ORAL | 0 refills | Status: DC

## 2023-01-19 NOTE — Progress Notes (Addendum)
Date:  01/19/2023   Name:  Kelsey Rose   DOB:  11/25/1960   MRN:  [id number]   Chief Complaint: Insomnia, diarrhea  HPI Kelsey Rose is a pleasant 62 year old female with a history of depression, anxiety, COPD, chronic back pain due to lumbar DDD, vitamin D deficiency who is here to establish care and discuss some of her chronic conditions.  Main concerns today are GI related.  Previously received [facility] through [facility], but insurance has changed and [facility] is no longer in network.    Chronic diarrhea - ongoing for the last 3 years, last visit with GI was May 2023 planned for colonoscopy, but procedure was canceled due to transportation/communication issues; patient would like to be referred back to Dr. Servando Snare who performed previous colonoscopy in 2019 significant for polyps.  Reports lactose intolerant and uses Lactaid but drinks whole chocolate milk anyways because it "fills her up" when she does not feel like eating - usually drinks 2-3 glasses per day. Was using magnesium for sleep, but stopped a month ago after it stopped working.  Previously tried Bentyl for IBS which was stopped after 3 months due to lack of improvement, currently using Imodium as needed which she finds helpful.  Does not eat many whole fruits or vegetables, especially recently  Insomnia - struggles with both sleep onset and sleep maintenance.  Feels tired during the day but cannot sleep at night.  Trazodone caused nightmares.  Has also been prescribed doxepin but this was not covered by insurance.  She has been told she snores during the night, has never had sleep study.   Grief reaction - daughter passed away 08-28-2022 and patient has really been struggling with this, diagnosed with PTSD per her report, just completed therapy recently, not currently seeing behavioral health.  Currently endorses anhedonia, loss of motivation, poor appetite, sleep issues, loss of purpose though she does fine some comfort taking care of her 2  grandchildren and expects to have a great grandchild within the year  Depression/anxiety - patient has taken citalopram for many years, stable dose.     Recent Labs     Component Value Date/Time   NA 143 12/20/2014 0000   K 4.9 12/20/2014 0000   BUN 11 12/20/2014 0000   CREATININE 0.9 12/20/2014 0000   AST 14 12/20/2014 0000   ALT 12 12/20/2014 0000   ALKPHOS 96 12/20/2014 0000    Lab Results  Component Value Date   WBC 8.0 08/21/2014   HGB 14.5 08/21/2014   HCT 43 08/21/2014   PLT 198 08/21/2014   Lab Results  Component Value Date   HGBA1C 5.5 12/20/2014   Lab Results  Component Value Date   CHOL 246 (A) 04/24/2015   HDL 44 04/24/2015   LDLCALC 161 04/24/2015   TRIG 206 (A) 04/24/2015   Lab Results  Component Value Date   TSH 1.16 08/21/2014    Review of Systems  Patient Active Problem List   Diagnosis Date Noted   Benign paroxysmal positional vertigo 04/28/2022   Chronic bilateral low back pain without sciatica 04/28/2022   Anxiety associated with depression 04/28/2022   Low vitamin D level 04/28/2022   Personal history of nicotine dependence 04/28/2022   Lumbar radiculopathy 04/07/2022   Chronic diarrhea 02/16/2022   Obesity 08/22/2015   Lumbar degenerative disc disease 08/22/2015   GERD (gastroesophageal reflux disease) 04/25/2015   Arthritis 12/20/2014   Hyperlipemia 10/30/2014   Chronic obstructive pulmonary disease (HCC) 03/29/2014   Current  tobacco use 03/29/2014   Asthma 06/05/2013   Shoulder joint pain 11/18/2012    Allergies  Allergen Reactions   Sulfa Antibiotics Swelling    Developed swelling in eyes when using Sulfa eye drops.   Iodinated Contrast Media Nausea Only   Penicillins Nausea And Vomiting    Got really sick, throwing up as child    Past Surgical History:  Procedure Laterality Date   ABDOMINAL HYSTERECTOMY  62 yrs old   JOINT REPLACEMENT Right  May 2015   Shoulder replacement   TONSILLECTOMY AND ADENOIDECTOMY  Age 42     Social History   Tobacco Use   Smoking status: Every Day    Packs/day: 1.00    Years: 53.00    Total pack years: 53.00    Types: Cigarettes  Vaping Use   Vaping Use: Never used  Substance Use Topics   Alcohol use: No   Drug use: Not Currently     Medication list has been reviewed and updated.  Current Meds  Medication Sig   acetaminophen (TYLENOL) 650 MG CR tablet Take 650 mg by mouth every 8 (eight) hours as needed for pain.   albuterol (ACCUNEB) 1.25 MG/3ML nebulizer solution Take 1 ampule by nebulization every 6 (six) hours as needed for wheezing.   albuterol (PROVENTIL HFA;VENTOLIN HFA) 108 (90 Base) MCG/ACT inhaler Inhale 2 puffs into the lungs every 6 (six) hours as needed for wheezing or shortness of breath.   bismuth subsalicylate (PEPTO BISMOL) 262 MG chewable tablet Chew 524 mg by mouth as needed.   celecoxib (CELEBREX) 200 MG capsule Take 200 mg by mouth 2 (two) times daily.   cetirizine (ZYRTEC) 5 MG chewable tablet Chew 5 mg by mouth daily.   Cholecalciferol (VITAMIN D3) 50 MCG (2000 UT) CAPS Take by mouth.   citalopram (CELEXA) 40 MG tablet Take 40 mg by mouth daily.   guaiFENesin (MUCINEX) 600 MG 12 hr tablet Take by mouth 2 (two) times daily.   lidocaine (LIDODERM) 5 % Place 1 patch onto the skin daily. Remove & Discard patch within 12 hours or as directed by MD   loperamide (IMODIUM A-D) 2 MG tablet Take 2 mg by mouth 4 (four) times daily as needed for diarrhea or loose stools.   Melatonin 3 MG CAPS Take by mouth.   mirtazapine (REMERON) 7.5 MG tablet Take 1 tablet (7.5 mg total) by mouth at bedtime.   nystatin powder Apply 1 Application topically 3 (three) times daily. PRN   ondansetron (ZOFRAN) 4 MG tablet Take 4 mg by mouth every 8 (eight) hours as needed for nausea or vomiting.   sodium chloride (OCEAN) 0.65 % SOLN nasal spray Place 1 spray into both nostrils as needed for congestion.   Tiotropium Bromide-Olodaterol (STIOLTO RESPIMAT) 2.5-2.5 MCG/ACT  AERS Inhale 2 each into the lungs daily.   [DISCONTINUED] Magnesium 200 MG CHEW Chew by mouth.       01/19/2023    3:07 PM  GAD 7 : Generalized Anxiety Score  Nervous, Anxious, on Edge 3  Control/stop worrying 3  Worry too much - different things 3  Trouble relaxing 0  Restless 2  Easily annoyed or irritable 3  Afraid - awful might happen 0  Total GAD 7 Score 14  Anxiety Difficulty Not difficult at all       01/19/2023    3:07 PM  Depression screen PHQ 2/9  Decreased Interest 3  Down, Depressed, Hopeless 3  PHQ - 2 Score 6  Altered sleeping 3  Tired, decreased energy 3  Change in appetite 3  Feeling bad or failure about yourself  0  Trouble concentrating 0  Moving slowly or fidgety/restless 0  Suicidal thoughts 0  PHQ-9 Score 15  Difficult doing work/chores Somewhat difficult    BP Readings from Last 3 Encounters:  01/19/23 138/82  01/09/17 137/65  10/22/15 (!) 142/76    Physical Exam Constitutional:      Appearance: Normal appearance.  HENT:     Right Ear: Tympanic membrane normal.     Left Ear: Tympanic membrane normal.  Cardiovascular:     Rate and Rhythm: Normal rate and regular rhythm.     Heart sounds: Normal heart sounds.  Pulmonary:     Effort: Pulmonary effort is normal.     Breath sounds: Normal breath sounds.  Abdominal:     General: Bowel sounds are normal. There is no distension.     Palpations: Abdomen is soft.     Tenderness: There is no abdominal tenderness.  Psychiatric:        Mood and Affect: Mood is depressed. Affect is tearful.     Wt Readings from Last 3 Encounters:  01/19/23 226 lb (102.5 kg)  01/09/17 226 lb (102.5 kg)  10/22/15 224 lb (101.6 kg)    BP 138/82   Pulse 77   Ht '5\' 6"'$  (1.676 m)   Wt 226 lb (102.5 kg)   SpO2 98%   BMI 36.48 kg/m   Assessment and Plan:  1. Chronic diarrhea of unknown origin Likely multifactorial.  Discussed possible etiologies/contributing factors with patient including use of magnesium,  ingesting lactose despite lactose intolerance, IBS, poor fiber intake, and adequately managed grief/stress/anxiety.  Encouraged regular consumption of whole fruits and vegetables, continue Imodium as desired, referring to GI for further evaluation and she is also due for colonoscopy. - Ambulatory referral to Gastroenterology  2. Mixed hyperlipidemia Discussed with patient her last labs showed high cholesterol and also her routine LDCT showed atherosclerosis.  Patient was not made aware of either of these problems, but might be willing to start statin therapy if appropriate.  Will check nonfasting lipids today and decide on pharmacotherapy at next visit - Lipid panel  3. Primary insomnia Also likely multifactorial.  May be related to depression/anxiety.  Consider also sleep apnea given patient's reported history of snoring, referring for sleep study to evaluate.  Will also try mirtazapine low-dose which is likely to have benefits for depression, appetite, and insomnia - Ambulatory referral to Sleep Studies - mirtazapine (REMERON) 7.5 MG tablet; Take 1 tablet (7.5 mg total) by mouth at bedtime.  Dispense: 30 tablet; Refill: 0  4. Low vitamin D level Patient stopped vitamin D supplementation at least a month ago, recheck levels today - VITAMIN D 25 Hydroxy (Vit-D Deficiency, Fractures)  5. Other fatigue Checking baseline labs today to evaluate for anemia, thyroid dysfunction, etc. discussed with patient I believe sleep has a lot to do with this.  If we can correct her sleep, I anticipate her fatigue will dramatically improve. - CBC with Differential/Platelet - Comprehensive metabolic panel - TSH - Ambulatory referral to Sleep Studies  6. Anxiety associated with depression Plan as discussed above, PHQ-9 score today 15 indicative of moderate to severe depression despite.  Exacerbated by grief.  Mirtazapine may offer some benefit.  Consider referral to behavioral health in the future if this is not  improving in the coming months. - mirtazapine (REMERON) 7.5 MG tablet; Take 1 tablet (7.5 mg total) by mouth  at bedtime.  Dispense: 30 tablet; Refill: 0   Return in about 1 month (around 02/19/2023) for insomnia, depression.   Partially dictated using Editor, commissioning. Any errors are unintentional.  Lupita Leash, PA-C, [facility]

## 2023-01-20 LAB — COMPREHENSIVE METABOLIC PANEL
ALT: 13 IU/L (ref 0–32)
AST: 15 IU/L (ref 0–40)
Albumin/Globulin Ratio: 1.5 (ref 1.2–2.2)
Albumin: 4.2 g/dL (ref 3.8–4.9)
Alkaline Phosphatase: 116 IU/L (ref 44–121)
BUN/Creatinine Ratio: 13 (ref 12–28)
BUN: 11 mg/dL (ref 8–27)
Bilirubin Total: <0.2 mg/dL (ref 0.0–1.2)
CO2: 24 mmol/L (ref 20–29)
Calcium: 9.8 mg/dL (ref 8.7–10.3)
Chloride: 99 mmol/L (ref 96–106)
Creatinine, Ser: 0.84 mg/dL (ref 0.57–1.00)
Globulin, Total: 2.8 g/dL (ref 1.5–4.5)
Glucose: 89 mg/dL (ref 70–99)
Potassium: 5 mmol/L (ref 3.5–5.2)
Sodium: 139 mmol/L (ref 134–144)
Total Protein: 7 g/dL (ref 6.0–8.5)
eGFR: 80 mL/min/{1.73_m2} (ref >59)

## 2023-01-20 LAB — CBC WITH DIFFERENTIAL/PLATELET
Basophils Absolute: 0 10*3/uL (ref 0.0–0.2)
Basos: 0 %
EOS (ABSOLUTE): 0.2 10*3/uL (ref 0.0–0.4)
Eos: 2 %
Hematocrit: 44.5 % (ref 34.0–46.6)
Hemoglobin: 15.3 g/dL (ref 11.1–15.9)
Immature Grans (Abs): 0 10*3/uL (ref 0.0–0.1)
Immature Granulocytes: 0 %
Lymphocytes Absolute: 1.7 10*3/uL (ref 0.7–3.1)
Lymphs: 23 %
MCH: 31.2 pg (ref 26.6–33.0)
MCHC: 34.4 g/dL (ref 31.5–35.7)
MCV: 91 fL (ref 79–97)
Monocytes Absolute: 0.5 10*3/uL (ref 0.1–0.9)
Monocytes: 7 %
Neutrophils Absolute: 4.9 10*3/uL (ref 1.4–7.0)
Neutrophils: 68 %
Platelets: 234 10*3/uL (ref 150–450)
RBC: 4.9 x10E6/uL (ref 3.77–5.28)
RDW: 13 % (ref 11.7–15.4)
WBC: 7.2 10*3/uL (ref 3.4–10.8)

## 2023-01-20 LAB — LIPID PANEL
Chol/HDL Ratio: 5.6 ratio — ABNORMAL HIGH (ref 0.0–4.4)
Cholesterol, Total: 268 mg/dL — ABNORMAL HIGH (ref 100–199)
HDL: 48 mg/dL (ref >39)
LDL Chol Calc (NIH): 168 mg/dL — ABNORMAL HIGH (ref 0–99)
Triglycerides: 278 mg/dL — ABNORMAL HIGH (ref 0–149)
VLDL Cholesterol Cal: 52 mg/dL — ABNORMAL HIGH (ref 5–40)

## 2023-01-20 LAB — VITAMIN D 25 HYDROXY (VIT D DEFICIENCY, FRACTURES): Vit D, 25-Hydroxy: 37.7 ng/mL (ref 30.0–100.0)

## 2023-01-26 LAB — TSH: TSH: 1.81 u[IU]/mL (ref 0.450–4.500)

## 2023-01-26 LAB — SPECIMEN STATUS REPORT

## 2023-01-27 ENCOUNTER — Other Ambulatory Visit: Payer: Self-pay | Admitting: Physician Assistant

## 2023-01-27 DIAGNOSIS — E782 Mixed hyperlipidemia: Secondary | ICD-10-CM

## 2023-01-27 MED ORDER — ATORVASTATIN CALCIUM 20 MG PO TABS: 20.0000 mg | ORAL_TABLET | Freq: Every day | ORAL | 1 refills | Status: DC

## 2023-02-19 ENCOUNTER — Ambulatory Visit (INDEPENDENT_AMBULATORY_CARE_PROVIDER_SITE_OTHER): Payer: 59 | Admitting: Physician Assistant

## 2023-02-19 ENCOUNTER — Encounter: Payer: Self-pay | Admitting: Physician Assistant

## 2023-02-19 VITALS — BP 128/78 | HR 95 | Temp 98.2°F | Ht 66.0 in | Wt 234.0 lb

## 2023-02-19 DIAGNOSIS — F331 Major depressive disorder, recurrent, moderate: Secondary | ICD-10-CM | POA: Diagnosis not present

## 2023-02-19 DIAGNOSIS — M545 Low back pain, unspecified: Secondary | ICD-10-CM

## 2023-02-19 DIAGNOSIS — F99 Mental disorder, not otherwise specified: Secondary | ICD-10-CM

## 2023-02-19 DIAGNOSIS — G8929 Other chronic pain: Secondary | ICD-10-CM

## 2023-02-19 DIAGNOSIS — F5105 Insomnia due to other mental disorder: Secondary | ICD-10-CM | POA: Diagnosis not present

## 2023-02-19 DIAGNOSIS — F411 Generalized anxiety disorder: Secondary | ICD-10-CM | POA: Insufficient documentation

## 2023-02-19 DIAGNOSIS — F17209 Nicotine dependence, unspecified, with unspecified nicotine-induced disorders: Secondary | ICD-10-CM

## 2023-02-19 DIAGNOSIS — Z6837 Body mass index (BMI) 37.0-37.9, adult: Secondary | ICD-10-CM

## 2023-02-19 MED ORDER — CELECOXIB 200 MG PO CAPS
200.0000 mg | ORAL_CAPSULE | Freq: Every day | ORAL | 1 refills | Status: DC
Start: 2023-02-19 — End: 2023-03-19

## 2023-02-19 MED ORDER — MIRTAZAPINE 7.5 MG PO TABS
7.5000 mg | ORAL_TABLET | Freq: Every day | ORAL | 1 refills | Status: DC
Start: 2023-02-19 — End: 2023-03-19

## 2023-02-19 NOTE — Progress Notes (Signed)
Date:  02/19/2023   Name:  Kelsey Rose   DOB:  1961-10-23   MRN:  [id number]   Chief Complaint: Insomnia (Sleeps 10 hours a night now, has more energy )  HPI Kelsey Rose returns for 1 month follow-up on some of her chronic conditions.  Last visit 01/19/2023 we discussed her chronic diarrhea and referred to Dr. Servando Snare who she has seen in the past; she has not yet heard from them to schedule.  Still drinking whole chocolate milk despite lactose intolerance, continues Lactaid.   Labs were essentially normal last time with the exception of hyperlipidemia for which we started atorvastatin 20 mg, well-tolerated so far.  We also started on mirtazapine last visit due to her insomnia (sleep onset and maintenance).  Patient states that the mirtazapine has been very effective for sleep, reporting that she feels rested in the morning with more energy.  She does not report increased appetite or notable change to caloric intake, though she has gained 8 pounds by our scale over the last month.  Patient believes she has sleep study arranged for June to evaluate for possible OSA.  Unfortunately, treating the insomnia seems to have brought more attention to her chronic back pain, which is the main concern for today's visit.  She is unsure if it is truly exacerbated or if she just has more time and energy to focus on it. Celecoxib is listed on her med list, but she is quite certain she does not take it, and does not think she has used it before. Not using any NSAIDs (Rx or OTC) at this time. Tylenol arthritis minimally effective. Uses a TENS unit along with stretching, lidocaine patches, IcyHot. Also has other chronic joint pain including R shoulder which was previously replaced.   Chronic depression is about the same with citalopram and now mirtazapine, possibly slightly improved, exacerbated by grief reaction after losing her daughter in October 2023.   Recent Labs     Component Value Date/Time   NA 139  01/19/2023 1608   K 5.0 01/19/2023 1608   CL 99 01/19/2023 1608   CO2 24 01/19/2023 1608   GLUCOSE 89 01/19/2023 1608   BUN 11 01/19/2023 1608   CREATININE 0.84 01/19/2023 1608   CALCIUM 9.8 01/19/2023 1608   PROT 7.0 01/19/2023 1608   ALBUMIN 4.2 01/19/2023 1608   AST 15 01/19/2023 1608   ALT 13 01/19/2023 1608   ALKPHOS 116 01/19/2023 1608   BILITOT <0.2 01/19/2023 1608    Lab Results  Component Value Date   WBC 7.2 01/19/2023   HGB 15.3 01/19/2023   HCT 44.5 01/19/2023   MCV 91 01/19/2023   PLT 234 01/19/2023   Lab Results  Component Value Date   HGBA1C 5.5 12/20/2014   Lab Results  Component Value Date   CHOL 268 (H) 01/19/2023   HDL 48 01/19/2023   LDLCALC 168 (H) 01/19/2023   TRIG 278 (H) 01/19/2023   CHOLHDL 5.6 (H) 01/19/2023   Lab Results  Component Value Date   TSH 1.810 01/19/2023    Review of Systems  Patient Active Problem List   Diagnosis Date Noted   Benign paroxysmal positional vertigo 04/28/2022   Chronic bilateral low back pain without sciatica 04/28/2022   Anxiety associated with depression 04/28/2022   History of vitamin D deficiency 04/28/2022   Personal history of nicotine dependence 04/28/2022   Lumbar radiculopathy 04/07/2022   Chronic diarrhea 02/16/2022   Obesity 08/22/2015   Lumbar degenerative disc disease  08/22/2015   GERD (gastroesophageal reflux disease) 04/25/2015   Arthritis 12/20/2014   Hyperlipemia 10/30/2014   Chronic obstructive pulmonary disease 03/29/2014   Current tobacco use 03/29/2014   Asthma 06/05/2013   Shoulder joint pain 11/18/2012    Allergies  Allergen Reactions   Sulfa Antibiotics Swelling    Developed swelling in eyes when using Sulfa eye drops.   Iodinated Contrast Media Nausea Only   Penicillins Nausea And Vomiting    Got really sick, throwing up as child    Past Surgical History:  Procedure Laterality Date   ABDOMINAL HYSTERECTOMY  62 yrs old   JOINT REPLACEMENT Right  May 2015   Shoulder  replacement   TONSILLECTOMY AND ADENOIDECTOMY  Age 56    Social History   Tobacco Use   Smoking status: Every Day    Packs/day: 1.00    Years: 53.00    Additional pack years: 0.00    Total pack years: 53.00    Types: Cigarettes  Vaping Use   Vaping Use: Never used  Substance Use Topics   Alcohol use: No   Drug use: Not Currently     Medication list has been reviewed and updated.  Current Meds  Medication Sig   albuterol (ACCUNEB) 1.25 MG/3ML nebulizer solution Take 1 ampule by nebulization every 6 (six) hours as needed for wheezing.   albuterol (PROVENTIL HFA;VENTOLIN HFA) 108 (90 Base) MCG/ACT inhaler Inhale 2 puffs into the lungs every 6 (six) hours as needed for wheezing or shortness of breath.   atorvastatin (LIPITOR) 20 MG tablet Take 1 tablet (20 mg total) by mouth daily.   bismuth subsalicylate (PEPTO BISMOL) 262 MG chewable tablet Chew 524 mg by mouth as needed.   celecoxib (CELEBREX) 200 MG capsule Take 200 mg by mouth 2 (two) times daily.   cetirizine (ZYRTEC) 5 MG chewable tablet Chew 5 mg by mouth daily.   Cholecalciferol (VITAMIN D3) 50 MCG (2000 UT) CAPS Take by mouth.   citalopram (CELEXA) 40 MG tablet Take 40 mg by mouth daily.   lidocaine (LIDODERM) 5 % Place 1 patch onto the skin daily. Remove & Discard patch within 12 hours or as directed by MD   loperamide (IMODIUM A-D) 2 MG tablet Take 2 mg by mouth 4 (four) times daily as needed for diarrhea or loose stools.   mirtazapine (REMERON) 7.5 MG tablet Take 1 tablet (7.5 mg total) by mouth at bedtime.   nystatin powder Apply 1 Application topically 3 (three) times daily. PRN   ondansetron (ZOFRAN) 4 MG tablet Take 4 mg by mouth every 8 (eight) hours as needed for nausea or vomiting.   sodium chloride (OCEAN) 0.65 % SOLN nasal spray Place 1 spray into both nostrils as needed for congestion.   Tiotropium Bromide-Olodaterol (STIOLTO RESPIMAT) 2.5-2.5 MCG/ACT AERS Inhale 2 each into the lungs daily.        02/19/2023    1:17 PM 01/19/2023    3:07 PM  GAD 7 : Generalized Anxiety Score  Nervous, Anxious, on Edge 2 3  Control/stop worrying 3 3  Worry too much - different things 3 3  Trouble relaxing 2 0  Restless 2 2  Easily annoyed or irritable 3 3  Afraid - awful might happen 1 0  Total GAD 7 Score 16 14  Anxiety Difficulty Not difficult at all Not difficult at all       02/19/2023    1:16 PM 01/19/2023    3:07 PM  Depression screen PHQ 2/9  Decreased  Interest 3 3  Down, Depressed, Hopeless 3 3  PHQ - 2 Score 6 6  Altered sleeping 2 3  Tired, decreased energy 2 3  Change in appetite 3 3  Feeling bad or failure about yourself  0 0  Trouble concentrating 0 0  Moving slowly or fidgety/restless 0 0  Suicidal thoughts 0 0  PHQ-9 Score 13 15  Difficult doing work/chores Somewhat difficult Somewhat difficult    BP Readings from Last 3 Encounters:  02/19/23 128/78  01/19/23 138/82  01/09/17 137/65    Wt Readings from Last 3 Encounters:  02/19/23 234 lb (106.1 kg)  01/19/23 226 lb (102.5 kg)  01/09/17 226 lb (102.5 kg)    BP 128/78   Pulse 95   Temp 98.2 F (36.8 C)   Ht 5\' 6"  (1.676 m)   Wt 234 lb (106.1 kg)   SpO2 100%   BMI 37.77 kg/m   Physical Exam   Assessment and Plan:  1. Insomnia due to other mental disorder Continue mirtazapine for now. It seems that it is doing very well for her sleep, which I think should be prioritized due to her concurrent mood disorders - mirtazapine (REMERON) 7.5 MG tablet; Take 1 tablet (7.5 mg total) by mouth at bedtime.  Dispense: 90 tablet; Refill: 1  2. Moderate episode of recurrent major depressive disorder Plan as above, continue mirtazapine for now. - mirtazapine (REMERON) 7.5 MG tablet; Take 1 tablet (7.5 mg total) by mouth at bedtime.  Dispense: 90 tablet; Refill: 1  3. Chronic bilateral low back pain without sciatica Will restart Celebrex once daily to see if this improves her back pain and arthritis.  Discussed with  patient we may need to refer to spine specialist for additional management in the future - celecoxib (CELEBREX) 200 MG capsule; Take 1 capsule (200 mg total) by mouth daily.  Dispense: 30 capsule; Refill: 1  4. Class 2 severe obesity with serious comorbidity and body mass index (BMI) of 37.0 to 37.9 in adult, unspecified obesity type Discussed with patient that she has gained weight over the last month, but we will trend based on her weight next visit.  Encouraged dietary modification and activity as tolerated  5.  Tobacco use disorder, continuous Discussed briefly with the patient today, not interested in quitting at this time.  50+ pack years of smoking with chronic lung conditions.  Described the relationship between smoking and pro-inflammatory state, emphasizing that while it is probably not the cause of her pain, it is likely contributing to the clinical picture.  Will continue to revisit the topic at future visits.  F/u 21m OV back pain, sleep, weight.  Plan to schedule fasting CPE in the months following  Partially dictated using Animal nutritionist. Any errors are unintentional.  Alvester Morin, PA-C, DMSc, Nutritionist [facility] Sports [facility] [phone]

## 2023-02-19 NOTE — Patient Instructions (Signed)
-  I am glad mirtazapine has helped with sleep. Remember it can cause weight gain so let's watch for that. Try to continue activity as tolerated without aggravating your back -Let's try celecoxib (Celebrex) for your back and joint pain. Take it once per day. Continue your daily stretching.  -Consider reducing cigarette smoking -Come back in about 4 weeks to check in

## 2023-02-22 ENCOUNTER — Telehealth: Payer: Self-pay | Admitting: Gastroenterology

## 2023-02-22 NOTE — Telephone Encounter (Signed)
Patient has been scheduled for August 13th at 2:30pm.

## 2023-02-22 NOTE — Telephone Encounter (Signed)
Patient has a referral for chronic diarrhea of unknown origin. Patient called wanting to schedule an appointment but she is currently established with [facility]. I asked the patient why she would like to transfer and she states that she is transferring all of her care from [facility] to [facility] and would like to schedule with Dr. Servando Snare. Will you see this patient?

## 2023-03-02 ENCOUNTER — Telehealth: Payer: Self-pay | Admitting: Physician Assistant

## 2023-03-02 NOTE — Telephone Encounter (Signed)
Copied from CRM [id number]. Topic: Medicare AWV >> Mar 02, 2023  1:56 PM Payton Doughty wrote: Reason for CRM: Called patient to schedule Medicare Annual Wellness Visit (AWV). No voicemail available to leave a message.  Last date of AWV: NONE  Please schedule an appointment at any time with Kennedy Bucker, LPN  .  If any questions, please contact me.  Thank you ,  Kennedy Bucker, LPN

## 2023-03-03 ENCOUNTER — Ambulatory Visit (INDEPENDENT_AMBULATORY_CARE_PROVIDER_SITE_OTHER): Payer: 59

## 2023-03-03 VITALS — Ht 66.0 in | Wt 234.0 lb

## 2023-03-03 DIAGNOSIS — F17210 Nicotine dependence, cigarettes, uncomplicated: Secondary | ICD-10-CM

## 2023-03-03 DIAGNOSIS — Z0000 Encounter for general adult medical examination without abnormal findings: Secondary | ICD-10-CM

## 2023-03-03 NOTE — Patient Instructions (Signed)
Kelsey Rose , Thank you for taking time to come for your Medicare Wellness Visit. I appreciate your ongoing commitment to your health goals. Please review the following plan we discussed and let me know if I can assist you in the future.   These are the goals we discussed:  Goals      DIET - EAT MORE FRUITS AND VEGETABLES        This is a list of the screening recommended for you and due dates:  Health Maintenance  Topic Date Due   COVID-19 Vaccine (1) Never done   HIV Screening  Never done   DTaP/Tdap/Td vaccine (1 - Tdap) Never done   Screening for Lung Cancer  Never done   Zoster (Shingles) Vaccine (1 of 2) 04/21/2023*   Flu Shot  06/10/2023   Mammogram  02/17/2024   Medicare Annual Wellness Visit  03/02/2024   Pap Smear  04/16/2024   Colon Cancer Screening  06/02/2028   Hepatitis C Screening: USPSTF Recommendation to screen - Ages 18-79 yo.  Completed   HPV Vaccine  Aged Out  *Topic was postponed. The date shown is not the original due date.    Advanced directives: no  Conditions/risks identified: none  Next appointment: Follow up in one year for your annual wellness visit. 03/08/24 @ 1:00 pm by phone  Preventive Care 40-64 Years, Female Preventive care refers to lifestyle choices and visits with your health care provider that can promote health and wellness. What does preventive care include? A yearly physical exam. This is also called an annual well check. Dental exams once or twice a year. Routine eye exams. Ask your health care provider how often you should have your eyes checked. Personal lifestyle choices, including: Daily care of your teeth and gums. Regular physical activity. Eating a healthy diet. Avoiding tobacco and drug use. Limiting alcohol use. Practicing safe sex. Taking low-dose aspirin daily starting at age 10. Taking vitamin and mineral supplements as recommended by your health care provider. What happens during an annual well check? The services  and screenings done by your health care provider during your annual well check will depend on your age, overall health, lifestyle risk factors, and family history of disease. Counseling  Your health care provider may ask you questions about your: Alcohol use. Tobacco use. Drug use. Emotional well-being. Home and relationship well-being. Sexual activity. Eating habits. Work and work Astronomer. Method of birth control. Menstrual cycle. Pregnancy history. Screening  You may have the following tests or measurements: Height, weight, and BMI. Blood pressure. Lipid and cholesterol levels. These may be checked every 5 years, or more frequently if you are over 37 years old. Skin check. Lung cancer screening. You may have this screening every year starting at age 28 if you have a 30-pack-year history of smoking and currently smoke or have quit within the past 15 years. Fecal occult blood test (FOBT) of the stool. You may have this test every year starting at age 70. Flexible sigmoidoscopy or colonoscopy. You may have a sigmoidoscopy every 5 years or a colonoscopy every 10 years starting at age 85. Hepatitis C blood test. Hepatitis B blood test. Sexually transmitted disease (STD) testing. Diabetes screening. This is done by checking your blood sugar (glucose) after you have not eaten for a while (fasting). You may have this done every 1-3 years. Mammogram. This may be done every 1-2 years. Talk to your health care provider about when you should start having regular mammograms. This may depend  on whether you have a family history of breast cancer. BRCA-related cancer screening. This may be done if you have a family history of breast, ovarian, tubal, or peritoneal cancers. Pelvic exam and Pap test. This may be done every 3 years starting at age 48. Starting at age 61, this may be done every 5 years if you have a Pap test in combination with an HPV test. Bone density scan. This is done to screen  for osteoporosis. You may have this scan if you are at high risk for osteoporosis. Discuss your test results, treatment options, and if necessary, the need for more tests with your health care provider. Vaccines  Your health care provider may recommend certain vaccines, such as: Influenza vaccine. This is recommended every year. Tetanus, diphtheria, and acellular pertussis (Tdap, Td) vaccine. You may need a Td booster every 10 years. Zoster vaccine. You may need this after age 58. Pneumococcal 13-valent conjugate (PCV13) vaccine. You may need this if you have certain conditions and were not previously vaccinated. Pneumococcal polysaccharide (PPSV23) vaccine. You may need one or two doses if you smoke cigarettes or if you have certain conditions. Talk to your health care provider about which screenings and vaccines you need and how often you need them. This information is not intended to replace advice given to you by your health care provider. Make sure you discuss any questions you have with your health care provider. Document Released: 11/22/2015 Document Revised: 07/15/2016 Document Reviewed: 08/27/2015 Elsevier Interactive Patient Education  2017 [employer].    Fall Prevention in the Home Falls can cause injuries. They can happen to people of all ages. There are many things you can do to make your home safe and to help prevent falls. What can I do on the outside of my home? Regularly fix the edges of walkways and driveways and fix any cracks. Remove anything that might make you trip as you walk through a door, such as a raised step or threshold. Trim any bushes or trees on the path to your home. Use bright outdoor lighting. Clear any walking paths of anything that might make someone trip, such as rocks or tools. Regularly check to see if handrails are loose or broken. Make sure that both sides of any steps have handrails. Any raised decks and porches should have guardrails on the  edges. Have any leaves, snow, or ice cleared regularly. Use sand or salt on walking paths during winter. Clean up any spills in your garage right away. This includes oil or grease spills. What can I do in the bathroom? Use night lights. Install grab bars by the toilet and in the tub and shower. Do not use towel bars as grab bars. Use non-skid mats or decals in the tub or shower. If you need to sit down in the shower, use a plastic, non-slip stool. Keep the floor dry. Clean up any water that spills on the floor as soon as it happens. Remove soap buildup in the tub or shower regularly. Attach bath mats securely with double-sided non-slip rug tape. Do not have throw rugs and other things on the floor that can make you trip. What can I do in the bedroom? Use night lights. Make sure that you have a light by your bed that is easy to reach. Do not use any sheets or blankets that are too big for your bed. They should not hang down onto the floor. Have a firm chair that has side arms. You can use  this for support while you get dressed. Do not have throw rugs and other things on the floor that can make you trip. What can I do in the kitchen? Clean up any spills right away. Avoid walking on wet floors. Keep items that you use a lot in easy-to-reach places. If you need to reach something above you, use a strong step stool that has a grab bar. Keep electrical cords out of the way. Do not use floor polish or wax that makes floors slippery. If you must use wax, use non-skid floor wax. Do not have throw rugs and other things on the floor that can make you trip. What can I do with my stairs? Do not leave any items on the stairs. Make sure that there are handrails on both sides of the stairs and use them. Fix handrails that are broken or loose. Make sure that handrails are as long as the stairways. Check any carpeting to make sure that it is firmly attached to the stairs. Fix any carpet that is loose or  worn. Avoid having throw rugs at the top or bottom of the stairs. If you do have throw rugs, attach them to the floor with carpet tape. Make sure that you have a light switch at the top of the stairs and the bottom of the stairs. If you do not have them, ask someone to add them for you. What else can I do to help prevent falls? Wear shoes that: Do not have high heels. Have rubber bottoms. Are comfortable and fit you well. Are closed at the toe. Do not wear sandals. If you use a stepladder: Make sure that it is fully opened. Do not climb a closed stepladder. Make sure that both sides of the stepladder are locked into place. Ask someone to hold it for you, if possible. Clearly mark and make sure that you can see: Any grab bars or handrails. First and last steps. Where the edge of each step is. Use tools that help you move around (mobility aids) if they are needed. These include: Canes. Walkers. Scooters. Crutches. Turn on the lights when you go into a dark area. Replace any light bulbs as soon as they burn out. Set up your furniture so you have a clear path. Avoid moving your furniture around. If any of your floors are uneven, fix them. If there are any pets around you, be aware of where they are. Review your medicines with your doctor. Some medicines can make you feel dizzy. This can increase your chance of falling. Ask your doctor what other things that you can do to help prevent falls. This information is not intended to replace advice given to you by your health care provider. Make sure you discuss any questions you have with your health care provider. Document Released: 08/22/2009 Document Revised: 04/02/2016 Document Reviewed: 11/30/2014 Elsevier Interactive Patient Education  2017 [employer].

## 2023-03-03 NOTE — Progress Notes (Signed)
I connected with  Kelsey Rose on 03/03/23 by a audio enabled telemedicine application and verified that I am speaking with the correct person using two identifiers.  Patient Location: Home  Provider Location: Office/Clinic  I discussed the limitations of evaluation and management by telemedicine. The patient expressed understanding and agreed to proceed.  Subjective:   Kelsey Rose is a 62 y.o. female who presents for Medicare Annual (Subsequent) preventive examination.  Review of Systems     Cardiac Risk Factors include: advanced age (>66men, >23 women);smoking/ tobacco exposure     Objective:    Today's Vitals   03/03/23 1259 03/03/23 1321  Weight:  234 lb (106.1 kg)  Height:  5\' 6"  (1.676 m)  PainSc: 6     Body mass index is 37.77 kg/m.     03/03/2023    1:07 PM 01/09/2017    2:15 PM  Advanced Directives  Does Patient Have a Medical Advance Directive? No No  Would patient like information on creating a medical advance directive? No - Patient declined No - Patient declined    Current Medications (verified) Outpatient Encounter Medications as of 03/03/2023  Medication Sig   albuterol (ACCUNEB) 1.25 MG/3ML nebulizer solution Take 1 ampule by nebulization every 6 (six) hours as needed for wheezing.   albuterol (PROVENTIL HFA;VENTOLIN HFA) 108 (90 Base) MCG/ACT inhaler Inhale 2 puffs into the lungs every 6 (six) hours as needed for wheezing or shortness of breath.   atorvastatin (LIPITOR) 20 MG tablet Take 1 tablet (20 mg total) by mouth daily.   bismuth subsalicylate (PEPTO BISMOL) 262 MG chewable tablet Chew 524 mg by mouth as needed.   celecoxib (CELEBREX) 200 MG capsule Take 1 capsule (200 mg total) by mouth daily.   cetirizine (ZYRTEC) 5 MG chewable tablet Chew 5 mg by mouth daily.   Cholecalciferol (VITAMIN D3) 50 MCG (2000 UT) CAPS Take by mouth.   citalopram (CELEXA) 40 MG tablet Take 40 mg by mouth daily.   lidocaine (LIDODERM) 5 % Place 1 patch onto the  skin daily. Remove & Discard patch within 12 hours or as directed by MD   loperamide (IMODIUM A-D) 2 MG tablet Take 2 mg by mouth 4 (four) times daily as needed for diarrhea or loose stools.   mirtazapine (REMERON) 7.5 MG tablet Take 1 tablet (7.5 mg total) by mouth at bedtime.   nystatin powder Apply 1 Application topically 3 (three) times daily. PRN   ondansetron (ZOFRAN) 4 MG tablet Take 4 mg by mouth every 8 (eight) hours as needed for nausea or vomiting.   sodium chloride (OCEAN) 0.65 % SOLN nasal spray Place 1 spray into both nostrils as needed for congestion.   Tiotropium Bromide-Olodaterol (STIOLTO RESPIMAT) 2.5-2.5 MCG/ACT AERS Inhale 2 each into the lungs daily.   No facility-administered encounter medications on file as of 03/03/2023.    Allergies (verified) Sulfa antibiotics, Iodinated contrast media, and Penicillins   History: Past Medical History:  Diagnosis Date   Anxiety    Arthritis    Asthma    COPD (chronic obstructive pulmonary disease)    Depression    GERD (gastroesophageal reflux disease)    Past Surgical History:  Procedure Laterality Date   ABDOMINAL HYSTERECTOMY  62 yrs old   JOINT REPLACEMENT Right  May 2015   Shoulder replacement   TONSILLECTOMY AND ADENOIDECTOMY  Age 56   Family History  Problem Relation Age of Onset   Cirrhosis Father    Depression Sister    COPD  Sister    Diabetes Daughter    Depression Sister    COPD Sister    Gallstones Mother    Dementia Mother    Osteoarthritis Mother    Heart attack Brother    Social History   Socioeconomic History   Marital status: Single    Spouse name: Not on file   Number of children: 1   Years of education: Not on file   Highest education level: GED or equivalent  Occupational History   Not on file  Tobacco Use   Smoking status: Every Day    Packs/day: 1.00    Years: 53.00    Additional pack years: 0.00    Total pack years: 53.00    Types: Cigarettes   Smokeless tobacco: Not on file   Vaping Use   Vaping Use: Never used  Substance and Sexual Activity   Alcohol use: No   Drug use: Not Currently   Sexual activity: Not Currently    Birth control/protection: None  Other Topics Concern   Not on file  Social History Narrative   Not on file   Social Determinants of Health   Financial Resource Strain: Low Risk  (03/03/2023)   Overall Financial Resource Strain (CARDIA)    Difficulty of Paying Living Expenses: Not hard at all  Recent Concern: Financial Resource Strain - Medium Risk (02/16/2023)   Overall Financial Resource Strain (CARDIA)    Difficulty of Paying Living Expenses: Somewhat hard  Food Insecurity: No Food Insecurity (03/03/2023)   Hunger Vital Sign    Worried About Running Out of Food in the Last Year: Never true    Ran Out of Food in the Last Year: Never true  Recent Concern: Food Insecurity - Food Insecurity Present (02/16/2023)   Hunger Vital Sign    Worried About Running Out of Food in the Last Year: Sometimes true    Ran Out of Food in the Last Year: Sometimes true  Transportation Needs: No Transportation Needs (03/03/2023)   PRAPARE - Administrator, Civil Service (Medical): No    Lack of Transportation (Non-Medical): No  Physical Activity: Insufficiently Active (03/03/2023)   Exercise Vital Sign    Days of Exercise per Week: 3 days    Minutes of Exercise per Session: 30 min  Stress: No Stress Concern Present (03/03/2023)   [employer] of Occupational Health - Occupational Stress Questionnaire    Feeling of Stress : Only a little  Recent Concern: Stress - Stress Concern Present (02/16/2023)   [employer] of Occupational Health - Occupational Stress Questionnaire    Feeling of Stress : To some extent  Social Connections: Socially Isolated (03/03/2023)   Social Connection and Isolation Panel [NHANES]    Frequency of Communication with Friends and Family: More than three times a week    Frequency of Social Gatherings with Friends and  Family: More than three times a week    Attends Religious Services: Never    Database administrator or Organizations: No    Attends Engineer, structural: Never    Marital Status: Never married    Tobacco Counseling Ready to quit: Not Answered Counseling given: Not Answered   Clinical Intake:  Pre-visit preparation completed: Yes  Pain : 0-10 Pain Score: 6  Pain Type: Chronic pain Pain Location: Back Pain Orientation: Lower Pain Radiating Towards: and hip     Nutritional Risks: None Diabetes: No  How often do you need to have someone help you when you  read instructions, pamphlets, or other written materials from your doctor or pharmacy?: 1 - Never  Diabetic?no  Interpreter Needed?: No  Information entered by :: Kennedy Bucker, LPN   Activities of Daily Living    03/03/2023    1:09 PM  In your present state of health, do you have any difficulty performing the following activities:  Hearing? 0  Vision? 0  Difficulty concentrating or making decisions? 0  Walking or climbing stairs? 1  Dressing or bathing? 0  Doing errands, shopping? 0  Preparing Food and eating ? N  Using the Toilet? N  In the past six months, have you accidently leaked urine? N  Do you have problems with loss of bowel control? N  Managing your Medications? N  Managing your Finances? N  Housekeeping or managing your Housekeeping? N    Patient Care Team: Remo Lipps, PA as PCP - General (Physician Assistant)  Indicate any recent Medical Services you may have received from other than [facility] providers in the past year (date may be approximate).     Assessment:   This is a routine wellness examination for Ramatoulaye.  Hearing/Vision screen Hearing Screening - Comments:: No aids Vision Screening - Comments:: Wears glasses- Dr.Woodard  Dietary issues and exercise activities discussed: Current Exercise Habits: The patient does not participate in regular exercise at present   Goals  Addressed             This Visit's Progress    DIET - EAT MORE FRUITS AND VEGETABLES         Depression Screen    03/03/2023    1:04 PM 02/19/2023    1:16 PM 01/19/2023    3:07 PM  PHQ 2/9 Scores  PHQ - 2 Score PHQ- 9 Score Fall Risk    03/03/2023    1:08 PM 02/19/2023    1:18 PM 01/19/2023    3:07 PM  Fall Risk   Falls in the past year? 1 0 0  Comment vertigo    Number falls in past yr: 0 0 0  Injury with Fall? 0 0 0  Risk for fall due to : No Fall Risks;History of fall(s);Other (Comment)  History of fall(s)  Risk for fall due to: Comment vertigo    Follow up Falls prevention discussed;Falls evaluation completed  Falls evaluation completed    FALL RISK PREVENTION PERTAINING TO THE HOME:  Any stairs in or around the home? No  If so, are there any without handrails? No  Home free of loose throw rugs in walkways, pet beds, electrical cords, etc? Yes  Adequate lighting in your home to reduce risk of falls? Yes   ASSISTIVE DEVICES UTILIZED TO PREVENT FALLS:  Life alert? No  Use of a cane, walker or w/c? Yes - walker sometimes Grab bars in the bathroom? Yes  Shower chair or bench in shower? Yes  Elevated toilet seat or a handicapped toilet? No    Cognitive Function:        03/03/2023    1:15 PM  6CIT Screen  What Year? 0 points  What month? 0 points  What time? 0 points  Count back from 20 0 points  Months in reverse 0 points  Repeat phrase 0 points  Total Score 0 points    Immunizations Immunization History  Administered Date(s) Administered   Influenza-Unspecified 10/22/2015   Pneumococcal Polysaccharide-23 08/30/2014    TDAP status: Due, Education has been provided  regarding the importance of this vaccine. Advised may receive this vaccine at local pharmacy or Health Dept. Aware to provide a copy of the vaccination record if obtained from local pharmacy or Health Dept. Verbalized acceptance and understanding.  Flu Vaccine status:  Declined, Education has been provided regarding the importance of this vaccine but patient still declined. Advised may receive this vaccine at local pharmacy or Health Dept. Aware to provide a copy of the vaccination record if obtained from local pharmacy or Health Dept. Verbalized acceptance and understanding.  Pneumococcal vaccine status: Declined,  Education has been provided regarding the importance of this vaccine but patient still declined. Advised may receive this vaccine at local pharmacy or Health Dept. Aware to provide a copy of the vaccination record if obtained from local pharmacy or Health Dept. Verbalized acceptance and understanding.   Covid-19 vaccine status: Declined, Education has been provided regarding the importance of this vaccine but patient still declined. Advised may receive this vaccine at local pharmacy or Health Dept.or vaccine clinic. Aware to provide a copy of the vaccination record if obtained from local pharmacy or Health Dept. Verbalized acceptance and understanding.  Qualifies for Shingles Vaccine? Yes   Zostavax completed No   Shingrix Completed?: No.    Education has been provided regarding the importance of this vaccine. Patient has been advised to call insurance company to determine out of pocket expense if they have not yet received this vaccine. Advised may also receive vaccine at local pharmacy or Health Dept. Verbalized acceptance and understanding.  Screening Tests Health Maintenance  Topic Date Due   COVID-19 Vaccine (1) Never done   HIV Screening  Never done   DTaP/Tdap/Td (1 - Tdap) Never done   Lung Cancer Screening  Never done   Zoster Vaccines- Shingrix (1 of 2) 04/21/2023 (Originally 06/15/2012)   INFLUENZA VACCINE  06/10/2023   MAMMOGRAM  02/17/2024   Medicare Annual Wellness (AWV)  03/02/2024   PAP SMEAR-Modifier  04/16/2024   COLONOSCOPY (Pts 45-84yrs Insurance coverage will need to be confirmed)  06/02/2028   Hepatitis C Screening  Completed    HPV VACCINES  Aged Out    Health Maintenance  Health Maintenance Due  Topic Date Due   COVID-19 Vaccine (1) Never done   HIV Screening  Never done   DTaP/Tdap/Td (1 - Tdap) Never done   Lung Cancer Screening  Never done    Colorectal cancer screening: Type of screening: Colonoscopy. Completed 06/02/18. Repeat every 10 years- has one scheduled in August 2024  Mammogram status: Completed 02/16/22. Repeat every year- has one scheduled for October 2024    Lung Cancer Screening: (Low Dose CT Chest recommended if Age 71-80 years, 30 pack-year currently smoking OR have quit w/in 15years.) does qualify.   Lung Cancer Screening Referral: ordered 03/03/23  Additional Screening:  Hepatitis C Screening: does qualify; Completed 03/17/18  Vision Screening: Recommended annual ophthalmology exams for early detection of glaucoma and other disorders of the eye. Is the patient up to date with their annual eye exam?  Yes  Who is the provider or what is the name of the office in which the patient attends annual eye exams? Dr.Woodard If pt is not established with a provider, would they like to be referred to a provider to establish care? No .   Dental Screening: Recommended annual dental exams for proper oral hygiene  Community Resource Referral / Chronic Care Management: CRR required this visit?  No   CCM required this visit?  No  Plan:     I have personally reviewed and noted the following in the patient's chart:   Medical and social history Use of alcohol, tobacco or illicit drugs  Current medications and supplements including opioid prescriptions. Patient is not currently taking opioid prescriptions. Functional ability and status Nutritional status Physical activity Advanced directives List of other physicians Hospitalizations, surgeries, and ER visits in previous 12 months Vitals Screenings to include cognitive, depression, and falls Referrals and appointments  In  addition, I have reviewed and discussed with patient certain preventive protocols, quality metrics, and best practice recommendations. A written personalized care plan for preventive services as well as general preventive health recommendations were provided to patient.     Hal Hope, LPN   1/61/0960   Nurse Notes: none

## 2023-03-19 ENCOUNTER — Ambulatory Visit (INDEPENDENT_AMBULATORY_CARE_PROVIDER_SITE_OTHER): Payer: 59 | Admitting: Physician Assistant

## 2023-03-19 ENCOUNTER — Ambulatory Visit
Admission: RE | Admit: 2023-03-19 | Discharge: 2023-03-19 | Disposition: A | Discharge status: Home / Self Care | Payer: 59 | Source: Ambulatory Visit | Attending: Physician Assistant | Admitting: Physician Assistant

## 2023-03-19 ENCOUNTER — Ambulatory Visit
Admission: RE | Admit: 2023-03-19 | Discharge: 2023-03-19 | Disposition: A | Discharge status: Home / Self Care | Payer: 59 | Attending: Physician Assistant | Admitting: Physician Assistant

## 2023-03-19 ENCOUNTER — Encounter: Payer: Self-pay | Admitting: Physician Assistant

## 2023-03-19 VITALS — BP 104/78 | HR 83 | Temp 98.1°F | Ht 66.0 in | Wt 237.0 lb

## 2023-03-19 DIAGNOSIS — G8929 Other chronic pain: Secondary | ICD-10-CM | POA: Insufficient documentation

## 2023-03-19 DIAGNOSIS — M545 Low back pain, unspecified: Secondary | ICD-10-CM | POA: Diagnosis present

## 2023-03-19 DIAGNOSIS — Z6838 Body mass index (BMI) 38.0-38.9, adult: Secondary | ICD-10-CM

## 2023-03-19 DIAGNOSIS — M25512 Pain in left shoulder: Secondary | ICD-10-CM | POA: Diagnosis present

## 2023-03-19 DIAGNOSIS — F5105 Insomnia due to other mental disorder: Secondary | ICD-10-CM

## 2023-03-19 DIAGNOSIS — F99 Mental disorder, not otherwise specified: Secondary | ICD-10-CM

## 2023-03-19 MED ORDER — HYDROXYZINE PAMOATE 25 MG PO CAPS
25.0000 mg | ORAL_CAPSULE | Freq: Every evening | ORAL | 1 refills | Status: DC
Start: 2023-03-19 — End: 2023-04-14

## 2023-03-19 MED ORDER — DICLOFENAC SODIUM 50 MG PO TBEC
50.0000 mg | DELAYED_RELEASE_TABLET | Freq: Two times a day (BID) | ORAL | 1 refills | Status: DC
Start: 2023-03-19 — End: 2023-04-14

## 2023-03-19 NOTE — Patient Instructions (Signed)
-  It was a pleasure to see you today! Please review your visit summary for helpful information -I would encourage you to follow your care via MyChart where you can access lab results, notes, messages, and more -If you feel that we did a nice job today, please complete your after-visit survey and leave us a Google review! Your CMA today was Kieandra and your provider was Dan Khari Mally, PA-C, DMSc -Please return for follow-up in about 4 weeks  

## 2023-03-19 NOTE — Progress Notes (Signed)
Date:  03/19/2023   Name:  Kelsey Rose   DOB:  May 26, 1961   MRN:  [id number]   Chief Complaint: Back Pain, Insomnia (Pt is sleeping better ), and Weight Check (Gained 3 pounds since last visit )  Back Pain This is a recurrent problem. The current episode started in the past 7 days. The pain is at a severity of 4/10. The pain is mild. The pain is Worse during the day. The symptoms are aggravated by bending and twisting. Pertinent negatives include no abdominal pain, chest pain or fever. She has tried ice and heat (voltaren) for the symptoms. The treatment provided mild relief.   Emonnie returns for 1 month follow-up primarily to describe discuss insomnia and depression, obesity, and back pain.    We had started mirtazapine for her 01/19/2023 due to comorbid insomnia and depression, which was highly effective for sleep, however she has continued to demonstrate a trend of weight gain since that visit totaling 11 pounds in 2 months.  For this reason, she is seeking alternative sleep aid.  For back pain, we tried Celebrex last visit which was not effective, so patient stopped taking. States it has been years since she had XR. Continues to complain of L shoulder pain too.   Continues to crave chocolate milk, only recently stopped, has lactose intolerance. Uses Lactaid for her "white milk". She has now started drinking Sunny D.    Medication list has been reviewed and updated.  Current Meds  Medication Sig   albuterol (ACCUNEB) 1.25 MG/3ML nebulizer solution Take 1 ampule by nebulization every 6 (six) hours as needed for wheezing.   albuterol (PROVENTIL HFA;VENTOLIN HFA) 108 (90 Base) MCG/ACT inhaler Inhale 2 puffs into the lungs every 6 (six) hours as needed for wheezing or shortness of breath.   atorvastatin (LIPITOR) 20 MG tablet Take 1 tablet (20 mg total) by mouth daily.   bismuth subsalicylate (PEPTO BISMOL) 262 MG chewable tablet Chew 524 mg by mouth as needed.   cetirizine (ZYRTEC)  5 MG chewable tablet Chew 5 mg by mouth daily.   citalopram (CELEXA) 40 MG tablet Take 40 mg by mouth daily.   lidocaine (LIDODERM) 5 % Place 1 patch onto the skin daily. Remove & Discard patch within 12 hours or as directed by MD   loperamide (IMODIUM A-D) 2 MG tablet Take 2 mg by mouth 4 (four) times daily as needed for diarrhea or loose stools.   mirtazapine (REMERON) 7.5 MG tablet Take 1 tablet (7.5 mg total) by mouth at bedtime.   nystatin powder Apply 1 Application topically 3 (three) times daily. PRN   ondansetron (ZOFRAN) 4 MG tablet Take 4 mg by mouth every 8 (eight) hours as needed for nausea or vomiting.   Tiotropium Bromide-Olodaterol (STIOLTO RESPIMAT) 2.5-2.5 MCG/ACT AERS Inhale 2 each into the lungs daily.   [DISCONTINUED] Cholecalciferol (VITAMIN D3) 50 MCG (2000 UT) CAPS Take by mouth.     Review of Systems  Constitutional:  Positive for unexpected weight change. Negative for fatigue and fever.  Respiratory:  Negative for chest tightness and shortness of breath.   Cardiovascular:  Negative for chest pain and palpitations.  Gastrointestinal:  Negative for abdominal pain.  Musculoskeletal:  Positive for back pain.       Chronic lumbar and shoulder pain    Patient Active Problem List   Diagnosis Date Noted   Generalized anxiety disorder 02/19/2023   Insomnia due to other mental disorder 02/19/2023   Benign paroxysmal positional  vertigo 04/28/2022   Chronic bilateral low back pain without sciatica 04/28/2022   Moderate recurrent major depression (HCC) 04/28/2022   History of vitamin D deficiency 04/28/2022   Lumbar radiculopathy 04/07/2022   Chronic diarrhea 02/16/2022   Class 2 severe obesity with serious comorbidity and body mass index (BMI) of 37.0 to 37.9 in adult (HCC) 08/22/2015   Lumbar degenerative disc disease 08/22/2015   GERD (gastroesophageal reflux disease) 04/25/2015   Osteoarthritis, multiple sites 12/20/2014   Hyperlipemia 10/30/2014   Chronic  obstructive pulmonary disease (HCC) 03/29/2014   Tobacco use disorder, continuous 03/29/2014   Asthma 06/05/2013   Shoulder joint pain 11/18/2012    Allergies  Allergen Reactions   Sulfa Antibiotics Swelling    Developed swelling in eyes when using Sulfa eye drops.   Iodinated Contrast Media Nausea Only   Penicillins Nausea And Vomiting    Got really sick, throwing up as child    Immunization History  Administered Date(s) Administered   Influenza-Unspecified 10/22/2015   Pneumococcal Polysaccharide-23 08/30/2014    Past Surgical History:  Procedure Laterality Date   ABDOMINAL HYSTERECTOMY  62 yrs old   JOINT REPLACEMENT Right  May 2015   Shoulder replacement   TONSILLECTOMY AND ADENOIDECTOMY  Age 40    Social History   Tobacco Use   Smoking status: Every Day    Packs/day: 1.00    Years: 53.00    Additional pack years: 0.00    Total pack years: 53.00    Types: Cigarettes  Vaping Use   Vaping Use: Never used  Substance Use Topics   Alcohol use: No   Drug use: Not Currently    Family History  Problem Relation Age of Onset   Cirrhosis Father    Depression Sister    COPD Sister    Diabetes Daughter    Depression Sister    COPD Sister    Gallstones Mother    Dementia Mother    Osteoarthritis Mother    Heart attack Brother         02/19/2023    1:17 PM 01/19/2023    3:07 PM  GAD 7 : Generalized Anxiety Score  Nervous, Anxious, on Edge 2 3  Control/stop worrying 3 3  Worry too much - different things 3 3  Trouble relaxing 2 0  Restless 2 2  Easily annoyed or irritable 3 3  Afraid - awful might happen 1 0  Total GAD 7 Score 16 14  Anxiety Difficulty Not difficult at all Not difficult at all       03/03/2023    1:04 PM 02/19/2023    1:16 PM 01/19/2023    3:07 PM  Depression screen PHQ 2/9  Decreased Interest 0 3 3  Down, Depressed, Hopeless 1 3 3   PHQ - 2 Score 1 6 6   Altered sleeping 1 2 3   Tired, decreased energy 1 2 3   Change in appetite 0 3 3   Feeling bad or failure about yourself  0 0 0  Trouble concentrating 1 0 0  Moving slowly or fidgety/restless 0 0 0  Suicidal thoughts 0 0 0  PHQ-9 Score 4 13 15   Difficult doing work/chores Somewhat difficult Somewhat difficult Somewhat difficult    BP Readings from Last 3 Encounters:  03/19/23 104/78  02/19/23 128/78  01/19/23 138/82    Wt Readings from Last 3 Encounters:  03/19/23 237 lb (107.5 kg)  03/03/23 234 lb (106.1 kg)  02/19/23 234 lb (106.1 kg)    BP 104/78  Pulse 83   Temp 98.1 F (36.7 C) (Oral)   Ht 5\' 6"  (1.676 m)   Wt 237 lb (107.5 kg)   SpO2 96%   BMI 38.25 kg/m   Physical Exam Vitals and nursing note reviewed.  Constitutional:      Appearance: Normal appearance.  Pulmonary:     Effort: Pulmonary effort is normal.  Musculoskeletal:        General: Normal range of motion.  Skin:    General: Skin is warm and dry.  Neurological:     Mental Status: She is alert and oriented to person, place, and time.     Gait: Gait is intact.  Psychiatric:        Mood and Affect: Mood and affect normal.        Behavior: Behavior normal.     Recent Labs     Component Value Date/Time   NA 139 01/19/2023 1608   K 5.0 01/19/2023 1608   CL 99 01/19/2023 1608   CO2 24 01/19/2023 1608   GLUCOSE 89 01/19/2023 1608   BUN 11 01/19/2023 1608   CREATININE 0.84 01/19/2023 1608   CALCIUM 9.8 01/19/2023 1608   PROT 7.0 01/19/2023 1608   ALBUMIN 4.2 01/19/2023 1608   AST 15 01/19/2023 1608   ALT 13 01/19/2023 1608   ALKPHOS 116 01/19/2023 1608   BILITOT <0.2 01/19/2023 1608    Lab Results  Component Value Date   WBC 7.2 01/19/2023   HGB 15.3 01/19/2023   HCT 44.5 01/19/2023   MCV 91 01/19/2023   PLT 234 01/19/2023   Lab Results  Component Value Date   HGBA1C 5.5 12/20/2014   Lab Results  Component Value Date   CHOL 268 (H) 01/19/2023   HDL 48 01/19/2023   LDLCALC 168 (H) 01/19/2023   TRIG 278 (H) 01/19/2023   CHOLHDL 5.6 (H) 01/19/2023   Lab  Results  Component Value Date   TSH 1.810 01/19/2023     Assessment and Plan:  1. Insomnia due to other mental disorder Mirtazapine seems to have been very effective for sleep, unfortunately she continues to gain weight on this medication.  Discussed several options including:  trying half tablet mirtazapine nightly to see if this still improve sleep without potentiating as much weight gain Stopping mirtazapine and opting instead for hydroxyzine once nightly Continue mirtazapine, but switch her main antidepressant citalopram to weight neutral alternatives such as fluoxetine  Through shared decision-making, we will proceed with option two.  - hydrOXYzine (VISTARIL) 25 MG capsule; Take 1 capsule (25 mg total) by mouth at bedtime.  Dispense: 30 capsule; Refill: 1  2. Class 2 severe obesity with serious comorbidity and body mass index (BMI) of 38.0 to 38.9 in adult, unspecified obesity type (HCC) Plan as above.  Patient advised that Learta Codding D is not necessarily a suitable alternative to chocolate milk as it is still high in calories and processed sugar. Encouraged to focus on drinking water and occasionally Lactaid.   3. Chronic bilateral low back pain without sciatica Stop celecoxib, try diclofenac instead.  Patient advised to monitor for GI side effects, take with food.  Obtain lumbar x-ray today - DG Lumbar Spine Complete - diclofenac (VOLTAREN) 50 MG EC tablet; Take 1 tablet (50 mg total) by mouth 2 (two) times daily.  Dispense: 60 tablet; Refill: 1  4. Pain in joint of left shoulder Plan as above, obtain left shoulder x-ray today. - DG Shoulder Left - diclofenac (VOLTAREN) 50 MG EC tablet; Take  1 tablet (50 mg total) by mouth 2 (two) times daily.  Dispense: 60 tablet; Refill: 1   F/u 4wk OV chronic conditions. Need to schedule CPE in the coming months as well, once chronic conditions are stable enough.   Partially dictated using Animal nutritionist. Any errors are unintentional.  Alvester Morin, PA-C, DMSc, Nutritionist [facility] [phone]

## 2023-03-25 ENCOUNTER — Other Ambulatory Visit: Payer: Self-pay | Admitting: Physician Assistant

## 2023-03-25 ENCOUNTER — Encounter: Payer: Self-pay | Admitting: Physician Assistant

## 2023-03-25 DIAGNOSIS — M2570 Osteophyte, unspecified joint: Secondary | ICD-10-CM | POA: Insufficient documentation

## 2023-03-25 DIAGNOSIS — M8580 Other specified disorders of bone density and structure, unspecified site: Secondary | ICD-10-CM

## 2023-03-25 DIAGNOSIS — M159 Polyosteoarthritis, unspecified: Secondary | ICD-10-CM

## 2023-03-25 DIAGNOSIS — M810 Age-related osteoporosis without current pathological fracture: Secondary | ICD-10-CM | POA: Insufficient documentation

## 2023-03-25 DIAGNOSIS — M150 Primary generalized (osteo)arthritis: Secondary | ICD-10-CM

## 2023-03-25 DIAGNOSIS — M25512 Pain in left shoulder: Secondary | ICD-10-CM

## 2023-03-25 DIAGNOSIS — G8929 Other chronic pain: Secondary | ICD-10-CM

## 2023-03-25 DIAGNOSIS — I700 Atherosclerosis of aorta: Secondary | ICD-10-CM | POA: Insufficient documentation

## 2023-04-07 ENCOUNTER — Ambulatory Visit: Payer: 59

## 2023-04-08 ENCOUNTER — Ambulatory Visit: Payer: 59 | Attending: Physician Assistant

## 2023-04-08 DIAGNOSIS — M25512 Pain in left shoulder: Secondary | ICD-10-CM | POA: Diagnosis present

## 2023-04-08 DIAGNOSIS — M545 Low back pain, unspecified: Secondary | ICD-10-CM | POA: Diagnosis present

## 2023-04-08 DIAGNOSIS — M6281 Muscle weakness (generalized): Secondary | ICD-10-CM | POA: Insufficient documentation

## 2023-04-08 DIAGNOSIS — M159 Polyosteoarthritis, unspecified: Secondary | ICD-10-CM | POA: Diagnosis not present

## 2023-04-08 DIAGNOSIS — G8929 Other chronic pain: Secondary | ICD-10-CM | POA: Diagnosis present

## 2023-04-08 NOTE — Therapy (Addendum)
OUTPATIENT PHYSICAL THERAPY THORACOLUMBAR EVALUATION  Patient Name: Kelsey Rose MRN: [id number] DOB:08/31/1961, 62 y.o., female Today's Date: 04/11/2023  END OF SESSION:  PT End of Session - 04/11/23 1858     Visit Number 1    Number of Visits 17    Date for PT Re-Evaluation 06/03/23    Authorization Type eval: 04/08/23    PT Start Time 1453    PT Stop Time 1545    PT Time Calculation (min) 52 min    Activity Tolerance Patient tolerated treatment well    Behavior During Therapy WFL for tasks assessed/performed            Past Medical History:  Diagnosis Date   Anxiety    Arthritis    Asthma    COPD (chronic obstructive pulmonary disease) (HCC)    Depression    GERD (gastroesophageal reflux disease)    Past Surgical History:  Procedure Laterality Date   ABDOMINAL HYSTERECTOMY  62 yrs old   JOINT REPLACEMENT Right  May 2015   Shoulder replacement   TONSILLECTOMY AND ADENOIDECTOMY  Age 55   Patient Active Problem List   Diagnosis Date Noted   Aortic atherosclerosis (HCC) 03/25/2023   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 03/25/2023   Decreased bone density 03/25/2023   Generalized anxiety disorder 02/19/2023   Insomnia due to other mental disorder 02/19/2023   Benign paroxysmal positional vertigo 04/28/2022   Chronic bilateral low back pain without sciatica 04/28/2022   Moderate recurrent major depression (HCC) 04/28/2022   History of vitamin D deficiency 04/28/2022   Lumbar radiculopathy 04/07/2022   Chronic diarrhea 02/16/2022   Class 2 severe obesity with serious comorbidity and body mass index (BMI) of 37.0 to 37.9 in adult (HCC) 08/22/2015   Lumbar degenerative disc disease 08/22/2015   GERD (gastroesophageal reflux disease) 04/25/2015   Osteoarthritis, multiple sites 12/20/2014   Hyperlipemia 10/30/2014   Chronic obstructive pulmonary disease (HCC) 03/29/2014   Tobacco use disorder, continuous 03/29/2014   Asthma 06/05/2013   Shoulder joint pain 11/18/2012    PCP: Remo Lipps, PA  REFERRING PROVIDER: Remo Lipps, PA  REFERRING DIAG: [phone] (ICD-10-CM) - Pain in joint of left shoulder M54.50,G89.29 (ICD-10-CM) - Chronic bilateral low back pain without sciatica M15.9 (ICD-10-CM) - Primary osteoarthritis involving multiple joints  RATIONALE FOR EVALUATION AND TREATMENT: Rehabilitation  THERAPY DIAG: Chronic left-sided low back pain without sciatica  Chronic left shoulder pain  Muscle weakness (generalized)  ONSET DATE: 03/2015 (approximate);  FOLLOW-UP APPT SCHEDULED WITH REFERRING PROVIDER: Yes, 04/16/23;   SUBJECTIVE:  SUBJECTIVE STATEMENT:  Chronic low back pain.   PERTINENT HISTORY:  Pt referred for chronic L sided low back pain. She reports that the pain is from arthritis in her back and occasionally refers into her left hip. Pain for started 9 to 10 years ago. She denies any traumatic onset or back surgeries. She has tried injections but was allergic to the dye. She has also tried physical therapy in the past with some benefit. Most recent lumbar MRI was 06/2015 and showed lower thoracic degenerative disc disease, with advanced changes at T12-L1. L3-4, L4-5, and L5-S1 degenerative disc disease. Mild left neural foraminal narrowing at L3-4 and L5-S1. She has a history of R shoulder replacement. The symptoms are aggravated by bending and twisting. She has tried Celebrex without benefit. She was prescribed diclofenac but was concerned about possible GIB so never filled prescription. No history of L shoulder surgery but complains of regular left shoulder pain. History of comorbid insomnia and depression. She has an appointment to follow-up with PCP for CPE in the coming months.  03/19/23 CLINICAL DATA:  Chronic osteoarthritis.  Left shoulder pain.    EXAM: LEFT SHOULDER - 2+ VIEW   COMPARISON:  None available   FINDINGS: There is diffuse decreased bone mineralization. Mild glenohumeral joint space narrowing, subchondral sclerosis, and peripheral glenoid degenerative osteophytosis. Moderate-to-large inferior humeral head-neck junction degenerative osteophytes. There are approximate 17 mm and 16 mm ossicles overlying the axillary pouch just medial to the proximal humeral surgical neck, loose bodies.   Mild acromioclavicular joint space narrowing without significant peripheral osteophytosis.   No acute fracture or dislocation.   IMPRESSION: 1. 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 2. Two dominant loose bodies within the axillary pouch.  CLINICAL DATA:  Chronic lower back pain.   EXAM: LUMBAR SPINE - COMPLETE 4+ VIEW   COMPARISON:  None Available.   FINDINGS: There are 5 non-rib-bearing lumbar-type vertebral bodies. Minimal dextrocurvature centered at L2-3. Mild left L2-3 disc space narrowing.   Moderate anterior T12-L1 disc space narrowing and anterior endplate osteophytes. Mild anterior T11-12 disc space narrowing with moderate anterior endplate osteophytes. Minimal posterior L5-S1 disc space narrowing.   Vertebral body heights are maintained.   No pars defect is identified.   Moderate atherosclerotic calcifications.   IMPRESSION: Mild L2-3 and L5-S1 degenerative disc and endplate changes.   Moderate anterior T12-L1 degenerative disc and endplate changes.   PAIN:    Pain Intensity: Present: 7/10, Best: 4/10, Worst: 8/10 Pain location: bilateral lower lumbar spine (R side is worse) Pain Quality: throbbing,  Radiating: Yes, bilateral buttocks Numbness/Tingling: No Focal Weakness: No Aggravating factors: bending, twisting, lifting, extended standing, cooking, washing dishes, extended sitting; Easing factors: Lidocaine patches, hot shower, dry needling, home TENS; 24-hour pain behavior: increased  pain when first waking but pain stays intense throughout the day; History of prior back injury, pain, surgery, or therapy: Yes Dominant hand: right Imaging: Yes, plain films and MRI (see history);  Red flags: Positive: worsening urinary incontinence; Negative for saddle paresthesia, personal history of cancer, h/o spinal tumors, h/o compression fx, h/o abdominal aneurysm, abdominal pain, chills/fever, night sweats, nausea, vomiting, unrelenting pain, first onset of insidious LBP <20 y/o  PRECAUTIONS: None  WEIGHT BEARING RESTRICTIONS: No  FALLS: Has patient fallen in last 6 months? No  Living Environment Lives with:  Lives with a friend Lives in: House/apartment Stairs: 2 steps to enter, 1 step inside;  Prior level of function: Independent  Occupational demands: On disability  Hobbies: Walk dog, play on phone, watch TV  Patient Goals: reduce/eliminate pain, be more active   OBJECTIVE:  Patient Surveys  Modified Oswestry:  FOTO: 38, predicted improvement to 52;   Cognition Patient is oriented to person, place, and time.  Recent memory is intact.  Remote memory is intact.  Attention span and concentration are intact.  Expressive speech is intact.  Patient's fund of knowledge is within normal limits for educational level.    Gross Musculoskeletal Assessment Tremor: None Bulk: Normal Tone: Normal No visible step-off along spinal column, no signs of scoliosis  GAIT: Decreased self-selected gait speed. Gait appears antalgic;  Posture: Lumbar lordosis: Decreased normal lordosis Iliac crest height: Equal bilaterally Lumbar lateral shift: Negative  AROM AROM (Normal range in degrees) AROM   Lumbar   Flexion (65) Mild loss*  Extension (30) Mod loss*  Right lateral flexion (25) Mod loss*  Left lateral flexion (25) Severe loss*  Right rotation (30) Mild loss  Left rotation (30) Mild loss  (* = pain; Blank rows = not tested)  LE MMT: MMT (out of 5) Right  Left    Hip flexion 4+* 4+*  Hip extension    Hip abduction (seated) 5 5  Hip adduction (seated) 5 5  Hip internal rotation    Hip external rotation    Knee flexion 5 5  Knee extension 5 5  Ankle dorsiflexion 5 5  Ankle plantarflexion Strong Strong  (* = pain; Blank rows = not tested)  Sensation Grossly intact to light touch throughout bilateral LEs as determined by testing dermatomes L2-S2. Proprioception, stereognosis, and hot/cold testing deferred on this date.  Reflexes Deferred  Muscle Length Hamstrings: R: Not examined L: Not examined Ely (quadriceps): R: Not examined L: Not examined Thomas (hip flexors): R: Not examined L: Not examined Ober: R: Not examined L: Not examined  Palpation Location Right Left         Lumbar paraspinals 1 1  Quadratus Lumborum 1 1  Gluteus Maximus    Gluteus Medius    Deep hip external rotators    PSIS    Fortin's Area (SIJ)    Greater Trochanter    (Blank rows = not tested) Graded on 0-4 scale (0 = no pain, 1 = pain, 2 = pain with wincing/grimacing/flinching, 3 = pain with withdrawal, 4 = unwilling to allow palpation)  Passive Accessory Intervertebral Motion Deferred  Special Tests Lumbar Radiculopathy and Discogenic: Centralization and Peripheralization (SN 92, -LR 0.12): Not examined Slump (SN 83, -LR 0.32): R: Not examined L: Not examined SLR (SN 92, -LR 0.29): R: Not examined L:  Not examined Crossed SLR (SP 90): R: Not examined L: Not examined  Facet Joint: Extension-Rotation (SN 100, -LR 0.0): R: Not examined L: Not examined  Lumbar Foraminal Stenosis: Lumbar quadrant (SN 70): R: Not examined L: Not examined  Hip: FABER (SN 81): R: Not examined L: Not examined FADIR (SN 94): R: Not examined L: Not examined Hip scour (SN 50): R: Not examined L: Not examined  SIJ:  Thigh Thrust (SN 88, -LR 0.18) : R: Not examined L: Not examined  Piriformis Syndrome: FAIR Test (SN 88, SP 83): R: Not examined L: Not examined  Functional  Tasks Deferred  Beighton scale: Deferred   TODAY'S TREATMENT:   Electrical Stimulation  Using Empi Continuum unit applied high rate TENS to lumbar spine in two channel cross pattern with default low back parameters at pt tolerated intensity without muscle contraction x 20 minutes with concurrent moist heat pack applied to low back.  PATIENT EDUCATION:  Education details: Plan of care Person educated: Patient Education method: Explanation Education comprehension: verbalized understanding   HOME EXERCISE PROGRAM:  None currently   ASSESSMENT:  CLINICAL IMPRESSION: Patient is a 62 y.o. female who was seen today for physical therapy evaluation and treatment for chronic low back and L shoulder pain. Examination is somewhat limited today due to highly irritable pain.  OBJECTIVE IMPAIRMENTS: Abnormal gait, decreased activity tolerance, decreased ROM, decreased strength, obesity, and pain.   ACTIVITY LIMITATIONS: lifting, bending, sitting, standing, and squatting  PARTICIPATION LIMITATIONS: meal prep, cleaning, laundry, driving, shopping, and community activity  PERSONAL FACTORS: Behavior pattern, Past/current experiences, Time since onset of injury/illness/exacerbation, and 3+ comorbidities: GAD, depression, BPPV, OA, obesity, and insomnia  are also affecting patient's functional outcome.   REHAB POTENTIAL: Fair    CLINICAL DECISION MAKING: Unstable/unpredictable  EVALUATION COMPLEXITY: High   GOALS: Goals reviewed with patient? No  SHORT TERM GOALS: Target date: 05/06/2023  Pt will be independent with HEP in order to improve strength and decrease back pain to improve pain-free function at home and work. Baseline:  Goal status: INITIAL   LONG TERM GOALS: Target date: 06/03/2023  Pt will increase FOTO to at least 45 to demonstrate significant improvement in function at home and work related to back pain  Baseline: 04/08/23: 38; Goal status: INITIAL  2.  Pt will  decrease worst back pain by at least 2 points on the NPRS in order to demonstrate clinically significant reduction in back pain. Baseline: 04/08/23: worst: 8/10; Goal status: INITIAL  3.  Pt will decrease mODI score by at least 13 points in order demonstrate clinically significant reduction in back pain/disability.       Baseline: 04/08/23: To be completed Goal status: INITIAL   PLAN: PT FREQUENCY: 1-2x/week  PT DURATION: 8 weeks  PLANNED INTERVENTIONS: Therapeutic exercises, Therapeutic activity, Neuromuscular re-education, Balance training, Gait training, Patient/Family education, Self Care, Joint mobilization, Joint manipulation, Vestibular training, Canalith repositioning, Orthotic/Fit training, DME instructions, Dry Needling, Electrical stimulation, Spinal manipulation, Spinal mobilization, Cryotherapy, Moist heat, Taping, Traction, Ultrasound, Ionotophoresis 4mg /ml Dexamethasone, Manual therapy, and Re-evaluation.  PLAN FOR NEXT SESSION: neural tension testing, PAIVM testing, manual techniques and TENS for pain control, progressive lumbar stabilization program;   Sharalyn Ink Aleksey Newbern PT, DPT, GCS  Alberta Cairns, PT 04/11/2023, 7:20 PM

## 2023-04-09 ENCOUNTER — Ambulatory Visit: Payer: 59

## 2023-04-11 NOTE — Addendum Note (Signed)
Addended by: Ria Comment D on: 04/11/2023 07:32 PM   Modules accepted: Orders

## 2023-04-13 ENCOUNTER — Ambulatory Visit
Admission: RE | Admit: 2023-04-13 | Discharge: 2023-04-13 | Disposition: A | Discharge status: Home / Self Care | Payer: 59 | Source: Ambulatory Visit | Attending: Physician Assistant | Admitting: Physician Assistant

## 2023-04-13 DIAGNOSIS — M810 Age-related osteoporosis without current pathological fracture: Secondary | ICD-10-CM | POA: Diagnosis not present

## 2023-04-13 DIAGNOSIS — J4489 Other specified chronic obstructive pulmonary disease: Secondary | ICD-10-CM | POA: Diagnosis not present

## 2023-04-13 DIAGNOSIS — Z780 Asymptomatic menopausal state: Secondary | ICD-10-CM | POA: Diagnosis not present

## 2023-04-13 DIAGNOSIS — F17200 Nicotine dependence, unspecified, uncomplicated: Secondary | ICD-10-CM | POA: Insufficient documentation

## 2023-04-13 DIAGNOSIS — M8580 Other specified disorders of bone density and structure, unspecified site: Secondary | ICD-10-CM | POA: Insufficient documentation

## 2023-04-13 DIAGNOSIS — Z13820 Encounter for screening for osteoporosis: Secondary | ICD-10-CM | POA: Insufficient documentation

## 2023-04-13 DIAGNOSIS — E559 Vitamin D deficiency, unspecified: Secondary | ICD-10-CM | POA: Insufficient documentation

## 2023-04-14 ENCOUNTER — Telehealth: Payer: Self-pay

## 2023-04-14 ENCOUNTER — Ambulatory Visit (INDEPENDENT_AMBULATORY_CARE_PROVIDER_SITE_OTHER): Payer: 59 | Admitting: Physician Assistant

## 2023-04-14 ENCOUNTER — Encounter: Payer: Self-pay | Admitting: Physician Assistant

## 2023-04-14 VITALS — BP 118/80 | HR 87 | Temp 98.1°F | Ht 66.0 in | Wt 235.0 lb

## 2023-04-14 DIAGNOSIS — Z23 Encounter for immunization: Secondary | ICD-10-CM | POA: Diagnosis not present

## 2023-04-14 DIAGNOSIS — F331 Major depressive disorder, recurrent, moderate: Secondary | ICD-10-CM | POA: Diagnosis not present

## 2023-04-14 DIAGNOSIS — M159 Polyosteoarthritis, unspecified: Secondary | ICD-10-CM

## 2023-04-14 DIAGNOSIS — J4540 Moderate persistent asthma, uncomplicated: Secondary | ICD-10-CM

## 2023-04-14 DIAGNOSIS — F17209 Nicotine dependence, unspecified, with unspecified nicotine-induced disorders: Secondary | ICD-10-CM | POA: Diagnosis not present

## 2023-04-14 DIAGNOSIS — M810 Age-related osteoporosis without current pathological fracture: Secondary | ICD-10-CM | POA: Diagnosis not present

## 2023-04-14 DIAGNOSIS — M545 Low back pain, unspecified: Secondary | ICD-10-CM

## 2023-04-14 DIAGNOSIS — G8929 Other chronic pain: Secondary | ICD-10-CM

## 2023-04-14 MED ORDER — AIRSUPRA 90-80 MCG/ACT IN AERO
2.0000 | INHALATION_SPRAY | RESPIRATORY_TRACT | 5 refills | Status: DC | PRN
Start: 2023-04-14 — End: 2023-06-29

## 2023-04-14 MED ORDER — ALENDRONATE SODIUM 70 MG PO TABS
70.0000 mg | ORAL_TABLET | ORAL | 11 refills | Status: DC
Start: 2023-04-14 — End: 2024-03-20

## 2023-04-14 MED ORDER — AUVELITY 45-105 MG PO TBCR
1.0000 | EXTENDED_RELEASE_TABLET | Freq: Two times a day (BID) | ORAL | 2 refills | Status: DC
Start: 2023-04-14 — End: 2023-06-29

## 2023-04-14 NOTE — Progress Notes (Signed)
Date:  04/14/2023   Name:  Kelsey Rose   DOB:  [birth year]   MRN:  [id number]   Chief Complaint: Depression  HPI Donyae returns for 1 month f/u on chronic conditions, namely depression, smoking, and chronic back pain. She did not try the hydroxyzine prescribed last visit. Mirtazapine causing weight gain but she wants to continue taking it because it does such a nice job with sleep. Depression is poorly controlled with citalopram and mirtazapine, pain certainly contributing. Used bupropion in distant past.   Working on smoking cessation, now down to 0.25 ppd. Plans to quit after dental procedure in 2 days (pulling teeth for dentures).  Patient feels like her chronic pain is significantly impacting her daily functional ability. Recently restarted PT, says this only helps temporarily. Celebrex not helpful so we went back to diclofenac, but patient forgot I prescribed this for her so she has been taking nothing for pain.   DEXA result yesterday with osteoporosis as expected. Recent Ca and Vit D levels in normal range, not currently supplementing. States she took calcium supplement once and it caused significant GI distress.   Happened to mention an isolated incident of bowel incontinence in the early hours of this morning while sleeping - this has never happened to her. Says she was covered, bed was stained. No blood. Colonoscopy scheduled for Aug.    Medication list has been reviewed and updated.  Current Meds  Medication Sig   albuterol (ACCUNEB) 1.25 MG/3ML nebulizer solution Take 1 ampule by nebulization every 6 (six) hours as needed for wheezing.   albuterol (PROVENTIL HFA;VENTOLIN HFA) 108 (90 Base) MCG/ACT inhaler Inhale 2 puffs into the lungs every 6 (six) hours as needed for wheezing or shortness of breath.   Albuterol-Budesonide (AIRSUPRA) 90-80 MCG/ACT AERO Inhale 2 puffs into the lungs every 4 (four) hours as needed (for cough/wheezing).   alendronate (FOSAMAX) 70 MG tablet  Take 1 tablet (70 mg total) by mouth every 7 (seven) days. Take with a full glass of water on an empty stomach.   atorvastatin (LIPITOR) 20 MG tablet Take 1 tablet (20 mg total) by mouth daily.   bismuth subsalicylate (PEPTO BISMOL) 262 MG chewable tablet Chew 524 mg by mouth as needed.   citalopram (CELEXA) 40 MG tablet Take 40 mg by mouth daily.   Dextromethorphan-buPROPion ER (AUVELITY) 45-105 MG TBCR Take 1 tablet by mouth in the morning and at bedtime. Start with just 1 tab each morning, then increase to every 12h after 3 days   lidocaine (LIDODERM) 5 % Place 1 patch onto the skin daily. Remove & Discard patch within 12 hours or as directed by MD   loperamide (IMODIUM A-D) 2 MG tablet Take 2 mg by mouth 4 (four) times daily as needed for diarrhea or loose stools.   nystatin powder Apply 1 Application topically 3 (three) times daily. PRN   ondansetron (ZOFRAN) 4 MG tablet Take 4 mg by mouth every 8 (eight) hours as needed for nausea or vomiting.   Tiotropium Bromide-Olodaterol (STIOLTO RESPIMAT) 2.5-2.5 MCG/ACT AERS Inhale 2 each into the lungs daily.   [DISCONTINUED] cetirizine (ZYRTEC) 5 MG chewable tablet Chew 5 mg by mouth daily.   [DISCONTINUED] diclofenac (VOLTAREN) 50 MG EC tablet Take 1 tablet (50 mg total) by mouth 2 (two) times daily.     Review of Systems  Constitutional:  Positive for fatigue. Negative for fever.  Respiratory:  Negative for chest tightness and shortness of breath.   Cardiovascular:  Negative  for chest pain and palpitations.  Gastrointestinal:  Negative for abdominal pain.       Bowel incontinence, one episode  Musculoskeletal:  Positive for arthralgias and back pain.  Psychiatric/Behavioral:  Positive for dysphoric mood and sleep disturbance.     Patient Active Problem List   Diagnosis Date Noted   Aortic atherosclerosis (HCC) 03/25/2023   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 03/25/2023   Osteoporosis 03/25/2023   Generalized anxiety disorder 02/19/2023    Insomnia due to other mental disorder 02/19/2023   Benign paroxysmal positional vertigo 04/28/2022   Chronic bilateral low back pain without sciatica 04/28/2022   Moderate recurrent major depression (HCC) 04/28/2022   History of vitamin D deficiency 04/28/2022   Lumbar radiculopathy 04/07/2022   Chronic diarrhea 02/16/2022   Class 2 severe obesity with serious comorbidity and body mass index (BMI) of 37.0 to 37.9 in adult (HCC) 08/22/2015   Lumbar degenerative disc disease 08/22/2015   GERD (gastroesophageal reflux disease) 04/25/2015   Osteoarthritis, multiple sites 12/20/2014   Hyperlipemia 10/30/2014   Chronic obstructive pulmonary disease (HCC) 03/29/2014   Tobacco use disorder, continuous 03/29/2014   Asthma 06/05/2013   Shoulder joint pain 11/18/2012    Allergies  Allergen Reactions   Sulfa Antibiotics Swelling    Developed swelling in eyes when using Sulfa eye drops.   Iodinated Contrast Media Nausea Only   Penicillins Nausea And Vomiting    Got really sick, throwing up as child    Immunization History  Administered Date(s) Administered   Influenza-Unspecified 10/22/2015   Pneumococcal Polysaccharide-23 08/30/2014    Past Surgical History:  Procedure Laterality Date   ABDOMINAL HYSTERECTOMY  62 yrs old   JOINT REPLACEMENT Right  May 2015   Shoulder replacement   TONSILLECTOMY AND ADENOIDECTOMY  Age 59    Social History   Tobacco Use   Smoking status: Every Day    Packs/day: 1.00    Years: 53.00    Additional pack years: 0.00    Total pack years: 53.00    Types: Cigarettes  Vaping Use   Vaping Use: Never used  Substance Use Topics   Alcohol use: No   Drug use: Not Currently    Family History  Problem Relation Age of Onset   Cirrhosis Father    Depression Sister    COPD Sister    Diabetes Daughter    Depression Sister    COPD Sister    Gallstones Mother    Dementia Mother    Osteoarthritis Mother    Heart attack Brother         04/14/2023     1:29 PM 03/19/2023    2:14 PM 02/19/2023    1:17 PM 01/19/2023    3:07 PM  GAD 7 : Generalized Anxiety Score  Nervous, Anxious, on Edge 0 2 2 3   Control/stop worrying 2 3 3 3   Worry too much - different things 2 3 3 3   Trouble relaxing 2 2 2  0  Restless 1 2 2 2   Easily annoyed or irritable 3 3 3 3   Afraid - awful might happen 0 1 1 0  Total GAD 7 Score 10 16 16 14   Anxiety Difficulty Somewhat difficult Not difficult at all Not difficult at all Not difficult at all       04/14/2023    1:29 PM 03/19/2023    2:14 PM 03/03/2023    1:04 PM  Depression screen PHQ 2/9  Decreased Interest 1 0 0  Down, Depressed, Hopeless 3 1 1  PHQ - 2 Score 4 1 1   Altered sleeping 0 1 1  Tired, decreased energy 3 1 1   Change in appetite 2 0 0  Feeling bad or failure about yourself  0 0 0  Trouble concentrating 2 1 1   Moving slowly or fidgety/restless 0 0 0  Suicidal thoughts 0 0 0  PHQ-9 Score 11 4 4   Difficult doing work/chores Somewhat difficult Not difficult at all Somewhat difficult    BP Readings from Last 3 Encounters:  04/14/23 118/80  03/19/23 104/78  02/19/23 128/78    Wt Readings from Last 3 Encounters:  04/14/23 235 lb (106.6 kg)  03/19/23 237 lb (107.5 kg)  03/03/23 234 lb (106.1 kg)    BP 118/80   Pulse 87   Temp 98.1 F (36.7 C) (Oral)   Ht 5\' 6"  (1.676 m)   Wt 235 lb (106.6 kg)   SpO2 95%   BMI 37.93 kg/m   Physical Exam Vitals and nursing note reviewed.  Constitutional:      Appearance: Normal appearance.  Cardiovascular:     Rate and Rhythm: Normal rate and regular rhythm.     Heart sounds: No murmur heard.    No friction rub. No gallop.  Pulmonary:     Effort: Pulmonary effort is normal.     Breath sounds: Wheezing present.  Abdominal:     General: There is no distension.  Musculoskeletal:        General: Normal range of motion.  Skin:    General: Skin is warm and dry.  Neurological:     Mental Status: She is alert and oriented to person, place, and  time.     Gait: Gait is intact.  Psychiatric:        Mood and Affect: Affect normal. Mood is depressed.     Recent Labs     Component Value Date/Time   NA 139 01/19/2023 1608   K 5.0 01/19/2023 1608   CL 99 01/19/2023 1608   CO2 24 01/19/2023 1608   GLUCOSE 89 01/19/2023 1608   BUN 11 01/19/2023 1608   CREATININE 0.84 01/19/2023 1608   CALCIUM 9.8 01/19/2023 1608   PROT 7.0 01/19/2023 1608   ALBUMIN 4.2 01/19/2023 1608   AST 15 01/19/2023 1608   ALT 13 01/19/2023 1608   ALKPHOS 116 01/19/2023 1608   BILITOT <0.2 01/19/2023 1608    Lab Results  Component Value Date   WBC 7.2 01/19/2023   HGB 15.3 01/19/2023   HCT 44.5 01/19/2023   MCV 91 01/19/2023   PLT 234 01/19/2023   Lab Results  Component Value Date   HGBA1C 5.5 12/20/2014   Lab Results  Component Value Date   CHOL 268 (H) 01/19/2023   HDL 48 01/19/2023   LDLCALC 168 (H) 01/19/2023   TRIG 278 (H) 01/19/2023   CHOLHDL 5.6 (H) 01/19/2023   Lab Results  Component Value Date   TSH 1.810 01/19/2023     Assessment and Plan:  1. Moderate recurrent major depression (HCC) Will try Auvelity.  Sent to [facility], patient given prescription savings card. Patient might get additional benefit from the bupropion component as she attempts at smoking cessation.  Continue citalopram and mirtazapine for now. - Dextromethorphan-buPROPion ER (AUVELITY) 45-105 MG TBCR; Take 1 tablet by mouth in the morning and at bedtime. Start with just 1 tab each morning, then increase to every 12h after 3 days  Dispense: 60 tablet; Refill: 2  2. Tobacco use disorder, continuous Congratulated on progress  so far, encouraged complete cessation, patient in agreement.  3. Age-related osteoporosis without current pathological fracture Begin alendronate once weekly to reduce risk of future osteoporotic fracture.  Encouraged adequate calcium and vitamin D intake; despite normal labs for both of these values within the last few months, I did  encourage some type of multivitamin or other bone supplement containing both vitamin D and calcium, as long as this is tolerated by patient. - alendronate (FOSAMAX) 70 MG tablet; Take 1 tablet (70 mg total) by mouth every 7 (seven) days. Take with a full glass of water on an empty stomach.  Dispense: 4 tablet; Refill: 11  4. Chronic bilateral low back pain without sciatica Overlap with depression and insomnia.  Referring to pain clinic for additional management.  Reminded to take diclofenac which should at least take the edge off of her pain - she has not been taking recently... - Ambulatory referral to Pain Clinic  5. Primary osteoarthritis involving multiple joints Plan as above - Ambulatory referral to Pain Clinic  6. Need for Tdap vaccination Tdap booster administered today - Tdap vaccine greater than or equal to 7yo IM  7. Moderate persistent asthma without complication Switch albuterol to Airsupra for continued management of asthma with COPD overlap.  Sample of 1 inhaler given today along with prescription savings card - Albuterol-Budesonide (AIRSUPRA) 90-80 MCG/ACT AERO; Inhale 2 puffs into the lungs every 4 (four) hours as needed (for cough/wheezing).  Dispense: 5.9 g; Refill: 5     Return in about 6 weeks (around 05/26/2023) for OV chronic conditions.  Bring all meds, vitamins, and supplements next visit for reconciliation.  Partially dictated using Animal nutritionist. Any errors are unintentional.  Alvester Morin, PA-C, DMSc, Nutritionist [facility] [phone]

## 2023-04-14 NOTE — Patient Instructions (Signed)
Come back in 6 weeks and BRING YOUR MEDICINES! See you then!

## 2023-04-14 NOTE — Telephone Encounter (Signed)
PA completed waiting on insurance approval.  Key: [id number]  KP

## 2023-04-15 NOTE — Telephone Encounter (Signed)
Denied  KP 

## 2023-04-16 ENCOUNTER — Ambulatory Visit: Payer: 59 | Admitting: Physician Assistant

## 2023-04-16 NOTE — Telephone Encounter (Signed)
Call patient, let her know insurance denied the new inhaler but she may still be able to get it with the manufacturer savings card I gave her. I'd recommend she follow the instructions on the pamphlet to see if they can help her out.

## 2023-04-16 NOTE — Telephone Encounter (Signed)
Pt called and verbalized understanding.  KP

## 2023-04-20 ENCOUNTER — Ambulatory Visit: Payer: 59

## 2023-04-23 ENCOUNTER — Ambulatory Visit: Payer: 59 | Attending: Physician Assistant

## 2023-04-23 DIAGNOSIS — R2689 Other abnormalities of gait and mobility: Secondary | ICD-10-CM | POA: Insufficient documentation

## 2023-04-23 DIAGNOSIS — R42 Dizziness and giddiness: Secondary | ICD-10-CM | POA: Insufficient documentation

## 2023-04-23 DIAGNOSIS — M545 Low back pain, unspecified: Secondary | ICD-10-CM | POA: Insufficient documentation

## 2023-04-23 DIAGNOSIS — M6281 Muscle weakness (generalized): Secondary | ICD-10-CM | POA: Insufficient documentation

## 2023-04-23 DIAGNOSIS — M25512 Pain in left shoulder: Secondary | ICD-10-CM | POA: Insufficient documentation

## 2023-04-23 DIAGNOSIS — G8929 Other chronic pain: Secondary | ICD-10-CM | POA: Insufficient documentation

## 2023-04-27 ENCOUNTER — Ambulatory Visit: Payer: 59

## 2023-04-27 DIAGNOSIS — R42 Dizziness and giddiness: Secondary | ICD-10-CM | POA: Diagnosis present

## 2023-04-27 DIAGNOSIS — G8929 Other chronic pain: Secondary | ICD-10-CM

## 2023-04-27 DIAGNOSIS — R2689 Other abnormalities of gait and mobility: Secondary | ICD-10-CM | POA: Diagnosis present

## 2023-04-27 DIAGNOSIS — M545 Low back pain, unspecified: Secondary | ICD-10-CM | POA: Diagnosis present

## 2023-04-27 DIAGNOSIS — M6281 Muscle weakness (generalized): Secondary | ICD-10-CM

## 2023-04-27 DIAGNOSIS — M25512 Pain in left shoulder: Secondary | ICD-10-CM | POA: Diagnosis present

## 2023-04-27 NOTE — Therapy (Signed)
OUTPATIENT PHYSICAL THERAPY THORACOLUMBAR TREATMENT  Patient Name: Kelsey Rose MRN: [id number] DOB:[birth year], 62 y.o., female Today's Date: 04/28/2023  END OF SESSION:  PT End of Session - 04/28/23 2201     Visit Number 2    Number of Visits 17    Date for PT Re-Evaluation 06/03/23    Authorization Type eval: 04/08/23    PT Start Time 1145    PT Stop Time 1230    PT Time Calculation (min) 45 min    Activity Tolerance Patient tolerated treatment well    Behavior During Therapy WFL for tasks assessed/performed            Past Medical History:  Diagnosis Date   Anxiety    Arthritis    Asthma    COPD (chronic obstructive pulmonary disease) (HCC)    Depression    GERD (gastroesophageal reflux disease)    Past Surgical History:  Procedure Laterality Date   ABDOMINAL HYSTERECTOMY  62 yrs old   JOINT REPLACEMENT Right  May 2015   Shoulder replacement   TONSILLECTOMY AND ADENOIDECTOMY  Age 67   Patient Active Problem List   Diagnosis Date Noted   Aortic atherosclerosis (HCC) 03/25/2023   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 03/25/2023   Osteoporosis 03/25/2023   Generalized anxiety disorder 02/19/2023   Insomnia due to other mental disorder 02/19/2023   Benign paroxysmal positional vertigo 04/28/2022   Chronic bilateral low back pain without sciatica 04/28/2022   Moderate recurrent major depression (HCC) 04/28/2022   History of vitamin D deficiency 04/28/2022   Lumbar radiculopathy 04/07/2022   Chronic diarrhea 02/16/2022   Class 2 severe obesity with serious comorbidity and body mass index (BMI) of 37.0 to 37.9 in adult (HCC) 08/22/2015   Lumbar degenerative disc disease 08/22/2015   GERD (gastroesophageal reflux disease) 04/25/2015   Osteoarthritis, multiple sites 12/20/2014   Hyperlipemia 10/30/2014   Chronic obstructive pulmonary disease (HCC) 03/29/2014   Tobacco use disorder, continuous 03/29/2014   Asthma 06/05/2013   Shoulder joint pain 11/18/2012   PCP:  Remo Lipps, PA  REFERRING PROVIDER: Remo Lipps, PA  REFERRING DIAG: [phone] (ICD-10-CM) - Pain in joint of left shoulder M54.50,G89.29 (ICD-10-CM) - Chronic bilateral low back pain without sciatica M15.9 (ICD-10-CM) - Primary osteoarthritis involving multiple joints  RATIONALE FOR EVALUATION AND TREATMENT: Rehabilitation  THERAPY DIAG: Chronic left-sided low back pain without sciatica  Chronic left shoulder pain  Muscle weakness (generalized)  ONSET DATE: 03/2015 (approximate);  FOLLOW-UP APPT SCHEDULED WITH REFERRING PROVIDER: Yes, 04/16/23;  FROM INITIAL EVALUATION SUBJECTIVE:  SUBJECTIVE STATEMENT:  Chronic low back pain.   PERTINENT HISTORY:  Pt referred for chronic L sided low back pain. She reports that the pain is from arthritis in her back and occasionally refers into her left hip. Pain for started 9 to 10 years ago. She denies any traumatic onset or back surgeries. She has tried injections but was allergic to the dye. She has also tried physical therapy in the past with some benefit. Most recent lumbar MRI was 06/2015 and showed lower thoracic degenerative disc disease, with advanced changes at T12-L1. L3-4, L4-5, and L5-S1 degenerative disc disease. Mild left neural foraminal narrowing at L3-4 and L5-S1. She has a history of R shoulder replacement. The symptoms are aggravated by bending and twisting. She has tried Celebrex without benefit. She was prescribed diclofenac but was concerned about possible GIB so never filled prescription. No history of L shoulder surgery but complains of regular left shoulder pain. History of comorbid insomnia and depression. She has an appointment to follow-up with PCP for CPE in the coming months.  03/19/23 CLINICAL DATA:  Chronic osteoarthritis.  Left  shoulder pain.   EXAM: LEFT SHOULDER - 2+ VIEW   COMPARISON:  None available   FINDINGS: There is diffuse decreased bone mineralization. Mild glenohumeral joint space narrowing, subchondral sclerosis, and peripheral glenoid degenerative osteophytosis. Moderate-to-large inferior humeral head-neck junction degenerative osteophytes. There are approximate 17 mm and 16 mm ossicles overlying the axillary pouch just medial to the proximal humeral surgical neck, loose bodies.   Mild acromioclavicular joint space narrowing without significant peripheral osteophytosis.   No acute fracture or dislocation.   IMPRESSION: 1. 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 2. Two dominant loose bodies within the axillary pouch.  CLINICAL DATA:  Chronic lower back pain.   EXAM: LUMBAR SPINE - COMPLETE 4+ VIEW   COMPARISON:  None Available.   FINDINGS: There are 5 non-rib-bearing lumbar-type vertebral bodies. Minimal dextrocurvature centered at L2-3. Mild left L2-3 disc space narrowing.   Moderate anterior T12-L1 disc space narrowing and anterior endplate osteophytes. Mild anterior T11-12 disc space narrowing with moderate anterior endplate osteophytes. Minimal posterior L5-S1 disc space narrowing.   Vertebral body heights are maintained.   No pars defect is identified.   Moderate atherosclerotic calcifications.   IMPRESSION: Mild L2-3 and L5-S1 degenerative disc and endplate changes.   Moderate anterior T12-L1 degenerative disc and endplate changes.   PAIN:    Pain Intensity: Present: 7/10, Best: 4/10, Worst: 8/10 Pain location: bilateral lower lumbar spine (R side is worse) Pain Quality: throbbing,  Radiating: Yes, bilateral buttocks Numbness/Tingling: No Focal Weakness: No Aggravating factors: bending, twisting, lifting, extended standing, cooking, washing dishes, extended sitting; Easing factors: Lidocaine patches, hot shower, dry needling, home TENS; 24-hour pain  behavior: increased pain when first waking but pain stays intense throughout the day; History of prior back injury, pain, surgery, or therapy: Yes Dominant hand: right Imaging: Yes, plain films and MRI (see history);  Red flags: Positive: worsening urinary incontinence; Negative for saddle paresthesia, personal history of cancer, h/o spinal tumors, h/o compression fx, h/o abdominal aneurysm, abdominal pain, chills/fever, night sweats, nausea, vomiting, unrelenting pain, first onset of insidious LBP <20 y/o  PRECAUTIONS: None  WEIGHT BEARING RESTRICTIONS: No  FALLS: Has patient fallen in last 6 months? No  Living Environment Lives with:  Lives with a friend Lives in: House/apartment Stairs: 2 steps to enter, 1 step inside;  Prior level of function: Independent  Occupational demands: On disability  Hobbies: Walk dog, play on phone, watch TV  Patient Goals: reduce/eliminate pain, be more active   OBJECTIVE:  Patient Surveys  Modified Oswestry:  FOTO: 38, predicted improvement to 69;   Cognition Patient is oriented to person, place, and time.  Recent memory is intact.  Remote memory is intact.  Attention span and concentration are intact.  Expressive speech is intact.  Patient's fund of knowledge is within normal limits for educational level.    Gross Musculoskeletal Assessment Tremor: None Bulk: Normal Tone: Normal No visible step-off along spinal column, no signs of scoliosis  GAIT: Decreased self-selected gait speed. Gait appears antalgic;  Posture: Lumbar lordosis: Decreased normal lordosis Iliac crest height: Equal bilaterally Lumbar lateral shift: Negative  AROM AROM (Normal range in degrees) AROM   Lumbar   Flexion (65) Mild loss*  Extension (30) Mod loss*  Right lateral flexion (25) Mod loss*  Left lateral flexion (25) Severe loss*  Right rotation (30) Mild loss  Left rotation (30) Mild loss  (* = pain; Blank rows = not tested)  LE MMT: MMT (out  of 5) Right  Left   Hip flexion 4+* 4+*  Hip extension    Hip abduction (seated) 5 5  Hip adduction (seated) 5 5  Hip internal rotation    Hip external rotation    Knee flexion 5 5  Knee extension 5 5  Ankle dorsiflexion 5 5  Ankle plantarflexion Strong Strong  (* = pain; Blank rows = not tested)  Sensation Grossly intact to light touch throughout bilateral LEs as determined by testing dermatomes L2-S2. Proprioception, stereognosis, and hot/cold testing deferred on this date.  Reflexes Deferred  Muscle Length Hamstrings: R: Not examined L: Not examined Ely (quadriceps): R: Not examined L: Not examined Thomas (hip flexors): R: Not examined L: Not examined Ober: R: Not examined L: Not examined  Palpation Location Right Left         Lumbar paraspinals 1 1  Quadratus Lumborum 1 1  Gluteus Maximus    Gluteus Medius    Deep hip external rotators    PSIS    Fortin's Area (SIJ)    Greater Trochanter    (Blank rows = not tested) Graded on 0-4 scale (0 = no pain, 1 = pain, 2 = pain with wincing/grimacing/flinching, 3 = pain with withdrawal, 4 = unwilling to allow palpation)  Passive Accessory Intervertebral Motion Deferred  Special Tests Lumbar Radiculopathy and Discogenic: Centralization and Peripheralization (SN 92, -LR 0.12): Not examined Slump (SN 83, -LR 0.32): R: Not examined L: Not examined SLR (SN 92, -LR 0.29): R: Not examined L:  Not examined Crossed SLR (SP 90): R: Not examined L: Not examined  Facet Joint: Extension-Rotation (SN 100, -LR 0.0): R: Not examined L: Not examined  Lumbar Foraminal Stenosis: Lumbar quadrant (SN 70): R: Not examined L: Not examined  Hip: FABER (SN 81): R: Not examined L: Not examined FADIR (SN 94): R: Not examined L: Not examined Hip scour (SN 50): R: Not examined L: Not examined  SIJ:  Thigh Thrust (SN 88, -LR 0.18) : R: Not examined L: Not examined  Piriformis Syndrome: FAIR Test (SN 88, SP 83): R: Not examined L: Not  examined  Functional Tasks Deferred  Beighton scale: Deferred   TODAY'S TREATMENT:   SUBJECTIVE: Pt reports that she is doing alright today. She reports 4/10 left low back and left hip pain upon arrival. No specific questions or concerns currently.    PAIN: 4/10 left low back and left hip pain;   Manual Therapy  STM  to bilateral lumbar paraspinals and quadratus lumborum with effleurage x 8 minutes;   Ther-ex  NuStep L2 x 5 minutes for warm-up during interval history  Hooklying lumbar rotation rocking 2 x 60s each direction; Hooklying posterior pelvic tilts 3s hold x 10; Hooklying posterior pelvic tilt with alternating BLE marches 2 x 10 BLE; Hooklying SLR x 10 BLE; Hooklying clams with manual resistance from therapist 2 x 10 BLE; Hooklying adductor squeezes with manual resistance from therapist 2 x 10 BLE; Hooklying bridges x 10;   PATIENT EDUCATION:  Education details: Plan of care Person educated: Patient Education method: Explanation Education comprehension: verbalized understanding   HOME EXERCISE PROGRAM:  None currently   ASSESSMENT:  CLINICAL IMPRESSION: Initiated manual therapy and strengthening during session today. Pt denies any increase in pan. Pt encouraged to follow-up as scheduled. Plan to progress strengthening and utilize manual therapy for pain management. Pt will benefit from PT services to address deficits in strength, ROM, and pain in order to return to full function at home.   OBJECTIVE IMPAIRMENTS: Abnormal gait, decreased activity tolerance, decreased ROM, decreased strength, obesity, and pain.   ACTIVITY LIMITATIONS: lifting, bending, sitting, standing, and squatting  PARTICIPATION LIMITATIONS: meal prep, cleaning, laundry, driving, shopping, and community activity  PERSONAL FACTORS: Behavior pattern, Past/current experiences, Time since onset of injury/illness/exacerbation, and 3+ comorbidities: GAD, depression, BPPV, OA, obesity, and  insomnia  are also affecting patient's functional outcome.   REHAB POTENTIAL: Fair    CLINICAL DECISION MAKING: Unstable/unpredictable  EVALUATION COMPLEXITY: High   GOALS: Goals reviewed with patient? No  SHORT TERM GOALS: Target date: 05/06/2023  Pt will be independent with HEP in order to improve strength and decrease back pain to improve pain-free function at home and work. Baseline:  Goal status: INITIAL   LONG TERM GOALS: Target date: 06/03/2023  Pt will increase FOTO to at least 45 to demonstrate significant improvement in function at home and work related to back pain  Baseline: 04/08/23: 38; Goal status: INITIAL  2.  Pt will decrease worst back pain by at least 2 points on the NPRS in order to demonstrate clinically significant reduction in back pain. Baseline: 04/08/23: worst: 8/10; Goal status: INITIAL  3.  Pt will decrease mODI score by at least 13 points in order demonstrate clinically significant reduction in back pain/disability.       Baseline: 04/08/23: To be completed Goal status: INITIAL   PLAN: PT FREQUENCY: 1-2x/week  PT DURATION: 8 weeks  PLANNED INTERVENTIONS: Therapeutic exercises, Therapeutic activity, Neuromuscular re-education, Balance training, Gait training, Patient/Family education, Self Care, Joint mobilization, Joint manipulation, Vestibular training, Canalith repositioning, Orthotic/Fit training, DME instructions, Dry Needling, Electrical stimulation, Spinal manipulation, Spinal mobilization, Cryotherapy, Moist heat, Taping, Traction, Ultrasound, Ionotophoresis 4mg /ml Dexamethasone, Manual therapy, and Re-evaluation.  PLAN FOR NEXT SESSION: neural tension testing, PAIVM testing, manual techniques and TENS for pain control, progressive lumbar stabilization program;   Sharalyn Ink Aundrea Horace PT, DPT, GCS  Drucella Karbowski, PT 04/28/2023, 10:15 PM

## 2023-04-30 ENCOUNTER — Ambulatory Visit: Payer: 59

## 2023-05-04 ENCOUNTER — Ambulatory Visit: Payer: 59

## 2023-05-07 ENCOUNTER — Ambulatory Visit: Payer: 59

## 2023-05-07 DIAGNOSIS — R42 Dizziness and giddiness: Secondary | ICD-10-CM

## 2023-05-07 DIAGNOSIS — G8929 Other chronic pain: Secondary | ICD-10-CM

## 2023-05-07 DIAGNOSIS — M545 Low back pain, unspecified: Secondary | ICD-10-CM | POA: Diagnosis not present

## 2023-05-07 DIAGNOSIS — M6281 Muscle weakness (generalized): Secondary | ICD-10-CM

## 2023-05-07 DIAGNOSIS — R2689 Other abnormalities of gait and mobility: Secondary | ICD-10-CM

## 2023-05-07 NOTE — Therapy (Signed)
OUTPATIENT PHYSICAL THERAPY THORACOLUMBAR TREATMENT  Patient Name: Kelsey Rose MRN: [id number] DOB:30-Jan-1961, 62 y.o., female Today's Date: 05/07/2023  END OF SESSION:  PT End of Session - 05/07/23 1146     Visit Number 3    Number of Visits 17    Date for PT Re-Evaluation 06/03/23    PT Start Time 1057    PT Stop Time 1137    PT Time Calculation (min) 40 min    Activity Tolerance Patient tolerated treatment well    Behavior During Therapy WFL for tasks assessed/performed            Past Medical History:  Diagnosis Date   Anxiety    Arthritis    Asthma    COPD (chronic obstructive pulmonary disease) (HCC)    Depression    GERD (gastroesophageal reflux disease)    Past Surgical History:  Procedure Laterality Date   ABDOMINAL HYSTERECTOMY  61 yrs old   JOINT REPLACEMENT Right  May 2015   Shoulder replacement   TONSILLECTOMY AND ADENOIDECTOMY  Age 54   Patient Active Problem List   Diagnosis Date Noted   Aortic atherosclerosis (HCC) 03/25/2023   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 03/25/2023   Osteoporosis 03/25/2023   Generalized anxiety disorder 02/19/2023   Insomnia due to other mental disorder 02/19/2023   Benign paroxysmal positional vertigo 04/28/2022   Chronic bilateral low back pain without sciatica 04/28/2022   Moderate recurrent major depression (HCC) 04/28/2022   History of vitamin D deficiency 04/28/2022   Lumbar radiculopathy 04/07/2022   Chronic diarrhea 02/16/2022   Class 2 severe obesity with serious comorbidity and body mass index (BMI) of 37.0 to 37.9 in adult (HCC) 08/22/2015   Lumbar degenerative disc disease 08/22/2015   GERD (gastroesophageal reflux disease) 04/25/2015   Osteoarthritis, multiple sites 12/20/2014   Hyperlipemia 10/30/2014   Chronic obstructive pulmonary disease (HCC) 03/29/2014   Tobacco use disorder, continuous 03/29/2014   Asthma 06/05/2013   Shoulder joint pain 11/18/2012   PCP: Remo Lipps, PA  REFERRING  PROVIDER: Remo Lipps, PA  REFERRING DIAG: [phone] (ICD-10-CM) - Pain in joint of left shoulder M54.50,G89.29 (ICD-10-CM) - Chronic bilateral low back pain without sciatica M15.9 (ICD-10-CM) - Primary osteoarthritis involving multiple joints  RATIONALE FOR EVALUATION AND TREATMENT: Rehabilitation  THERAPY DIAG: Chronic left-sided low back pain without sciatica  Muscle weakness (generalized)  Dizziness and giddiness  Decreased mobility  ONSET DATE: 03/2015 (approximate);  FOLLOW-UP APPT SCHEDULED WITH REFERRING PROVIDER: Yes, 04/16/23;  FROM INITIAL EVALUATION SUBJECTIVE:  SUBJECTIVE STATEMENT:  Chronic low back pain.   PERTINENT HISTORY:  Pt referred for chronic L sided low back pain. She reports that the pain is from arthritis in her back and occasionally refers into her left hip. Pain for started 9 to 10 years ago. She denies any traumatic onset or back surgeries. She has tried injections but was allergic to the dye. She has also tried physical therapy in the past with some benefit. Most recent lumbar MRI was 06/2015 and showed lower thoracic degenerative disc disease, with advanced changes at T12-L1. L3-4, L4-5, and L5-S1 degenerative disc disease. Mild left neural foraminal narrowing at L3-4 and L5-S1. She has a history of R shoulder replacement. The symptoms are aggravated by bending and twisting. She has tried Celebrex without benefit. She was prescribed diclofenac but was concerned about possible GIB so never filled prescription. No history of L shoulder surgery but complains of regular left shoulder pain. History of comorbid insomnia and depression. She has an appointment to follow-up with PCP for CPE in the coming months.  03/19/23 CLINICAL DATA:  Chronic osteoarthritis.  Left shoulder pain.    EXAM: LEFT SHOULDER - 2+ VIEW   COMPARISON:  None available   FINDINGS: There is diffuse decreased bone mineralization. Mild glenohumeral joint space narrowing, subchondral sclerosis, and peripheral glenoid degenerative osteophytosis. Moderate-to-large inferior humeral head-neck junction degenerative osteophytes. There are approximate 17 mm and 16 mm ossicles overlying the axillary pouch just medial to the proximal humeral surgical neck, loose bodies.   Mild acromioclavicular joint space narrowing without significant peripheral osteophytosis.   No acute fracture or dislocation.   IMPRESSION: 1. 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 2. Two dominant loose bodies within the axillary pouch.  CLINICAL DATA:  Chronic lower back pain.   EXAM: LUMBAR SPINE - COMPLETE 4+ VIEW   COMPARISON:  None Available.   FINDINGS: There are 5 non-rib-bearing lumbar-type vertebral bodies. Minimal dextrocurvature centered at L2-3. Mild left L2-3 disc space narrowing.   Moderate anterior T12-L1 disc space narrowing and anterior endplate osteophytes. Mild anterior T11-12 disc space narrowing with moderate anterior endplate osteophytes. Minimal posterior L5-S1 disc space narrowing.   Vertebral body heights are maintained.   No pars defect is identified.   Moderate atherosclerotic calcifications.   IMPRESSION: Mild L2-3 and L5-S1 degenerative disc and endplate changes.   Moderate anterior T12-L1 degenerative disc and endplate changes.   PAIN:    Pain Intensity: Present: 7/10, Best: 4/10, Worst: 8/10 Pain location: bilateral lower lumbar spine (R side is worse) Pain Quality: throbbing,  Radiating: Yes, bilateral buttocks Numbness/Tingling: No Focal Weakness: No Aggravating factors: bending, twisting, lifting, extended standing, cooking, washing dishes, extended sitting; Easing factors: Lidocaine patches, hot shower, dry needling, home TENS; 24-hour pain behavior: increased  pain when first waking but pain stays intense throughout the day; History of prior back injury, pain, surgery, or therapy: Yes Dominant hand: right Imaging: Yes, plain films and MRI (see history);  Red flags: Positive: worsening urinary incontinence; Negative for saddle paresthesia, personal history of cancer, h/o spinal tumors, h/o compression fx, h/o abdominal aneurysm, abdominal pain, chills/fever, night sweats, nausea, vomiting, unrelenting pain, first onset of insidious LBP <20 y/o  PRECAUTIONS: None  WEIGHT BEARING RESTRICTIONS: No  FALLS: Has patient fallen in last 6 months? No  Living Environment Lives with:  Lives with a friend Lives in: House/apartment Stairs: 2 steps to enter, 1 step inside;  Prior level of function: Independent  Occupational demands: On disability  Hobbies: Walk dog, play on phone, watch TV  Patient Goals: reduce/eliminate pain, be more active   OBJECTIVE:  Patient Surveys  Modified Oswestry:  FOTO: 38, predicted improvement to 73;   Cognition Patient is oriented to person, place, and time.  Recent memory is intact.  Remote memory is intact.  Attention span and concentration are intact.  Expressive speech is intact.  Patient's fund of knowledge is within normal limits for educational level.    Gross Musculoskeletal Assessment Tremor: None Bulk: Normal Tone: Normal No visible step-off along spinal column, no signs of scoliosis  GAIT: Decreased self-selected gait speed. Gait appears antalgic;  Posture: Lumbar lordosis: Decreased normal lordosis Iliac crest height: Equal bilaterally Lumbar lateral shift: Negative  AROM AROM (Normal range in degrees) AROM   Lumbar   Flexion (65) Mild loss*  Extension (30) Mod loss*  Right lateral flexion (25) Mod loss*  Left lateral flexion (25) Severe loss*  Right rotation (30) Mild loss  Left rotation (30) Mild loss  (* = pain; Blank rows = not tested)  LE MMT: MMT (out of 5) Right  Left    Hip flexion 4+* 4+*  Hip extension    Hip abduction (seated) 5 5  Hip adduction (seated) 5 5  Hip internal rotation    Hip external rotation    Knee flexion 5 5  Knee extension 5 5  Ankle dorsiflexion 5 5  Ankle plantarflexion Strong Strong  (* = pain; Blank rows = not tested)  Sensation Grossly intact to light touch throughout bilateral LEs as determined by testing dermatomes L2-S2. Proprioception, stereognosis, and hot/cold testing deferred on this date.  Reflexes Deferred  Muscle Length Hamstrings: R: Not examined L: Not examined Ely (quadriceps): R: Not examined L: Not examined Thomas (hip flexors): R: Not examined L: Not examined Ober: R: Not examined L: Not examined  Palpation Location Right Left         Lumbar paraspinals 1 1  Quadratus Lumborum 1 1  Gluteus Maximus    Gluteus Medius    Deep hip external rotators    PSIS    Fortin's Area (SIJ)    Greater Trochanter    (Blank rows = not tested) Graded on 0-4 scale (0 = no pain, 1 = pain, 2 = pain with wincing/grimacing/flinching, 3 = pain with withdrawal, 4 = unwilling to allow palpation)  Passive Accessory Intervertebral Motion Deferred  Special Tests Lumbar Radiculopathy and Discogenic: Centralization and Peripheralization (SN 92, -LR 0.12): Not examined Slump (SN 83, -LR 0.32): R: Not examined L: Not examined SLR (SN 92, -LR 0.29): R: Not examined L:  Not examined Crossed SLR (SP 90): R: Not examined L: Not examined  Facet Joint: Extension-Rotation (SN 100, -LR 0.0): R: Not examined L: Not examined  Lumbar Foraminal Stenosis: Lumbar quadrant (SN 70): R: Not examined L: Not examined  Hip: FABER (SN 81): R: Not examined L: Not examined FADIR (SN 94): R: Not examined L: Not examined Hip scour (SN 50): R: Not examined L: Not examined  SIJ:  Thigh Thrust (SN 88, -LR 0.18) : R: Not examined L: Not examined  Piriformis Syndrome: FAIR Test (SN 88, SP 83): R: Not examined L: Not examined  Functional  Tasks Deferred  Beighton scale: Deferred   TODAY'S TREATMENT:   SUBJECTIVE: Pt reports that she is doing alright today. She reports 4/10 left low back and left hip pain upon arrival. No specific questions or concerns currently.    PAIN: 4/10 left low back and left hip pain;   Manual Therapy  STM  to bilateral lumbar paraspinals and quadratus lumborum with effleurage x 8 minutes;   Ther-ex: 40 mins NuStep L2 x 20 minutes for warm-up during interval history while receiving Interferential and MH to L/S Seated figure 4 stretch 1 x 60 secs STS 2 x 10 reps with R Band Pt education regarding core engagement, stretching and movement to improve functional mobility with reduced pain.   Not performed:  Hooklying lumbar rotation rocking 2 x 60s each direction; Hooklying posterior pelvic tilts 3s hold x 10; Hooklying posterior pelvic tilt with alternating BLE marches 2 x 10 BLE; Hooklying SLR x 10 BLE; Hooklying clams with manual resistance from therapist 2 x 10 BLE; Hooklying adductor squeezes with manual resistance from therapist 2 x 10 BLE; Hooklying bridges x 10;   PATIENT EDUCATION:  Education details: Plan of care Person educated: Patient Education method: Explanation Education comprehension: verbalized understanding   HOME EXERCISE PROGRAM:  None currently   ASSESSMENT:  CLINICAL IMPRESSION: Pt requested a shorter session due to commitment. Initiated hip stretches, E/Stim interferential for pain management, and strengthening during session today. Pt without pain by the end of the session. Pt encouraged to follow-up as scheduled. Plan to progress strengthening and utilize manual therapy for pain management. Pt will benefit from PT services to address deficits in strength, ROM, and pain in order to return to full function at home.   OBJECTIVE IMPAIRMENTS: Abnormal gait, decreased activity tolerance, decreased ROM, decreased strength, obesity, and pain.   ACTIVITY  LIMITATIONS: lifting, bending, sitting, standing, and squatting  PARTICIPATION LIMITATIONS: meal prep, cleaning, laundry, driving, shopping, and community activity  PERSONAL FACTORS: Behavior pattern, Past/current experiences, Time since onset of injury/illness/exacerbation, and 3+ comorbidities: GAD, depression, BPPV, OA, obesity, and insomnia  are also affecting patient's functional outcome.   REHAB POTENTIAL: Fair    CLINICAL DECISION MAKING: Unstable/unpredictable  EVALUATION COMPLEXITY: High   GOALS: Goals reviewed with patient? No  SHORT TERM GOALS: Target date: 05/06/2023  Pt will be independent with HEP in order to improve strength and decrease back pain to improve pain-free function at home and work. Baseline:  Goal status: INITIAL   LONG TERM GOALS: Target date: 06/03/2023  Pt will increase FOTO to at least 45 to demonstrate significant improvement in function at home and work related to back pain  Baseline: 04/08/23: 38; Goal status: INITIAL  2.  Pt will decrease worst back pain by at least 2 points on the NPRS in order to demonstrate clinically significant reduction in back pain. Baseline: 04/08/23: worst: 8/10; Goal status: INITIAL  3.  Pt will decrease mODI score by at least 13 points in order demonstrate clinically significant reduction in back pain/disability.       Baseline: 04/08/23: To be completed Goal status: INITIAL   PLAN: PT FREQUENCY: 1-2x/week  PT DURATION: 8 weeks  PLANNED INTERVENTIONS: Therapeutic exercises, Therapeutic activity, Neuromuscular re-education, Balance training, Gait training, Patient/Family education, Self Care, Joint mobilization, Joint manipulation, Vestibular training, Canalith repositioning, Orthotic/Fit training, DME instructions, Dry Needling, Electrical stimulation, Spinal manipulation, Spinal mobilization, Cryotherapy, Moist heat, Taping, Traction, Ultrasound, Ionotophoresis 4mg /ml Dexamethasone, Manual therapy, and  Re-evaluation.  PLAN FOR NEXT SESSION: neural tension testing, PAIVM testing, manual techniques and TENS for pain control, progressive lumbar stabilization program;   Janet Berlin PT DPT 11:50 AM,05/07/23

## 2023-05-10 ENCOUNTER — Ambulatory Visit: Payer: 59 | Admitting: Internal Medicine

## 2023-05-10 NOTE — Progress Notes (Signed)
Pt did not have her copay, so her visit was rescheduled.

## 2023-05-11 ENCOUNTER — Ambulatory Visit: Payer: 59 | Attending: Physician Assistant

## 2023-05-11 DIAGNOSIS — M6281 Muscle weakness (generalized): Secondary | ICD-10-CM | POA: Insufficient documentation

## 2023-05-11 DIAGNOSIS — M545 Low back pain, unspecified: Secondary | ICD-10-CM | POA: Insufficient documentation

## 2023-05-11 DIAGNOSIS — G8929 Other chronic pain: Secondary | ICD-10-CM | POA: Diagnosis present

## 2023-05-11 NOTE — Therapy (Signed)
OUTPATIENT PHYSICAL THERAPY THORACOLUMBAR TREATMENT  Patient Name: Kelsey Rose MRN: [id number] DOB:[birth year], 62 y.o., female Today's Date: 05/13/2023  END OF SESSION:  PT End of Session - 05/13/23 1107     Visit Number 4    Number of Visits 17    Date for PT Re-Evaluation 06/03/23    Authorization Type eval: 04/08/23    PT Start Time 1105    PT Stop Time 1145    PT Time Calculation (min) 40 min    Activity Tolerance Patient tolerated treatment well    Behavior During Therapy WFL for tasks assessed/performed            Past Medical History:  Diagnosis Date   Anxiety    Arthritis    Asthma    COPD (chronic obstructive pulmonary disease) (HCC)    Depression    GERD (gastroesophageal reflux disease)    Past Surgical History:  Procedure Laterality Date   ABDOMINAL HYSTERECTOMY  62 yrs old   JOINT REPLACEMENT Right  May 2015   Shoulder replacement   TONSILLECTOMY AND ADENOIDECTOMY  Age 62   Patient Active Problem List   Diagnosis Date Noted   Aortic atherosclerosis (HCC) 03/25/2023   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 03/25/2023   Osteoporosis 03/25/2023   Generalized anxiety disorder 02/19/2023   Insomnia due to other mental disorder 02/19/2023   Benign paroxysmal positional vertigo 04/28/2022   Chronic bilateral low back pain without sciatica 04/28/2022   Moderate recurrent major depression (HCC) 04/28/2022   History of vitamin D deficiency 04/28/2022   Lumbar radiculopathy 04/07/2022   Chronic diarrhea 02/16/2022   Class 2 severe obesity with serious comorbidity and body mass index (BMI) of 37.0 to 37.9 in adult (HCC) 08/22/2015   Lumbar degenerative disc disease 08/22/2015   GERD (gastroesophageal reflux disease) 04/25/2015   Osteoarthritis, multiple sites 12/20/2014   Hyperlipemia 10/30/2014   Chronic obstructive pulmonary disease (HCC) 03/29/2014   Tobacco use disorder, continuous 03/29/2014   Asthma 06/05/2013   Shoulder joint pain 11/18/2012   PCP:  Remo Lipps, PA  REFERRING PROVIDER: Remo Lipps, PA  REFERRING DIAG: [phone] (ICD-10-CM) - Pain in joint of left shoulder M54.50,G89.29 (ICD-10-CM) - Chronic bilateral low back pain without sciatica M15.9 (ICD-10-CM) - Primary osteoarthritis involving multiple joints  RATIONALE FOR EVALUATION AND TREATMENT: Rehabilitation  THERAPY DIAG: Chronic left-sided low back pain without sciatica  Muscle weakness (generalized)  ONSET DATE: 03/2015 (approximate);  FOLLOW-UP APPT SCHEDULED WITH REFERRING PROVIDER: Yes, 04/16/23;  FROM INITIAL EVALUATION SUBJECTIVE:  SUBJECTIVE STATEMENT:  Chronic low back pain.   PERTINENT HISTORY:  Pt referred for chronic L sided low back pain. She reports that the pain is from arthritis in her back and occasionally refers into her left hip. Pain for started 9 to 10 years ago. She denies any traumatic onset or back surgeries. She has tried injections but was allergic to the dye. She has also tried physical therapy in the past with some benefit. Most recent lumbar MRI was 06/2015 and showed lower thoracic degenerative disc disease, with advanced changes at T12-L1. L3-4, L4-5, and L5-S1 degenerative disc disease. Mild left neural foraminal narrowing at L3-4 and L5-S1. She has a history of R shoulder replacement. The symptoms are aggravated by bending and twisting. She has tried Celebrex without benefit. She was prescribed diclofenac but was concerned about possible GIB so never filled prescription. No history of L shoulder surgery but complains of regular left shoulder pain. History of comorbid insomnia and depression. She has an appointment to follow-up with PCP for CPE in the coming months.  03/19/23 CLINICAL DATA:  Chronic osteoarthritis.  Left shoulder pain.   EXAM: LEFT  SHOULDER - 2+ VIEW   COMPARISON:  None available   FINDINGS: There is diffuse decreased bone mineralization. Mild glenohumeral joint space narrowing, subchondral sclerosis, and peripheral glenoid degenerative osteophytosis. Moderate-to-large inferior humeral head-neck junction degenerative osteophytes. There are approximate 17 mm and 16 mm ossicles overlying the axillary pouch just medial to the proximal humeral surgical neck, loose bodies.   Mild acromioclavicular joint space narrowing without significant peripheral osteophytosis.   No acute fracture or dislocation.   IMPRESSION: 1. 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 2. Two dominant loose bodies within the axillary pouch.  CLINICAL DATA:  Chronic lower back pain.   EXAM: LUMBAR SPINE - COMPLETE 4+ VIEW   COMPARISON:  None Available.   FINDINGS: There are 5 non-rib-bearing lumbar-type vertebral bodies. Minimal dextrocurvature centered at L2-3. Mild left L2-3 disc space narrowing.   Moderate anterior T12-L1 disc space narrowing and anterior endplate osteophytes. Mild anterior T11-12 disc space narrowing with moderate anterior endplate osteophytes. Minimal posterior L5-S1 disc space narrowing.   Vertebral body heights are maintained.   No pars defect is identified.   Moderate atherosclerotic calcifications.   IMPRESSION: Mild L2-3 and L5-S1 degenerative disc and endplate changes.   Moderate anterior T12-L1 degenerative disc and endplate changes.   PAIN:    Pain Intensity: Present: 7/10, Best: 4/10, Worst: 8/10 Pain location: bilateral lower lumbar spine (R side is worse) Pain Quality: throbbing,  Radiating: Yes, bilateral buttocks Numbness/Tingling: No Focal Weakness: No Aggravating factors: bending, twisting, lifting, extended standing, cooking, washing dishes, extended sitting; Easing factors: Lidocaine patches, hot shower, dry needling, home TENS; 24-hour pain behavior: increased pain when first  waking but pain stays intense throughout the day; History of prior back injury, pain, surgery, or therapy: Yes Dominant hand: right Imaging: Yes, plain films and MRI (see history);  Red flags: Positive: worsening urinary incontinence; Negative for saddle paresthesia, personal history of cancer, h/o spinal tumors, h/o compression fx, h/o abdominal aneurysm, abdominal pain, chills/fever, night sweats, nausea, vomiting, unrelenting pain, first onset of insidious LBP <20 y/o  PRECAUTIONS: None  WEIGHT BEARING RESTRICTIONS: No  FALLS: Has patient fallen in last 6 months? No  Living Environment Lives with:  Lives with a friend Lives in: House/apartment Stairs: 2 steps to enter, 1 step inside;  Prior level of function: Independent  Occupational demands: On disability  Hobbies: Walk dog, play on phone, watch TV  Patient Goals: reduce/eliminate pain, be more active   OBJECTIVE:  Patient Surveys  Modified Oswestry:  FOTO: 38, predicted improvement to 58;   Cognition Patient is oriented to person, place, and time.  Recent memory is intact.  Remote memory is intact.  Attention span and concentration are intact.  Expressive speech is intact.  Patient's fund of knowledge is within normal limits for educational level.    Gross Musculoskeletal Assessment Tremor: None Bulk: Normal Tone: Normal No visible step-off along spinal column, no signs of scoliosis  GAIT: Decreased self-selected gait speed. Gait appears antalgic;  Posture: Lumbar lordosis: Decreased normal lordosis Iliac crest height: Equal bilaterally Lumbar lateral shift: Negative  AROM AROM (Normal range in degrees) AROM   Lumbar   Flexion (65) Mild loss*  Extension (30) Mod loss*  Right lateral flexion (25) Mod loss*  Left lateral flexion (25) Severe loss*  Right rotation (30) Mild loss  Left rotation (30) Mild loss  (* = pain; Blank rows = not tested)  LE MMT: MMT (out of 5) Right  Left   Hip flexion  4+* 4+*  Hip extension    Hip abduction (seated) 5 5  Hip adduction (seated) 5 5  Hip internal rotation    Hip external rotation    Knee flexion 5 5  Knee extension 5 5  Ankle dorsiflexion 5 5  Ankle plantarflexion Strong Strong  (* = pain; Blank rows = not tested)  Sensation Grossly intact to light touch throughout bilateral LEs as determined by testing dermatomes L2-S2. Proprioception, stereognosis, and hot/cold testing deferred on this date.  Reflexes Deferred  Muscle Length Hamstrings: R: Not examined L: Not examined Ely (quadriceps): R: Not examined L: Not examined Thomas (hip flexors): R: Not examined L: Not examined Ober: R: Not examined L: Not examined  Palpation Location Right Left         Lumbar paraspinals 1 1  Quadratus Lumborum 1 1  Gluteus Maximus    Gluteus Medius    Deep hip external rotators    PSIS    Fortin's Area (SIJ)    Greater Trochanter    (Blank rows = not tested) Graded on 0-4 scale (0 = no pain, 1 = pain, 2 = pain with wincing/grimacing/flinching, 3 = pain with withdrawal, 4 = unwilling to allow palpation)  Passive Accessory Intervertebral Motion Deferred  Special Tests Lumbar Radiculopathy and Discogenic: Centralization and Peripheralization (SN 92, -LR 0.12): Not examined Slump (SN 83, -LR 0.32): R: Not examined L: Not examined SLR (SN 92, -LR 0.29): R: Not examined L:  Not examined Crossed SLR (SP 90): R: Not examined L: Not examined  Facet Joint: Extension-Rotation (SN 100, -LR 0.0): R: Not examined L: Not examined  Lumbar Foraminal Stenosis: Lumbar quadrant (SN 70): R: Not examined L: Not examined  Hip: FABER (SN 81): R: Not examined L: Not examined FADIR (SN 94): R: Not examined L: Not examined Hip scour (SN 50): R: Not examined L: Not examined  SIJ:  Thigh Thrust (SN 88, -LR 0.18) : R: Not examined L: Not examined  Piriformis Syndrome: FAIR Test (SN 88, SP 83): R: Not examined L: Not examined  Functional  Tasks Deferred  Beighton scale: Deferred   TODAY'S TREATMENT:   SUBJECTIVE: Pt reports that she is feeling slightly depressed today thinking about the loss of her daughter. She reports ongoing chronic left low back and left hip pain upon arrival but does not rate on NPS. No specific questions or concerns currently.  PAIN: Left low back and left hip pain, not rated;   Manual Therapy  STM to bilateral lumbar paraspinals and quadratus lumborum with effleurage and hypervolt x 8 minutes;   Ther-ex: 40 mins NuStep L2 x 10 minutes for warm-up during interval history; Hooklying lumbar rotation rocking x 60s each direction; Hooklying alternating BLE marches 2 x 10 BLE; Hooklying SLR x 10 BLE; Hooklying clams with manual resistance from therapist x 10 BLE; Hooklying adductor squeezes with manual resistance from therapist x 10 BLE; Hooklying bridges x 10;   PATIENT EDUCATION:  Education details: Plan of care Person educated: Patient Education method: Explanation Education comprehension: verbalized understanding   HOME EXERCISE PROGRAM:  None currently   ASSESSMENT:  CLINICAL IMPRESSION: Pt demonstrates excellent participation with therapy but does have a flatter affect today compared to prior sessions. Progressed strengthening and utilized STM for tissue extensibility and pain control. Pt encouraged to follow-up as scheduled. Plan to progress strengthening and utilize manual therapy as needed for pain management. Pt will benefit from PT services to address deficits in strength, ROM, and pain in order to return to full function at home.   OBJECTIVE IMPAIRMENTS: Abnormal gait, decreased activity tolerance, decreased ROM, decreased strength, obesity, and pain.   ACTIVITY LIMITATIONS: lifting, bending, sitting, standing, and squatting  PARTICIPATION LIMITATIONS: meal prep, cleaning, laundry, driving, shopping, and community activity  PERSONAL FACTORS: Behavior pattern,  Past/current experiences, Time since onset of injury/illness/exacerbation, and 3+ comorbidities: GAD, depression, BPPV, OA, obesity, and insomnia  are also affecting patient's functional outcome.   REHAB POTENTIAL: Fair    CLINICAL DECISION MAKING: Unstable/unpredictable  EVALUATION COMPLEXITY: High   GOALS: Goals reviewed with patient? No  SHORT TERM GOALS: Target date: 05/06/2023  Pt will be independent with HEP in order to improve strength and decrease back pain to improve pain-free function at home and work. Baseline:  Goal status: INITIAL   LONG TERM GOALS: Target date: 06/03/2023  Pt will increase FOTO to at least 45 to demonstrate significant improvement in function at home and work related to back pain  Baseline: 04/08/23: 38; Goal status: INITIAL  2.  Pt will decrease worst back pain by at least 2 points on the NPRS in order to demonstrate clinically significant reduction in back pain. Baseline: 04/08/23: worst: 8/10; Goal status: INITIAL  3.  Pt will decrease mODI score by at least 13 points in order demonstrate clinically significant reduction in back pain/disability.       Baseline: 04/08/23: To be completed Goal status: INITIAL   PLAN: PT FREQUENCY: 1-2x/week  PT DURATION: 8 weeks  PLANNED INTERVENTIONS: Therapeutic exercises, Therapeutic activity, Neuromuscular re-education, Balance training, Gait training, Patient/Family education, Self Care, Joint mobilization, Joint manipulation, Vestibular training, Canalith repositioning, Orthotic/Fit training, DME instructions, Dry Needling, Electrical stimulation, Spinal manipulation, Spinal mobilization, Cryotherapy, Moist heat, Taping, Traction, Ultrasound, Ionotophoresis 4mg /ml Dexamethasone, Manual therapy, and Re-evaluation.  PLAN FOR NEXT SESSION: neural tension testing, PAIVM testing, manual techniques and TENS for pain control, progressive lumbar stabilization program;   Lynnea Maizes PT, DPT, GCS  11:17  AM,05/13/23

## 2023-05-14 ENCOUNTER — Ambulatory Visit: Payer: 59

## 2023-05-18 ENCOUNTER — Ambulatory Visit: Payer: 59

## 2023-05-21 ENCOUNTER — Ambulatory Visit: Payer: 59

## 2023-05-23 DIAGNOSIS — Z79899 Other long term (current) drug therapy: Secondary | ICD-10-CM | POA: Insufficient documentation

## 2023-05-23 DIAGNOSIS — Z789 Other specified health status: Secondary | ICD-10-CM | POA: Insufficient documentation

## 2023-05-23 DIAGNOSIS — G894 Chronic pain syndrome: Secondary | ICD-10-CM | POA: Insufficient documentation

## 2023-05-23 DIAGNOSIS — M899 Disorder of bone, unspecified: Secondary | ICD-10-CM | POA: Insufficient documentation

## 2023-05-23 NOTE — Progress Notes (Signed)
Patient: Kelsey Rose  Service Category: E/M  Provider: Oswaldo Done, MD  DOB: Nov 24, 1960  DOS: 05/24/2023  Referring Provider: Remo Lipps, PA  MRN: [id number]  Setting: Ambulatory outpatient  PCP: Remo Lipps, PA  Type: New Patient  Specialty: Interventional Pain Management    Location: Office  Delivery: Face-to-face     Primary Reason(s) for Visit: Encounter for initial evaluation of one or more chronic problems (new to examiner) potentially causing chronic pain, and posing a threat to normal musculoskeletal function. (Level of risk: High) CC: Back Pain (lower)  HPI  Kelsey Rose is a 62 y.o. year old, female patient, who comes for the first time to our practice referred by Remo Lipps, PA for our initial evaluation of her chronic pain. She has Chronic obstructive pulmonary disease (HCC); Tobacco use disorder, continuous; Class 2 severe obesity with serious comorbidity and body mass index (BMI) of 37.0 to 37.9 in adult [facility]); DDD (degenerative disc disease), lumbosacral; GERD (gastroesophageal reflux disease); Hyperlipemia; Chronic diarrhea; Benign paroxysmal positional vertigo; Asthma; Chronic low back pain (1ry area of Pain) (Bilateral) (L>R) w/o sciatica; Moderate recurrent major depression (HCC); History of vitamin D deficiency; Pain in shoulder region after shoulder replacement (Right); Generalized anxiety disorder; Insomnia due to other mental disorder; Aortic atherosclerosis (HCC); 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; Osteoporosis; Chronic pain syndrome; Pharmacologic therapy; Disorder of skeletal system; Problems influencing health status; Abnormal MRI, lumbar spine (06/17/2015); Chronic hip pain (4th area of Pain) (Bilateral) (L>R); Chronic shoulder pain (2ry area of Pain) (Bilateral) (L>R); History of total shoulder replacement (Right); Osteoarthritis involving multiple joints; and Chronic neck pain (3ry area of Pain) (Midline) on their problem list. Today she comes in for  evaluation of her Back Pain (lower)  Pain Assessment: Location: Lower Back Radiating: left buttocks, both leg to the feet Onset: More than a month ago Duration: Chronic pain Quality: Stabbing, Throbbing Severity: 6 /10 (subjective, self-reported pain score)  Effect on ADL:   Timing: Constant Modifying factors: PT, exercise, heat, ice, TENS BP: (!) 145/91  HR: 89  Onset and Duration: Gradual and Present longer than 3 months Cause of pain: Arthritis Severity: Getting worse, NAS-11 at its worse: 9/10, NAS-11 at its best: 6/10, NAS-11 now: 6/10, and NAS-11 on the average: 7/10 Timing: Not influenced by the time of the day Aggravating Factors: Bending, Climbing, Kneeling, Lifiting, Prolonged sitting, Prolonged standing, Squatting, Stooping , Twisting, Walking, Walking uphill, Walking downhill, and Working Alleviating Factors: Acupuncture, Stretching, Hot packs, Lying down, Medications, Resting, TENS, Using a brace, and Warm showers or baths Associated Problems: Depression, Dizziness, Fatigue, Nausea, Numbness, Sadness, and Pain that wakes patient up Quality of Pain: Disabling, Heavy, Horrible, and Sharp Previous Examinations or Tests: Bone scan, Cutaneous Pain Threshold Testing (CPT), CT scan, MRI scan, Nerve block, X-rays, Orthopedic evaluation, and Psychiatric evaluation Previous Treatments: Physical Therapy, Relaxation therapy, Strengthening exercises, Stretching exercises, and TENS  Kelsey Rose is being evaluated for possible interventional pain management therapies for the treatment of her chronic pain.   According to the patient the primary area of pain is that of the lower back (Bilateral) (L>R).  The patient denies any prior surgeries but she does admit to having had some x-rays of the lumbar spine.  Review of those x-rays show no flexion and extension views.  The patient also has an MRI of the lumbar spine but this was done on 06/17/2015.  That MRI demonstrate lower thoracic  degenerative disc disease with advanced changes at the T12-L1 level.  In addition there is evidence of degenerative disc disease affecting the L3-4, L4-5, and L5-S1 levels.  The MRI also describes a left neuroforaminal narrowing at L3-4 and L5-S1.  At the L3-4 level there is a diffuse disc bulge with a superimposed left foraminal disc protrusion, ligamentum flavum hypertrophy, and mild facet arthropathy with mild left foraminal narrowing.  At the L4-5 level there is a diffuse disc bulge with ligamentum flavum hypertrophy, mild facet arthropathy, and a tiny superimposed left foraminal disc protrusion.  At the L5-S1 level there is diffuse disc bulge, ligamentum flavum hypertrophy, and mild facet arthropathy resulting in mild left neural foraminal narrowing.  These changes were present on 2016 and likely to have worsened since.  She describes having had multiple referrals to physical therapy where she has gone twice per week x 4 weeks, with fairly good results unfortunately they do not last but approximately 1 day.  She is currently undergoing further physical therapy.  When asked about prior nerve blocks she indicated having had an attempt at having some nerve blocks done in [place] around 2017.  She describes that the plan was to do 3 levels but on the second level she started feeling lightheaded, nauseous, and experiencing cold sweats and hyperventilation.  At the time the procedure was being done without any type of IV sedation and apparently minimal monitoring since I asked her about her heart rate and blood pressure going down and she indicated that they did not know since they were not checking on that.  She states that on that second level they stopped the procedure and it was not repeated.  She was told that she was "allergic to iodine contrast".  Review of the electronic medical record reveals that on 01/28/2016 the patient underwent a left-sided L3/L4 "or" L4/L5 facet joint injection under fluoroscopy.   According to the note they did inject a small amount of contrast into the facet joints.  However they indicate that the patient did not have any type of side effects or adverse reactions and no complications to the procedure.  According to the information, it was billed as a 3 level procedure.  Preprocedure pain score was documented as a 6/10 and the postprocedure score has a 0/10.  According to the note the "postprocedure exam" showed marked reduction in pain with functional improvement in the lumbar spine range of motion with ability to extend pain-free with no new neuromotor compromise.  The procedure was apparently done by Windell Norfolk, MD [facility]).   Note: Description of the patient's event would suggest that she had a vasovagal reaction when having the facet joint injections.  The patient has secondary area pain at site of the shoulders (Bilateral) (L>R).  The patient does have a history of a right shoulder total arthroplasty.  Unfortunately she still having pain in that shoulder.  She admits to having had surgery for the shoulder pain but she describes that it did not help.  She describes having had some x-rays of the shoulder in the past 2 years and having had a joint injection around 2019 which did not last.  She also vaguely remembers having had a nerve block in the area of the neck for her right shoulder.  The patient denies any other shoulder surgeries or arthroscopies.  She does admit to having had a recent left shoulder x-ray which is available to [place] and we have provided her with a copy of that x-ray.  In addition, she also had a  joint injection done in 2019 which according to her did not last long.  Review of the electronic medical record reveals the patient to have had a Diagnostic/therapeutic left glenohumeral (shoulder) joint inj. x1 (05/19/2021) by Dian Situ, MD. [facility]).  The patient's third area pain is that of the neck (posterior aspect) (Midline).   She denies any prior surgeries, recent x-rays, nerve blocks, or physical therapy for the neck pain.  Other than the shoulder pain, she denies any upper extremity pain, numbness, or weakness suggestive of any radiculopathies.  The patient's fourth area pain is that of the lower extremities (Bilateral) (L>R).  She describes the pain in the left lower extremity as going down the lateral aspect of the upper thigh down to the knee, but no abnormal signs or symptoms below the knee.  Specifically she denies any pain, numbness, or weakness.  She describes that the left lower extremity pain has been intermittent and throbbing.  She describes that it runs through the lateral aspect of the thigh, straight down, with no radicular pattern.  In terms of the right lower extremity pain she describes that it is identical to the left, but less frequent.  Pain pattern seems to follow that off hip joint arthralgia.  Pharmacotherapy: The patient indicates not taking any medication for the pain.   Other analgesic therapies: She indicates the morning and frequently using a TENS unit.  Physical exam: The patient was able to toe walk and heel walk without any major problems.  She does have some instability that may come from deconditioning.  She is morbidly obese with a truncal obesity pattern.  BMI is 36.48 kg/m.  Lumbar spine flexion maneuver demonstrated relatively normal range of motion for the patient indicated that it felt like a good stretch.  She did experience some discomfort across the lower back during the recovery portion of the maneuver.  Lumbar spine hyperextension showed decreased range of motion with pain across the lumbar spine.  Lumbar spine hyperextension and rotation also demonstrated decreased range of motion worse towards the right side with reproduction of the patient's low back pain.  Maneuver seem to be compatible with bilateral lumbar facet arthralgia and results of Kemp maneuver (concordant).  Patrick  maneuver was negative bilaterally for sacroiliac joint pain but positive bilaterally for hip joint pain on active and of range of motion.  On the left side this trigger pain referred towards the left buttocks area where she had previously complained of having some of her usual pain.  Medical comorbidities: Morbid obesity (36.48 kg/m BMI); current smoker with COPD and bronchial asthma; GERD; generalized anxiety disorder and history of depression.  DEXA scan with evidence of osteoporosis.  The Kelsey Rose has been informed that this initial visit was an evaluation only.  On the follow up appointment I will go over the results, including ordered tests and available interventional therapies. At that time she will have the opportunity to decide whether to proceed with offered therapies or not. In the event that Kelsey Rose prefers avoiding interventional options, this will conclude our involvement in the case.  Medication management recommendations may be provided upon request.  Historic Controlled Substance Pharmacotherapy Review  PMP and historical list of controlled substances: None Most recently prescribed opioid analgesics:   None MME/day: 0 mg/day  Historical Monitoring: The patient  reports that she does not currently use drugs. List of prior UDS Testing: No results found for: "MDMA", "COCAINSCRNUR", "PCPSCRNUR", "PCPQUANT", "CANNABQUANT", "THCU", "ETH", "CBDTHCR", "D8THCCBX", "D9THCCBX" Historical  Background Evaluation: [place] PMP: PDMP reviewed during this encounter. Review of the past 89-months conducted.             PMP NARX Score Report:  Narcotic: 000 Sedative: 000 Stimulant: 000  Department of public safety, offender search: Engineer, mining Information) Non-contributory Risk Assessment Profile: Aberrant behavior: None observed or detected today Risk factors for fatal opioid overdose: None identified today PMP NARX Overdose Risk Score: 000 Fatal overdose hazard ratio (HR): Calculation  deferred Non-fatal overdose hazard ratio (HR): Calculation deferred Risk of opioid abuse or dependence: 0.7-3.0% with doses ? 36 MME/day and 6.1-26% with doses ? 120 MME/day. Substance use disorder (SUD) risk level: See below Personal History of Substance Abuse (SUD-Substance use disorder):  Alcohol: Negative  Illegal Drugs: Negative  Rx Drugs: Negative  ORT Risk Level calculation: Moderate Risk  Opioid Risk Tool - 05/24/23 1306       Family History of Substance Abuse   Alcohol Positive Female    Illegal Drugs Negative    Rx Drugs Positive Female or Female      Personal History of Substance Abuse   Alcohol Negative    Illegal Drugs Negative    Rx Drugs Negative      Age   Age between 22-45 years  No      History of Preadolescent Sexual Abuse   History of Preadolescent Sexual Abuse Negative or Female      Psychological Disease   Psychological Disease Negative    Depression Positive      Total Score   Opioid Risk Tool Scoring 6    Opioid Risk Interpretation Moderate Risk            ORT Scoring interpretation table:  Score <3 = Low Risk for SUD  Score between 4-7 = Moderate Risk for SUD  Score >8 = High Risk for Opioid Abuse   PHQ-2 Depression Scale:  Total score: 0  PHQ-2 Scoring interpretation table: (Score and probability of major depressive disorder)  Score 0 = No depression  Score 1 = 15.4% Probability  Score 2 = 21.1% Probability  Score 3 = 38.4% Probability  Score 4 = 45.5% Probability  Score 5 = 56.4% Probability  Score 6 = 78.6% Probability   PHQ-9 Depression Scale:  Total score: 0  PHQ-9 Scoring interpretation table:  Score 0-4 = No depression  Score 5-9 = Mild depression  Score 10-14 = Moderate depression  Score 15-19 = Moderately severe depression  Score 20-27 = Severe depression (2.4 times higher risk of SUD and 2.89 times higher risk of overuse)   Pharmacologic Plan: As per protocol, I have not taken over any controlled substance management,  pending the results of ordered tests and/or consults.            Initial impression: Pending review of available data and ordered tests.  Meds   Current Outpatient Medications:    albuterol (ACCUNEB) 1.25 MG/3ML nebulizer solution, Take 1 ampule by nebulization every 6 (six) hours as needed for wheezing., Disp: , Rfl:    albuterol (PROVENTIL HFA;VENTOLIN HFA) 108 (90 Base) MCG/ACT inhaler, Inhale 2 puffs into the lungs every 6 (six) hours as needed for wheezing or shortness of breath., Disp: 1 Inhaler, Rfl: 2   alendronate (FOSAMAX) 70 MG tablet, Take 1 tablet (70 mg total) by mouth every 7 (seven) days. Take with a full glass of water on an empty stomach., Disp: 4 tablet, Rfl: 11   atorvastatin (LIPITOR) 20 MG tablet, Take 1 tablet (20 mg  total) by mouth daily., Disp: 90 tablet, Rfl: 1   citalopram (CELEXA) 40 MG tablet, Take 40 mg by mouth daily., Disp: , Rfl:    Dextromethorphan-buPROPion ER (AUVELITY) 45-105 MG TBCR, Take 1 tablet by mouth in the morning and at bedtime. Start with just 1 tab each morning, then increase to every 12h after 3 days, Disp: 60 tablet, Rfl: 2   lidocaine (LIDODERM) 5 %, Place 1 patch onto the skin daily. Remove & Discard patch within 12 hours or as directed by MD, Disp: , Rfl:    loperamide (IMODIUM A-D) 2 MG tablet, Take 2 mg by mouth 4 (four) times daily as needed for diarrhea or loose stools., Disp: , Rfl:    nystatin powder, Apply 1 Application topically 3 (three) times daily. PRN, Disp: , Rfl:    ondansetron (ZOFRAN) 4 MG tablet, Take 4 mg by mouth every 8 (eight) hours as needed for nausea or vomiting., Disp: , Rfl:    Tiotropium Bromide-Olodaterol (STIOLTO RESPIMAT) 2.5-2.5 MCG/ACT AERS, Inhale 2 each into the lungs daily., Disp: , Rfl:    Albuterol-Budesonide (AIRSUPRA) 90-80 MCG/ACT AERO, Inhale 2 puffs into the lungs every 4 (four) hours as needed (for cough/wheezing)., Disp: 5.9 g, Rfl: 5   bismuth subsalicylate (PEPTO BISMOL) 262 MG chewable tablet, Chew  524 mg by mouth as needed., Disp: , Rfl:   Imaging Review  Shoulder Imaging: Shoulder-L DG: Results for orders placed in visit on 03/19/23 DG Shoulder Left  Narrative CLINICAL DATA:  Chronic osteoarthritis.  Left shoulder pain.  EXAM: LEFT SHOULDER - 2+ VIEW  COMPARISON:  None available  FINDINGS: There is diffuse decreased bone mineralization. Mild glenohumeral joint space narrowing, subchondral sclerosis, and peripheral glenoid degenerative osteophytosis. Moderate-to-large inferior humeral head-neck junction degenerative osteophytes. There are approximate 17 mm and 16 mm ossicles overlying the axillary pouch just medial to the proximal humeral surgical neck, loose bodies.  Mild acromioclavicular joint space narrowing without significant peripheral osteophytosis.  No acute fracture or dislocation.  IMPRESSION: 1. 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 2. Two dominant loose bodies within the axillary pouch.   Electronically Signed By: Neita Garnet M.D. On: 03/24/2023 15:15  Lumbosacral Imaging: Lumbar DG (Complete) 4+V: Results for orders placed in visit on 03/19/23 DG Lumbar Spine Complete  Narrative CLINICAL DATA:  Chronic lower back pain.  EXAM: LUMBAR SPINE - COMPLETE 4+ VIEW  COMPARISON:  None Available.  FINDINGS: There are 5 non-rib-bearing lumbar-type vertebral bodies. Minimal dextrocurvature centered at L2-3. Mild left L2-3 disc space narrowing.  Moderate anterior T12-L1 disc space narrowing and anterior endplate osteophytes. Mild anterior T11-12 disc space narrowing with moderate anterior endplate osteophytes. Minimal posterior L5-S1 disc space narrowing.  Vertebral body heights are maintained.  No pars defect is identified.  Moderate atherosclerotic calcifications.  IMPRESSION: Mild L2-3 and L5-S1 degenerative disc and endplate changes.  Moderate anterior T12-L1 degenerative disc and endplate changes.   Electronically  Signed By: Neita Garnet M.D. On: 03/24/2023 15:20  Complexity Note: Imaging results reviewed.                         ROS  Cardiovascular: No reported cardiovascular signs or symptoms such as High blood pressure, coronary artery disease, abnormal heart rate or rhythm, heart attack, blood thinner therapy or heart weakness and/or failure Pulmonary or Respiratory: Lung problems, Wheezing and difficulty taking a deep full breath (Asthma), Snoring , and Temporary stoppage of breathing during sleep Neurological: No reported neurological signs or symptoms such as  seizures, abnormal skin sensations, urinary and/or fecal incontinence, being born with an abnormal open spine and/or a tethered spinal cord Psychological-Psychiatric: Anxiousness, Depressed, and Prone to panicking Gastrointestinal: Alternating episodes iof diarrhea and constipation (IBS-Irritable bowe syndrome) Genitourinary: No reported renal or genitourinary signs or symptoms such as difficulty voiding or producing urine, peeing blood, non-functioning kidney, kidney stones, difficulty emptying the bladder, difficulty controlling the flow of urine, or chronic kidney disease Hematological: No reported hematological signs or symptoms such as prolonged bleeding, low or poor functioning platelets, bruising or bleeding easily, hereditary bleeding problems, low energy levels due to low hemoglobin or being anemic Endocrine: No reported endocrine signs or symptoms such as high or low blood sugar, rapid heart rate due to high thyroid levels, obesity or weight gain due to slow thyroid or thyroid disease Rheumatologic: No reported rheumatological signs and symptoms such as fatigue, joint pain, tenderness, swelling, redness, heat, stiffness, decreased range of motion, with or without associated rash Musculoskeletal: Negative for myasthenia gravis, muscular dystrophy, multiple sclerosis or malignant hyperthermia Work History: Disabled  Allergies  Ms.  Rose is allergic to sulfa antibiotics, iodinated contrast media, and penicillins.  Laboratory Chemistry Profile   Renal Lab Results  Component Value Date   BUN 11 01/19/2023   CREATININE 0.84 01/19/2023   BCR 13 01/19/2023     Electrolytes Lab Results  Component Value Date   NA 139 01/19/2023   K 5.0 01/19/2023   CL 99 01/19/2023   CALCIUM 9.8 01/19/2023     Hepatic Lab Results  Component Value Date   AST 15 01/19/2023   ALT 13 01/19/2023   ALBUMIN 4.2 01/19/2023   ALKPHOS 116 01/19/2023     ID No results found for: "LYMEIGGIGMAB", "HIV", "SARSCOV2NAA", "STAPHAUREUS", "MRSAPCR", "HCVAB", "PREGTESTUR", "RMSFIGG", "QFVRPH1IGG", "QFVRPH2IGG"   Bone Lab Results  Component Value Date   VD25OH 37.7 01/19/2023     Endocrine Lab Results  Component Value Date   GLUCOSE 89 01/19/2023   HGBA1C 5.5 12/20/2014   TSH 1.810 01/19/2023     Neuropathy Lab Results  Component Value Date   HGBA1C 5.5 12/20/2014     CNS No results found for: "COLORCSF", "APPEARCSF", "RBCCOUNTCSF", "WBCCSF", "POLYSCSF", "LYMPHSCSF", "EOSCSF", "PROTEINCSF", "GLUCCSF", "JCVIRUS", "CSFOLI", "IGGCSF", "LABACHR", "ACETBL"   Inflammation (CRP: Acute  ESR: Chronic) No results found for: "CRP", "ESRSEDRATE", "LATICACIDVEN"   Rheumatology No results found for: "RF", "ANA", "LABURIC", "URICUR", "LYMEIGGIGMAB", "LYMEABIGMQN", "HLAB27"   Coagulation Lab Results  Component Value Date   PLT 234 01/19/2023     Cardiovascular Lab Results  Component Value Date   HGB 15.3 01/19/2023   HCT 44.5 01/19/2023     Screening No results found for: "SARSCOV2NAA", "COVIDSOURCE", "STAPHAUREUS", "MRSAPCR", "HCVAB", "HIV", "PREGTESTUR"   Cancer No results found for: "CEA", "CA125", "LABCA2"   Allergens No results found for: "ALMOND", "APPLE", "ASPARAGUS", "AVOCADO", "BANANA", "BARLEY", "BASIL", "BAYLEAF", "GREENBEAN", "LIMABEAN", "WHITEBEAN", "BEEFIGE", "REDBEET", "BLUEBERRY", "BROCCOLI", "CABBAGE", "MELON",  "CARROT", "CASEIN", "CASHEWNUT", "CAULIFLOWER", "CELERY"     Note: Lab results reviewed.  PFSH  Drug: Kelsey Rose  reports that she does not currently use drugs. Alcohol:  reports no history of alcohol use. Tobacco:  reports that she has been smoking cigarettes. She has a 53 pack-year smoking history. She does not have any smokeless tobacco history on file. Medical:  has a past medical history of Anxiety, Arthritis, Asthma, COPD (chronic obstructive pulmonary disease) (HCC), Depression, and GERD (gastroesophageal reflux disease). Family: family history includes COPD in her sister and sister; Cirrhosis in her father; Dementia in  her mother; Depression in her sister and sister; Diabetes in her daughter; Gallstones in her mother; Heart attack in her brother; Osteoarthritis in her mother.  Past Surgical History:  Procedure Laterality Date   ABDOMINAL HYSTERECTOMY  62 yrs old   JOINT REPLACEMENT Right  May 2015   Shoulder replacement   TONSILLECTOMY AND ADENOIDECTOMY  Age 60   Active Ambulatory Problems    Diagnosis Date Noted   Chronic obstructive pulmonary disease (HCC) 03/29/2014   Tobacco use disorder, continuous 03/29/2014   Class 2 severe obesity with serious comorbidity and body mass index (BMI) of 37.0 to 37.9 in adult (HCC) 08/22/2015   DDD (degenerative disc disease), lumbosacral 08/22/2015   GERD (gastroesophageal reflux disease) 04/25/2015   Hyperlipemia 10/30/2014   Chronic diarrhea 02/16/2022   Benign paroxysmal positional vertigo 04/28/2022   Asthma 06/05/2013   Chronic low back pain (1ry area of Pain) (Bilateral) (L>R) w/o sciatica 04/28/2022   Moderate recurrent major depression (HCC) 04/28/2022   History of vitamin D deficiency 04/28/2022   Pain in shoulder region after shoulder replacement (Right) 11/18/2012   Generalized anxiety disorder 02/19/2023   Insomnia due to other mental disorder 02/19/2023   Aortic atherosclerosis (HCC) 03/25/2023   Osteophyte determined by  x-ray 03/25/2023   Osteoporosis 03/25/2023   Chronic pain syndrome 05/23/2023   Pharmacologic therapy 05/23/2023   Disorder of skeletal system 05/23/2023   Problems influencing health status 05/23/2023   Abnormal MRI, lumbar spine (06/17/2015) 05/24/2023   Chronic hip pain (4th area of Pain) (Bilateral) (L>R) 05/24/2023   Chronic shoulder pain (2ry area of Pain) (Bilateral) (L>R) 05/24/2023   History of total shoulder replacement (Right) 05/24/2023   Osteoarthritis involving multiple joints 05/24/2023   Chronic neck pain (3ry area of Pain) (Midline) 05/24/2023   Resolved Ambulatory Problems    Diagnosis Date Noted   Lumbar radiculopathy 04/07/2022   Personal history of nicotine dependence 04/28/2022   Past Medical History:  Diagnosis Date   Anxiety    Arthritis    COPD (chronic obstructive pulmonary disease) (HCC)    Depression    Constitutional Exam  General appearance: Well nourished, well developed, and well hydrated. In no apparent acute distress Vitals:   05/24/23 1302  BP: (!) 145/91  Pulse: 89  Resp: 18  Temp: (!) 97 F (36.1 C)  TempSrc: Temporal  SpO2: 97%  Weight: 226 lb (102.5 kg)  Height: 5\' 6"  (1.676 m)   BMI Assessment: Estimated body mass index is 36.48 kg/m as calculated from the following:   Height as of this encounter: 5\' 6"  (1.676 m).   Weight as of this encounter: 226 lb (102.5 kg).  BMI interpretation table: BMI level Category Range association with higher incidence of chronic pain  <18 kg/m2 Underweight   18.5-24.9 kg/m2 Ideal body weight   25-29.9 kg/m2 Overweight Increased incidence by 20%  30-34.9 kg/m2 Obese (Class I) Increased incidence by 68%  35-39.9 kg/m2 Severe obesity (Class II) Increased incidence by 136%  >40 kg/m2 Extreme obesity (Class III) Increased incidence by 254%   Patient's current BMI Ideal Body weight  Body mass index is 36.48 kg/m. Ideal body weight: 59.3 kg (130 lb 11.7 oz) Adjusted ideal body weight: 76.6 kg (168  lb 13.4 oz)   BMI Readings from Last 4 Encounters:  05/24/23 36.48 kg/m  04/14/23 37.93 kg/m  03/19/23 38.25 kg/m  03/03/23 37.77 kg/m   Wt Readings from Last 4 Encounters:  05/24/23 226 lb (102.5 kg)  04/14/23 235 lb (106.6 kg)  03/19/23 237 lb (107.5 kg)  03/03/23 234 lb (106.1 kg)    Psych/Mental status: Alert, oriented x 3 (person, place, & time)       Eyes: PERLA Respiratory: No evidence of acute respiratory distress  Assessment  Primary Diagnosis & Pertinent Problem List: The primary encounter diagnosis was Chronic pain syndrome. Diagnoses of Chronic low back pain (1ry area of Pain) (Bilateral) (L>R) w/o sciatica, Chronic shoulder pain (2ry area of Pain) (Bilateral) (L>R), Chronic neck pain (3ry area of Pain) (Midline), Chronic hip pain (4th area of Pain) (Bilateral) (L>R), DDD (degenerative disc disease), lumbosacral, Pain in shoulder region after shoulder replacement (Right), History of total shoulder replacement (Right), Osteoarthritis involving multiple joints, Pharmacologic therapy, Abnormal MRI, lumbar spine (06/17/2015), Disorder of skeletal system, Problems influencing health status, History of vitamin D deficiency, and Other osteoporosis without current pathological fracture were also pertinent to this visit.  Visit Diagnosis (New problems to examiner): 1. Chronic pain syndrome   2. Chronic low back pain (1ry area of Pain) (Bilateral) (L>R) w/o sciatica   3. Chronic shoulder pain (2ry area of Pain) (Bilateral) (L>R)   4. Chronic neck pain (3ry area of Pain) (Midline)   5. Chronic hip pain (4th area of Pain) (Bilateral) (L>R)   6. DDD (degenerative disc disease), lumbosacral   7. Pain in shoulder region after shoulder replacement (Right)   8. History of total shoulder replacement (Right)   9. Osteoarthritis involving multiple joints   10. Pharmacologic therapy   11. Abnormal MRI, lumbar spine (06/17/2015)   12. Disorder of skeletal system   13. Problems  influencing health status   14. History of vitamin D deficiency   15. Other osteoporosis without current pathological fracture    Plan of Care (Initial workup plan)  Note: Kelsey Rose was reminded that as per protocol, today's visit has been an evaluation only. We have not taken over the patient's controlled substance management.  Problem-specific plan: No problem-specific Assessment & Plan notes found for this encounter.  Lab Orders         Compliance Drug Analysis, Ur         Comp. Metabolic Panel (12)         Magnesium         Vitamin B12         Sedimentation rate         25-Hydroxy vitamin D Lcms D2+D3         C-reactive protein     Imaging Orders         DG Lumbar Spine Complete W/Bend         DG HIP UNILAT W OR W/O PELVIS 2-3 VIEWS RIGHT         DG HIP UNILAT W OR W/O PELVIS 2-3 VIEWS LEFT         DG Cervical Spine With Flex & Extend         DG Shoulder Right     Referral Orders  No referral(s) requested today   Procedure Orders    No procedure(s) ordered today   Pharmacotherapy (current): Medications ordered:  No orders of the defined types were placed in this encounter.  Medications administered during this visit: Jayleigh F. Kolinski had no medications administered during this visit.   Analgesic Pharmacotherapy:  Opioid Analgesics: For patients currently taking or requesting to take opioid analgesics, in accordance with [facility] Guidelines, we will assess their risks and indications for the use of these substances. After completing our evaluation,  we may offer recommendations, but we no longer take patients for medication management. The prescribing physician will ultimately decide, based on his/her training and level of comfort whether to adopt any of the recommendations, including whether or not to prescribe such medicines.  Membrane stabilizer: To be determined at a later time  Muscle relaxant: To be determined at a later time  NSAID: To be  determined at a later time  Other analgesic(s): To be determined at a later time   Interventional management options: Kelsey Rose was informed that there is no guarantee that she would be a candidate for interventional therapies. The decision will be based on the results of diagnostic studies, as well as Kelsey Rose's risk profile.  Procedure(s) under consideration:  Pending results of ordered studies      Interventional Therapies  Risk Factors  Considerations  Medical Comorbidities:     Planned  Pending:      Under consideration:   Pending   Completed:   None at this time   Therapeutic  Palliative (PRN) options:   None established   Completed by other providers:   Diagnostic left L3-4, L4-5 IA facet joint inj. x1 (01/28/2016) by Windell Norfolk, MD (PMR) (6/10 to 0/10)  Diagnostic/therapeutic left glenohumeral (shoulder) joint inj. x1 (05/19/2021) by Dian Situ, MD       Provider-requested follow-up: Return in about 2 weeks (around 06/07/2023) for ( ), Eval-day (M,W), (F2F), 2nd Visit, for review of ordered tests.  Future Appointments  Date Time Provider Department Center  05/25/2023 11:00 AM Huprich, Sharalyn Ink, PT [facility] None  06/08/2023 11:00 AM Huprich, Sharalyn Ink, PT [facility] None  06/09/2023  2:00 PM Delano Metz, MD [facility] None  06/14/2023  1:20 PM Remo Lipps, PA [facility]  06/22/2023  2:30 PM Midge Minium, MD [facility] None  03/08/2024  1:00 PM PC[facility]-ANNUAL WELLNESS VISIT [facility]    Duration of encounter: 79 minutes.  Total time on encounter, as per AMA guidelines included both the face-to-face and non-face-to-face time personally spent by the physician and/or other qualified health care professional(s) on the day of the encounter (includes time in activities that require the physician or other qualified health care professional and does not include time in activities normally performed by clinical staff). Physician's time may include the  following activities when performed: Preparing to see the patient (e.g., pre-charting review of records, searching for previously ordered imaging, lab work, and nerve conduction tests) Review of prior analgesic pharmacotherapies. Reviewing PMP Interpreting ordered tests (e.g., lab work, imaging, nerve conduction tests) Performing post-procedure evaluations, including interpretation of diagnostic procedures Obtaining and/or reviewing separately obtained history Performing a medically appropriate examination and/or evaluation Counseling and educating the patient/family/caregiver Ordering medications, tests, or procedures Referring and communicating with other health care professionals (when not separately reported) Documenting clinical information in the electronic or other health record Independently interpreting results (not separately reported) and communicating results to the patient/ family/caregiver Care coordination (not separately reported)  Note by: Oswaldo Done, MD (TTS technology used. I apologize for any typographical errors that were not detected and corrected.) Date: 05/24/2023; Time: 3:52 PM

## 2023-05-23 NOTE — Patient Instructions (Signed)
____________________________________________________________________________________________  New Patients  Welcome to [facility].   Initial Visit The first or initial visit consists of an evaluation only.   Interventional pain management.  We offer therapies other than opioid controlled substances to manage chronic pain. These include, but are not limited to, diagnostic, therapeutic, and palliative specialized injection therapies (i.e.: Epidural Steroids, Facet Blocks, etc.). We specialize in a variety of nerve blocks as well as radiofrequency treatments. We offer pain implant evaluations and trials, as well as follow up management. In addition we also provide a variety joint injections, including Viscosupplementation (AKA: Gel Therapy).  Prescription Pain Medication. We specialize in alternatives to opioids. We can provide evaluations and recommendations for/of pharmacologic therapies based on CDC Guidelines.  We no longer take patients for long-term medication management. We will not be taking over your pain medications.  ____________________________________________________________________________________________    ____________________________________________________________________________________________  Patient Information update  To: All of our patients.  Re: Name change.  It has been made official that our current name, "[facility]"   will soon be changed to "[facility]".   The purpose of this change is to eliminate any confusion created by the concept of our practice being a "Medication Management Pain Clinic". In the past this has led to the misconception that we treat pain primarily by the use of prescription medications.  Nothing can be farther from the truth.   Understanding PAIN MANAGEMENT: To  further understand what our practice does, you first have to understand that "Pain Management" is a subspecialty that requires additional training once a physician has completed their specialty training, which can be in either Anesthesia, Neurology, Psychiatry, or Physical Medicine and Rehabilitation (PMR). Each one of these contributes to the final approach taken by each physician to the management of their patient's pain. To be a "Pain Management Specialist" you must have first completed one of the specialty trainings below.  Anesthesiologists - trained in clinical pharmacology and interventional techniques such as nerve blockade and regional as well as central neuroanatomy. They are trained to block pain before, during, and after surgical interventions.  Neurologists - trained in the diagnosis and pharmacological treatment of complex neurological conditions, such as Multiple Sclerosis, Parkinson's, spinal cord injuries, and other systemic conditions that may be associated with symptoms that may include but are not limited to pain. They tend to rely primarily on the treatment of chronic pain using prescription medications.  Psychiatrist - trained in conditions affecting the psychosocial wellbeing of patients including but not limited to depression, anxiety, schizophrenia, personality disorders, addiction, and other substance use disorders that may be associated with chronic pain. They tend to rely primarily on the treatment of chronic pain using prescription medications.   Physical Medicine and Rehabilitation (PMR) physicians, also known as physiatrists - trained to treat a wide variety of medical conditions affecting the brain, spinal cord, nerves, bones, joints, ligaments, muscles, and tendons. Their training is primarily aimed at treating patients that have suffered injuries that have caused severe physical impairment. Their training is primarily aimed at the physical therapy and rehabilitation of those  patients. They may also work alongside orthopedic surgeons or neurosurgeons using their expertise in assisting surgical patients to recover after their surgeries.  INTERVENTIONAL PAIN MANAGEMENT is sub-subspecialty of Pain Management.  Our physicians are Board-certified in Anesthesia, Pain Management, and Interventional Pain Management.  This meaning that not only have they been trained   and Board-certified in their specialty of Anesthesia, and subspecialty of Pain Management, but they have also received further training in the sub-subspecialty of Interventional Pain Management, in order to become Board-certified as INTERVENTIONAL PAIN MANAGEMENT SPECIALIST.    Mission: Our goal is to use our skills in  INTERVENTIONAL PAIN MANAGEMENT as alternatives to the chronic use of prescription opioid medications for the treatment of pain. To make this more clear, we have changed our name to reflect what we do and offer. We will continue to offer medication management assessment and recommendations, but we will not be taking over any patient's medication management.  ____________________________________________________________________________________________     

## 2023-05-24 ENCOUNTER — Encounter: Payer: Self-pay | Admitting: Pain Medicine

## 2023-05-24 ENCOUNTER — Ambulatory Visit: Payer: 59 | Attending: Pain Medicine | Admitting: Pain Medicine

## 2023-05-24 ENCOUNTER — Ambulatory Visit
Admission: RE | Admit: 2023-05-24 | Discharge: 2023-05-24 | Disposition: A | Discharge status: Home / Self Care | Payer: 59 | Source: Ambulatory Visit | Attending: Pain Medicine | Admitting: Pain Medicine

## 2023-05-24 VITALS — BP 145/91 | HR 89 | Temp 97.0°F | Resp 18 | Ht 66.0 in | Wt 226.0 lb

## 2023-05-24 DIAGNOSIS — M5137 Other intervertebral disc degeneration, lumbosacral region: Secondary | ICD-10-CM | POA: Insufficient documentation

## 2023-05-24 DIAGNOSIS — R937 Abnormal findings on diagnostic imaging of other parts of musculoskeletal system: Secondary | ICD-10-CM | POA: Insufficient documentation

## 2023-05-24 DIAGNOSIS — M542 Cervicalgia: Secondary | ICD-10-CM | POA: Insufficient documentation

## 2023-05-24 DIAGNOSIS — M25551 Pain in right hip: Secondary | ICD-10-CM | POA: Diagnosis present

## 2023-05-24 DIAGNOSIS — M25511 Pain in right shoulder: Secondary | ICD-10-CM | POA: Insufficient documentation

## 2023-05-24 DIAGNOSIS — M25512 Pain in left shoulder: Secondary | ICD-10-CM | POA: Diagnosis not present

## 2023-05-24 DIAGNOSIS — M899 Disorder of bone, unspecified: Secondary | ICD-10-CM | POA: Insufficient documentation

## 2023-05-24 DIAGNOSIS — M25519 Pain in unspecified shoulder: Secondary | ICD-10-CM | POA: Insufficient documentation

## 2023-05-24 DIAGNOSIS — G894 Chronic pain syndrome: Secondary | ICD-10-CM | POA: Diagnosis not present

## 2023-05-24 DIAGNOSIS — M545 Low back pain, unspecified: Secondary | ICD-10-CM | POA: Diagnosis not present

## 2023-05-24 DIAGNOSIS — M25552 Pain in left hip: Secondary | ICD-10-CM | POA: Insufficient documentation

## 2023-05-24 DIAGNOSIS — Z96611 Presence of right artificial shoulder joint: Secondary | ICD-10-CM | POA: Diagnosis present

## 2023-05-24 DIAGNOSIS — G8929 Other chronic pain: Secondary | ICD-10-CM | POA: Diagnosis present

## 2023-05-24 DIAGNOSIS — Z96619 Presence of unspecified artificial shoulder joint: Secondary | ICD-10-CM | POA: Diagnosis present

## 2023-05-24 DIAGNOSIS — M818 Other osteoporosis without current pathological fracture: Secondary | ICD-10-CM | POA: Insufficient documentation

## 2023-05-24 DIAGNOSIS — Z79899 Other long term (current) drug therapy: Secondary | ICD-10-CM | POA: Insufficient documentation

## 2023-05-24 DIAGNOSIS — M159 Polyosteoarthritis, unspecified: Secondary | ICD-10-CM | POA: Diagnosis present

## 2023-05-24 DIAGNOSIS — Z789 Other specified health status: Secondary | ICD-10-CM | POA: Insufficient documentation

## 2023-05-24 DIAGNOSIS — Z8639 Personal history of other endocrine, nutritional and metabolic disease: Secondary | ICD-10-CM | POA: Insufficient documentation

## 2023-05-24 DIAGNOSIS — M150 Primary generalized (osteo)arthritis: Secondary | ICD-10-CM | POA: Insufficient documentation

## 2023-05-25 ENCOUNTER — Ambulatory Visit: Payer: 59

## 2023-05-25 DIAGNOSIS — M6281 Muscle weakness (generalized): Secondary | ICD-10-CM

## 2023-05-25 DIAGNOSIS — G8929 Other chronic pain: Secondary | ICD-10-CM

## 2023-05-25 DIAGNOSIS — M545 Low back pain, unspecified: Secondary | ICD-10-CM | POA: Diagnosis not present

## 2023-05-25 NOTE — Therapy (Signed)
OUTPATIENT PHYSICAL THERAPY THORACOLUMBAR TREATMENT  Patient Name: Kelsey Rose MRN: [id number] DOB:1960-11-19, 62 y.o., female Today's Date: 05/25/2023  END OF SESSION:  PT End of Session - 05/25/23 1057     Visit Number 5    Number of Visits 17    Date for PT Re-Evaluation 06/03/23    Authorization Type eval: 04/08/23    PT Start Time 1100    PT Stop Time 1145    PT Time Calculation (min) 45 min    Activity Tolerance Patient tolerated treatment well    Behavior During Therapy WFL for tasks assessed/performed            Past Medical History:  Diagnosis Date   Anxiety    Arthritis    Asthma    COPD (chronic obstructive pulmonary disease) (HCC)    Depression    GERD (gastroesophageal reflux disease)    Past Surgical History:  Procedure Laterality Date   ABDOMINAL HYSTERECTOMY  62 yrs old   JOINT REPLACEMENT Right  May 2015   Shoulder replacement   TONSILLECTOMY AND ADENOIDECTOMY  Age 42   Patient Active Problem List   Diagnosis Date Noted   Abnormal MRI, lumbar spine (06/17/2015) 05/24/2023   Chronic hip pain (4th area of Pain) (Bilateral) (L>R) 05/24/2023   Chronic shoulder pain (2ry area of Pain) (Bilateral) (L>R) 05/24/2023   History of total shoulder replacement (Right) 05/24/2023   Osteoarthritis involving multiple joints 05/24/2023   Chronic neck pain (3ry area of Pain) (Midline) 05/24/2023   Chronic pain syndrome 05/23/2023   Pharmacologic therapy 05/23/2023   Disorder of skeletal system 05/23/2023   Problems influencing health status 05/23/2023   Aortic atherosclerosis (HCC) 03/25/2023   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 03/25/2023   Osteoporosis 03/25/2023   Generalized anxiety disorder 02/19/2023   Insomnia due to other mental disorder 02/19/2023   Benign paroxysmal positional vertigo 04/28/2022   Chronic low back pain (1ry area of Pain) (Bilateral) (L>R) w/o sciatica 04/28/2022   Moderate recurrent major depression (HCC) 04/28/2022   History of  vitamin D deficiency 04/28/2022   Chronic diarrhea 02/16/2022   Class 2 severe obesity with serious comorbidity and body mass index (BMI) of 37.0 to 37.9 in adult (HCC) 08/22/2015   DDD (degenerative disc disease), lumbosacral 08/22/2015   GERD (gastroesophageal reflux disease) 04/25/2015   Hyperlipemia 10/30/2014   Chronic obstructive pulmonary disease (HCC) 03/29/2014   Tobacco use disorder, continuous 03/29/2014   Asthma 06/05/2013   Pain in shoulder region after shoulder replacement (Right) 11/18/2012   PCP: Remo Lipps, PA  REFERRING PROVIDER: Remo Lipps, PA  REFERRING DIAG: [id number] (ICD-10-CM) - Pain in joint of left shoulder M54.50,G89.29 (ICD-10-CM) - Chronic bilateral low back pain without sciatica M15.9 (ICD-10-CM) - Primary osteoarthritis involving multiple joints  RATIONALE FOR EVALUATION AND TREATMENT: Rehabilitation  THERAPY DIAG: Chronic left-sided low back pain without sciatica  Muscle weakness (generalized)  ONSET DATE: 03/2015 (approximate);  FOLLOW-UP APPT SCHEDULED WITH REFERRING PROVIDER: Yes, 04/16/23;  FROM INITIAL EVALUATION SUBJECTIVE:  SUBJECTIVE STATEMENT:  Chronic low back pain.   PERTINENT HISTORY:  Pt referred for chronic L sided low back pain. She reports that the pain is from arthritis in her back and occasionally refers into her left hip. Pain for started 9 to 10 years ago. She denies any traumatic onset or back surgeries. She has tried injections but was allergic to the dye. She has also tried physical therapy in the past with some benefit. Most recent lumbar MRI was 06/2015 and showed lower thoracic degenerative disc disease, with advanced changes at T12-L1. L3-4, L4-5, and L5-S1 degenerative disc disease. Mild left neural foraminal narrowing at L3-4 and L5-S1.  She has a history of R shoulder replacement. The symptoms are aggravated by bending and twisting. She has tried Celebrex without benefit. She was prescribed diclofenac but was concerned about possible GIB so never filled prescription. No history of L shoulder surgery but complains of regular left shoulder pain. History of comorbid insomnia and depression. She has an appointment to follow-up with PCP for CPE in the coming months.  03/19/23 CLINICAL DATA:  Chronic osteoarthritis.  Left shoulder pain.   EXAM: LEFT SHOULDER - 2+ VIEW   COMPARISON:  None available   FINDINGS: There is diffuse decreased bone mineralization. Mild glenohumeral joint space narrowing, subchondral sclerosis, and peripheral glenoid degenerative osteophytosis. Moderate-to-large inferior humeral head-neck junction degenerative osteophytes. There are approximate 17 mm and 16 mm ossicles overlying the axillary pouch just medial to the proximal humeral surgical neck, loose bodies.   Mild acromioclavicular joint space narrowing without significant peripheral osteophytosis.   No acute fracture or dislocation.   IMPRESSION: 1. 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 2. Two dominant loose bodies within the axillary pouch.  CLINICAL DATA:  Chronic lower back pain.   EXAM: LUMBAR SPINE - COMPLETE 4+ VIEW   COMPARISON:  None Available.   FINDINGS: There are 5 non-rib-bearing lumbar-type vertebral bodies. Minimal dextrocurvature centered at L2-3. Mild left L2-3 disc space narrowing.   Moderate anterior T12-L1 disc space narrowing and anterior endplate osteophytes. Mild anterior T11-12 disc space narrowing with moderate anterior endplate osteophytes. Minimal posterior L5-S1 disc space narrowing.   Vertebral body heights are maintained.   No pars defect is identified.   Moderate atherosclerotic calcifications.   IMPRESSION: Mild L2-3 and L5-S1 degenerative disc and endplate changes.   Moderate anterior  T12-L1 degenerative disc and endplate changes.   PAIN:    Pain Intensity: Present: 7/10, Best: 4/10, Worst: 8/10 Pain location: bilateral lower lumbar spine (R side is worse) Pain Quality: throbbing,  Radiating: Yes, bilateral buttocks Numbness/Tingling: No Focal Weakness: No Aggravating factors: bending, twisting, lifting, extended standing, cooking, washing dishes, extended sitting; Easing factors: Lidocaine patches, hot shower, dry needling, home TENS; 24-hour pain behavior: increased pain when first waking but pain stays intense throughout the day; History of prior back injury, pain, surgery, or therapy: Yes Dominant hand: right Imaging: Yes, plain films and MRI (see history);  Red flags: Positive: worsening urinary incontinence; Negative for saddle paresthesia, personal history of cancer, h/o spinal tumors, h/o compression fx, h/o abdominal aneurysm, abdominal pain, chills/fever, night sweats, nausea, vomiting, unrelenting pain, first onset of insidious LBP <20 y/o  PRECAUTIONS: None  WEIGHT BEARING RESTRICTIONS: No  FALLS: Has patient fallen in last 6 months? No  Living Environment Lives with:  Lives with a friend Lives in: House/apartment Stairs: 2 steps to enter, 1 step inside;  Prior level of function: Independent  Occupational demands: On disability  Hobbies: Walk dog, play on phone, watch TV  Patient Goals: reduce/eliminate pain, be more active   OBJECTIVE:  Patient Surveys  Modified Oswestry:  FOTO: 38, predicted improvement to 94;   Cognition Patient is oriented to person, place, and time.  Recent memory is intact.  Remote memory is intact.  Attention span and concentration are intact.  Expressive speech is intact.  Patient's fund of knowledge is within normal limits for educational level.    Gross Musculoskeletal Assessment Tremor: None Bulk: Normal Tone: Normal No visible step-off along spinal column, no signs of scoliosis  GAIT: Decreased  self-selected gait speed. Gait appears antalgic;  Posture: Lumbar lordosis: Decreased normal lordosis Iliac crest height: Equal bilaterally Lumbar lateral shift: Negative  AROM AROM (Normal range in degrees) AROM   Lumbar   Flexion (65) Mild loss*  Extension (30) Mod loss*  Right lateral flexion (25) Mod loss*  Left lateral flexion (25) Severe loss*  Right rotation (30) Mild loss  Left rotation (30) Mild loss  (* = pain; Blank rows = not tested)  LE MMT: MMT (out of 5) Right  Left   Hip flexion 4+* 4+*  Hip extension    Hip abduction (seated) 5 5  Hip adduction (seated) 5 5  Hip internal rotation    Hip external rotation    Knee flexion 5 5  Knee extension 5 5  Ankle dorsiflexion 5 5  Ankle plantarflexion Strong Strong  (* = pain; Blank rows = not tested)  Sensation Grossly intact to light touch throughout bilateral LEs as determined by testing dermatomes L2-S2. Proprioception, stereognosis, and hot/cold testing deferred on this date.  Reflexes Deferred  Muscle Length Hamstrings: R: Not examined L: Not examined Ely (quadriceps): R: Not examined L: Not examined Thomas (hip flexors): R: Not examined L: Not examined Ober: R: Not examined L: Not examined  Palpation Location Right Left         Lumbar paraspinals 1 1  Quadratus Lumborum 1 1  Gluteus Maximus    Gluteus Medius    Deep hip external rotators    PSIS    Fortin's Area (SIJ)    Greater Trochanter    (Blank rows = not tested) Graded on 0-4 scale (0 = no pain, 1 = pain, 2 = pain with wincing/grimacing/flinching, 3 = pain with withdrawal, 4 = unwilling to allow palpation)  Passive Accessory Intervertebral Motion Deferred  Special Tests Lumbar Radiculopathy and Discogenic: Centralization and Peripheralization (SN 92, -LR 0.12): Not examined Slump (SN 83, -LR 0.32): R: Not examined L: Not examined SLR (SN 92, -LR 0.29): R: Not examined L:  Not examined Crossed SLR (SP 90): R: Not examined L: Not  examined  Facet Joint: Extension-Rotation (SN 100, -LR 0.0): R: Not examined L: Not examined  Lumbar Foraminal Stenosis: Lumbar quadrant (SN 70): R: Not examined L: Not examined  Hip: FABER (SN 81): R: Not examined L: Not examined FADIR (SN 94): R: Not examined L: Not examined Hip scour (SN 50): R: Not examined L: Not examined  SIJ:  Thigh Thrust (SN 88, -LR 0.18) : R: Not examined L: Not examined  Piriformis Syndrome: FAIR Test (SN 88, SP 83): R: Not examined L: Not examined  Functional Tasks Deferred  Beighton scale: Deferred   TODAY'S TREATMENT:   SUBJECTIVE: Pt reports that she is doing alright . She reports ongoing chronic left low back and had her consult with pain management yesterday. No specific questions or concerns currently.    PAIN: 4/10 left low back and left hip pain;   Manual  Therapy  STM to bilateral lumbar paraspinals and quadratus lumborum with effleurage and hypervolt x 8 minutes; Supine L sciatic nerve glides with L ankle DF/PF;   Ther-ex: 40 mins NuStep L2-4 x 10 minutes for BLE strengthening and warm-up during interval history (5 minutes unbilled); Prone hip extension x 10 BLE; Hooklying lumbar rotation rocking 2 x 60s each direction; Hooklying alternating BLE marches 2 x 10 BLE; Hooklying clams with manual resistance from therapist 2 x 10 BLE; Hooklying adductor squeezes with manual resistance from therapist 2 x 10 BLE; Hooklying bridges x 10;   PATIENT EDUCATION:  Education details: Plan of care Person educated: Patient Education method: Explanation Education comprehension: verbalized understanding   HOME EXERCISE PROGRAM:  None currently   ASSESSMENT:  CLINICAL IMPRESSION: Pt demonstrates excellent participation with therapy today. Progressed strengthening and utilized STM for tissue extensibility and pain control. Pt encouraged to follow-up as scheduled. Plan to progress strengthening and utilize manual therapy as needed for  pain management. Pt will benefit from PT services to address deficits in strength, ROM, and pain in order to return to full function at home.   OBJECTIVE IMPAIRMENTS: Abnormal gait, decreased activity tolerance, decreased ROM, decreased strength, obesity, and pain.   ACTIVITY LIMITATIONS: lifting, bending, sitting, standing, and squatting  PARTICIPATION LIMITATIONS: meal prep, cleaning, laundry, driving, shopping, and community activity  PERSONAL FACTORS: Behavior pattern, Past/current experiences, Time since onset of injury/illness/exacerbation, and 3+ comorbidities: GAD, depression, BPPV, OA, obesity, and insomnia  are also affecting patient's functional outcome.   REHAB POTENTIAL: Fair    CLINICAL DECISION MAKING: Unstable/unpredictable  EVALUATION COMPLEXITY: High   GOALS: Goals reviewed with patient? No  SHORT TERM GOALS: Target date: 05/06/2023  Pt will be independent with HEP in order to improve strength and decrease back pain to improve pain-free function at home and work. Baseline:  Goal status: INITIAL   LONG TERM GOALS: Target date: 06/03/2023  Pt will increase FOTO to at least 45 to demonstrate significant improvement in function at home and work related to back pain  Baseline: 04/08/23: 38; Goal status: INITIAL  2.  Pt will decrease worst back pain by at least 2 points on the NPRS in order to demonstrate clinically significant reduction in back pain. Baseline: 04/08/23: worst: 8/10; Goal status: INITIAL  3.  Pt will decrease mODI score by at least 13 points in order demonstrate clinically significant reduction in back pain/disability.       Baseline: 04/08/23: To be completed Goal status: INITIAL   PLAN: PT FREQUENCY: 1-2x/week  PT DURATION: 8 weeks  PLANNED INTERVENTIONS: Therapeutic exercises, Therapeutic activity, Neuromuscular re-education, Balance training, Gait training, Patient/Family education, Self Care, Joint mobilization, Joint manipulation,  Vestibular training, Canalith repositioning, Orthotic/Fit training, DME instructions, Dry Needling, Electrical stimulation, Spinal manipulation, Spinal mobilization, Cryotherapy, Moist heat, Taping, Traction, Ultrasound, Ionotophoresis 4mg /ml Dexamethasone, Manual therapy, and Re-evaluation.  PLAN FOR NEXT SESSION: neural tension testing, PAIVM testing, manual techniques and TENS for pain control, progressive lumbar stabilization program;   Lynnea Maizes PT, DPT, GCS  12:15 PM,05/25/23

## 2023-05-26 ENCOUNTER — Ambulatory Visit: Payer: 59 | Admitting: Physician Assistant

## 2023-05-27 ENCOUNTER — Encounter: Payer: Self-pay | Admitting: Pain Medicine

## 2023-05-27 LAB — COMPLIANCE DRUG ANALYSIS, UR

## 2023-05-30 LAB — 25-HYDROXY VITAMIN D LCMS D2+D3
25-Hydroxy, Vitamin D-2: <1 ng/mL
25-Hydroxy, Vitamin D-3: 34 ng/mL
25-Hydroxy, Vitamin D: 34 ng/mL

## 2023-05-30 LAB — COMP. METABOLIC PANEL (12)
AST: 14 IU/L (ref 0–40)
Albumin: 4 g/dL (ref 3.8–4.9)
Alkaline Phosphatase: 121 IU/L (ref 44–121)
BUN/Creatinine Ratio: 8 — ABNORMAL LOW (ref 12–28)
BUN: 6 mg/dL — ABNORMAL LOW (ref 8–27)
Bilirubin Total: <0.2 mg/dL (ref 0.0–1.2)
Calcium: 9.5 mg/dL (ref 8.7–10.3)
Chloride: 105 mmol/L (ref 96–106)
Creatinine, Ser: 0.77 mg/dL (ref 0.57–1.00)
Globulin, Total: 3 g/dL (ref 1.5–4.5)
Glucose: 112 mg/dL — ABNORMAL HIGH (ref 70–99)
Potassium: 4.4 mmol/L (ref 3.5–5.2)
Sodium: 144 mmol/L (ref 134–144)
Total Protein: 7 g/dL (ref 6.0–8.5)
eGFR: 88 mL/min/{1.73_m2} (ref >59)

## 2023-05-30 LAB — C-REACTIVE PROTEIN: CRP: 1 mg/L (ref 0–10)

## 2023-05-30 LAB — MAGNESIUM: Magnesium: 1.9 mg/dL (ref 1.6–2.3)

## 2023-05-30 LAB — VITAMIN B12: Vitamin B-12: 249 pg/mL (ref 232–1245)

## 2023-05-30 LAB — SEDIMENTATION RATE: Sed Rate: 24 mm/hr (ref 0–40)

## 2023-06-01 ENCOUNTER — Other Ambulatory Visit (HOSPITAL_BASED_OUTPATIENT_CLINIC_OR_DEPARTMENT_OTHER): Payer: 59 | Admitting: Pain Medicine

## 2023-06-01 DIAGNOSIS — R937 Abnormal findings on diagnostic imaging of other parts of musculoskeletal system: Secondary | ICD-10-CM | POA: Insufficient documentation

## 2023-06-01 NOTE — Progress Notes (Signed)
Incidental finding of a possible nodular opacity versus summation artifact from costomanubrial calcification of the LEFT apex, on diagnostic cervical spine x-rays with flexion and extension views.  Radiology has recommended 2 view diagnostic x-rays of the chest to further evaluate the area.  X-rays have been ordered.  A copy of the diagnostic cervical spine x-rays were sent to the patient's primary care provider, Tillie Fantasia, PA-C for follow-up.  Staff notified to arrange for diagnostic x-ray.

## 2023-06-02 ENCOUNTER — Ambulatory Visit
Admission: RE | Admit: 2023-06-02 | Discharge: 2023-06-02 | Disposition: A | Discharge status: Home / Self Care | Payer: 59 | Attending: Pain Medicine | Admitting: Pain Medicine

## 2023-06-02 ENCOUNTER — Ambulatory Visit
Admission: RE | Admit: 2023-06-02 | Discharge: 2023-06-02 | Disposition: A | Discharge status: Home / Self Care | Payer: 59 | Source: Ambulatory Visit | Attending: Pain Medicine | Admitting: Pain Medicine

## 2023-06-02 DIAGNOSIS — R937 Abnormal findings on diagnostic imaging of other parts of musculoskeletal system: Secondary | ICD-10-CM | POA: Insufficient documentation

## 2023-06-07 NOTE — Progress Notes (Signed)
PROVIDER NOTE: Information contained herein reflects review and annotations entered in association with encounter. Interpretation of such information and data should be left to medically-trained personnel. Information provided to patient can be located elsewhere in the medical record under "Patient Instructions". Document created using STT-dictation technology, any transcriptional errors that may result from process are unintentional.    Patient: Kelsey Rose  Service Category: E/M  Provider: Oswaldo Done, MD  DOB: 07-28-1961  DOS: 06/09/2023  Referring Provider: Remo Lipps, PA  MRN: [id number]  Specialty: Interventional Pain Management  PCP: Remo Lipps, PA  Type: Established Patient  Setting: Ambulatory outpatient    Location: Office  Delivery: Face-to-face     Primary Reason(s) for Visit: Encounter for evaluation before starting new chronic pain management plan of care (Level of risk: moderate) CC: Leg Pain (Right upper outer thigh)  HPI  Kelsey Rose is a 62 y.o. year old, female patient, who comes today for a follow-up evaluation to review the test results and decide on a treatment plan. She has Chronic obstructive pulmonary disease (HCC); Tobacco use disorder, continuous; Class 2 severe obesity with serious comorbidity and body mass index (BMI) of 37.0 to 37.9 in adult [facility]); DDD (degenerative disc disease), lumbosacral; GERD (gastroesophageal reflux disease); Hyperlipemia; Chronic diarrhea; Benign paroxysmal positional vertigo; Asthma; Chronic low back pain (1ry area of Pain) (Bilateral) (L>R) w/o sciatica; Moderate recurrent major depression (HCC); History of vitamin D deficiency; Pain in shoulder region after shoulder replacement (Right); Generalized anxiety disorder; Insomnia due to other mental disorder; Aortic atherosclerosis (HCC); 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; Osteoporosis; Chronic pain syndrome; Pharmacologic therapy; Disorder of skeletal system; Problems influencing  health status; Abnormal MRI, lumbar spine (06/17/2015); Chronic hip pain (4th area of Pain) (Bilateral) (L>R); Chronic shoulder pain (2ry area of Pain) (Bilateral) (L>R); History of total shoulder replacement (Right); Osteoarthritis involving multiple joints; Chronic neck pain (3ry area of Pain) (Midline); Abnormal x-ray of cervical spine; T12 wedge compression fracture, sequela; DDD (degenerative disc disease), cervical; COPD with chronic bronchitis; Osteoarthritis of hips (Bilateral); Cervical foraminal stenosis (Right: CC4-5, C5-6, C6-7, C7-T1); Cervical facet hypertrophy; Cervical facet arthropathy; Cervical facet joint pain; Osteophytosis of shoulder (Left); Osteoarthritis of AC (acromioclavicular) joint (Left); Lumbar facet arthropathy; Lumbar facet joint pain; Osteopenia determined by x-ray; Osteopenia of hips (Bilateral); Ligamentum flavum hypertrophy (L3-4, L4-5, L5-S1); DDD (degenerative disc disease), thoracic; Lumbar intervertebral disc protrusion (Left: L3-4, L4-5); and Lumbar foraminal stenosis (Left: L3-4, L5-S1) on their problem list. Her primarily concern today is the Leg Pain (Right upper outer thigh)  Pain Assessment: Location: Right Leg Radiating: denies Onset: More than a month ago Duration: Chronic pain Quality: Discomfort Severity: 5 /10 (subjective, self-reported pain score)  Effect on ADL: Limtis activities Timing: Intermittent Modifying factors: nothing BP: (!) 130/97  HR: 81  Kelsey Rose comes in today for a follow-up visit after her initial evaluation on 05/24/2023. Today we went over the results of her tests. These were explained in "Layman's terms". During today's appointment we went over my diagnostic impression, as well as the proposed treatment plan.  Review of initial evaluation (05/24/2023): "According to the patient the primary area of pain is that of the lower back (Bilateral) (L>R).  The patient denies any prior surgeries but she does admit to having had some  x-rays of the lumbar spine.  Review of those x-rays show no flexion and extension views.  The patient also has an MRI of the lumbar spine but this was done on 06/17/2015.  That MRI demonstrate  lower thoracic degenerative disc disease with advanced changes at the T12-L1 level.  In addition there is evidence of degenerative disc disease affecting the L3-4, L4-5, and L5-S1 levels.  The MRI also describes a left neuroforaminal narrowing at L3-4 and L5-S1.  At the L3-4 level there is a diffuse disc bulge with a superimposed left foraminal disc protrusion, ligamentum flavum hypertrophy, and mild facet arthropathy with mild left foraminal narrowing.  At the L4-5 level there is a diffuse disc bulge with ligamentum flavum hypertrophy, mild facet arthropathy, and a tiny superimposed left foraminal disc protrusion.  At the L5-S1 level there is diffuse disc bulge, ligamentum flavum hypertrophy, and mild facet arthropathy resulting in mild left neural foraminal narrowing.  These changes were present on 2016 and likely to have worsened since.  She describes having had multiple referrals to physical therapy where she has gone twice per week x 4 weeks, with fairly good results unfortunately they do not last but approximately 1 day.  She is currently undergoing further physical therapy.  When asked about prior nerve blocks she indicated having had an attempt at having some nerve blocks done in [place] around 2017.  She describes that the plan was to do 3 levels but on the second level she started feeling lightheaded, nauseous, and experiencing cold sweats and hyperventilation.  At the time the procedure was being done without any type of IV sedation and apparently minimal monitoring since I asked her about her heart rate and blood pressure going down and she indicated that they did not know since they were not checking on that.  She states that on that second level they stopped the procedure and it was not repeated.  She was  told that she was "allergic to iodine contrast".  Review of the electronic medical record reveals that on 01/28/2016 the patient underwent a left-sided L3/L4 "or" L4/L5 facet joint injection under fluoroscopy.  According to the note they did inject a small amount of contrast into the facet joints.  However they indicate that the patient did not have any type of side effects or adverse reactions and no complications to the procedure.  According to the information, it was billed as a 3 level procedure.  Preprocedure pain score was documented as a 6/10 and the postprocedure score has a 0/10.  According to the note the "postprocedure exam" showed marked reduction in pain with functional improvement in the lumbar spine range of motion with ability to extend pain-free with no new neuromotor compromise.  The procedure was apparently done by Windell Norfolk, MD [facility]).    Note: Description of the patient's event would suggest that she had a vasovagal reaction when having the facet joint injections.   The patient has secondary area pain at site of the shoulders (Bilateral) (L>R).  The patient does have a history of a right shoulder total arthroplasty.  Unfortunately she still having pain in that shoulder.  She admits to having had surgery for the shoulder pain but she describes that it did not help.  She describes having had some x-rays of the shoulder in the past 2 years and having had a joint injection around 2019 which did not last.  She also vaguely remembers having had a nerve block in the area of the neck for her right shoulder.  The patient denies any other shoulder surgeries or arthroscopies.  She does admit to having had a recent left shoulder x-ray which is available to [place] and we have  provided her with a copy of that x-ray.  In addition, she also had a joint injection done in 2019 which according to her did not last long.  Review of the electronic medical record reveals the patient to have had a  Diagnostic/therapeutic left glenohumeral (shoulder) joint inj. x1 (05/19/2021) by Dian Situ, MD. [facility]).   The patient's third area pain is that of the neck (posterior aspect) (Midline).  She denies any prior surgeries, recent x-rays, nerve blocks, or physical therapy for the neck pain.  Other than the shoulder pain, she denies any upper extremity pain, numbness, or weakness suggestive of any radiculopathies.   The patient's fourth area pain is that of the lower extremities (Bilateral) (L>R).  She describes the pain in the left lower extremity as going down the lateral aspect of the upper thigh down to the knee, but no abnormal signs or symptoms below the knee.  Specifically she denies any pain, numbness, or weakness.  She describes that the left lower extremity pain has been intermittent and throbbing.  She describes that it runs through the lateral aspect of the thigh, straight down, with no radicular pattern.  In terms of the right lower extremity pain she describes that it is identical to the left, but less frequent.  Pain pattern seems to follow that off hip joint arthralgia.   Pharmacotherapy: The patient indicates not taking any medication for the pain.    Other analgesic therapies: She indicates the morning and frequently using a TENS unit.   Physical exam: The patient was able to toe walk and heel walk without any major problems.  She does have some instability that may come from deconditioning.  She is morbidly obese with a truncal obesity pattern.  BMI is 36.48 kg/m.  Lumbar spine flexion maneuver demonstrated relatively normal range of motion for the patient indicated that it felt like a good stretch.  She did experience some discomfort across the lower back during the recovery portion of the maneuver.  Lumbar spine hyperextension showed decreased range of motion with pain across the lumbar spine.  Lumbar spine hyperextension and rotation also demonstrated decreased range  of motion worse towards the right side with reproduction of the patient's low back pain.  Maneuver seem to be compatible with bilateral lumbar facet arthralgia and results of Kemp maneuver (concordant).  Patrick maneuver was negative bilaterally for sacroiliac joint pain but positive bilaterally for hip joint pain on active and of range of motion.  On the left side this trigger pain referred towards the left buttocks area where she had previously complained of having some of her usual pain.   Medical comorbidities: Morbid obesity (36.48 kg/m BMI); current smoker with COPD and bronchial asthma; GERD; generalized anxiety disorder and history of depression.  DEXA scan with evidence of osteoporosis."  Review of tests ordered on 05/24/2023: Lab work was within normal limits with the exception of the comprehensive metabolic panel which showed mildly elevated blood glucose level of 112 (normal 70-99 mg/dL) with decreased levels of BUN and the BUNs/creatinine ratio.  Diagnostic x-rays of the cervical spine with flexion views demonstrated mild degenerative changes of the cervical spine most prominent in the lower cervical area.  In addition there was a possible nodular opacity vs summation artifact of the left lung apex.  Diagnostic x-rays of the right hip showed minimal degenerative changes of both hips.  Diagnostic x-rays of the left hip also demonstrated minimal degenerative changes.  Diagnostic x-rays of the lumbar spine with  bending views showed a mildly increased wedging of the T12 vertebral body in comparison to prior studies.  In addition there was mild multilevel degenerative changes with disc height loss at L3-4 and T12-L1.  Diagnostic x-rays of the right shoulder indicated status post right shoulder arthroplasty with a small amount of lucency abutting the tip of the humeral component.  This likely reflects chronic loosening but it appears grossly similar to prior x-rays done on 2018.  Humeral component  appears mildly high ridging in comparison to the glenoid component.  This could reflect a degree of wearing of the glenoid component vs artifact from positioning vs rotator cuff pathology.  Note: Radiology recommended ordering a 2 view chest radiograph to evaluate possible nodular opacity in the left lung apex.  These x-rays were ordered which clarified the observed anomaly.  The impression of the x-rays indicated no acute abnormality.  No nodular opacity or summation density seen on x-rays.  However date did observe COPD and chronic bronchitis changes.  Today we went over all of the above information including recommendations for the treatment of her osteoporosis and the importance of doing this whenever getting steroid injections.  Patient presented with interventional treatment options. Ms. Tubman was informed that I will not be providing medication management. Pharmacotherapy evaluation including recommendations may be offered, if specifically requested.   The patient has indicated that right now her low back pain is bad enough that it needs some type of treatment to see if we can get it better.  Based on the above information, it is my opinion that her low back pain is secondary to facet joint disease.  I will be scheduling her for diagnostic bilateral lumbar facet medial branch blocks.  If she responds well, she may be a candidate for radiofrequency ablation.  She is currently on physical therapy for this pain and she refers that she has had physical therapy and multiple other locations for the same reason.  Her last MRI appears to have been done on 06/17/2015 and by then she was already having problems with her lumbar spine.  In the future, we may repeat this MRI to see the current status of her degenerative joint and disc disease.  The above information and plan were shared with the patient who understood and accepted.  Controlled Substance Pharmacotherapy Assessment REMS (Risk Evaluation and  Mitigation Strategy)  Opioid Analgesic: None MME/day: 0 mg/day  Pill Count: None expected due to no prior prescriptions written by our practice. Valerie Salts, RN  06/09/2023  1:38 PM  Sign when Signing Visit Safety precautions to be maintained throughout the outpatient stay will include: orient to surroundings, keep bed in low position, maintain call bell within reach at all times, provide assistance with transfer out of bed and ambulation.   Pharmacokinetics: Liberation and absorption (onset of action): WNL Distribution (time to peak effect): WNL Metabolism and excretion (duration of action): WNL         Pharmacodynamics: Desired effects: Analgesia: Ms. Kibe reports >50% benefit. Functional ability: Patient reports that medication allows her to accomplish basic ADLs Clinically meaningful improvement in function (CMIF): Sustained CMIF goals met Perceived effectiveness: Described as relatively effective, allowing for increase in activities of daily living (ADL) Undesirable effects: Side-effects or Adverse reactions: None reported Monitoring:  PMP: PDMP reviewed during this encounter. Online review of the past 60-month period previously conducted. Not applicable at this point since we have not taken over the patient's medication management yet. List of other Serum/Urine Drug Screening  Test(s):  No results found for: "AMPHSCRSER", "BARBSCRSER", "BENZOSCRSER", "COCAINSCRSER", "COCAINSCRNUR", "PCPSCRSER", "THCSCRSER", "THCU", "CANNABQUANT", "OPIATESCRSER", "OXYSCRSER", "PROPOXSCRSER", "ETH", "CBDTHCR", "D8THCCBX", "D9THCCBX" List of all UDS test(s) done:  Lab Results  Component Value Date   SUMMARY Note 05/24/2023   Last UDS on record: Summary  Date Value Ref Range Status  05/24/2023 Note  Final    Comment:    ==================================================================== Compliance Drug Analysis, Ur ==================================================================== Test                              Result       Flag       Units  Drug Present and Declared for Prescription Verification   Citalopram                     PRESENT      EXPECTED   Desmethylcitalopram            PRESENT      EXPECTED    Desmethylcitalopram is an expected metabolite of citalopram or the    enantiomeric form, escitalopram.  Drug Present not Declared for Prescription Verification   Mirtazapine                    PRESENT      UNEXPECTED  Drug Absent but Declared for Prescription Verification   Bupropion                      Not Detected UNEXPECTED   Lidocaine                      Not Detected UNEXPECTED    Lidocaine, as indicated in the declared medication list, is not    always detected even when used as directed.    Dextromethorphan               Not Detected UNEXPECTED ==================================================================== Test                      Result    Flag   Units      Ref Range   Creatinine              103              mg/dL      >=16 ==================================================================== Declared Medications:  The flagging and interpretation on this report are based on the  following declared medications.  Unexpected results may arise from  inaccuracies in the declared medications.   **Note: The testing scope of this panel includes these medications:   Bupropion (Auvelity)  Citalopram (Celexa)  Dextromethorphan (Auvelity)   **Note: The testing scope of this panel does not include small to  moderate amounts of these reported medications:   Topical Lidocaine (Lidoderm)   **Note: The testing scope of this panel does not include the  following reported medications:   Albuterol (Ventolin HFA)  Albuterol (Airsupra)  Alendronate (Fosamax)  Atorvastatin (Lipitor)  Bismuth subsalicylate (Pepto-bismol)  Budesonide (Airsupra)  Loperamide (Imodium)  Nystatin (Mycostatin)  Olodaterol (Stiolto Respimat)  Ondansetron (Zofran)  Tiotropium  (Stiolto Respimat) ==================================================================== For clinical consultation, please call [phone]. ====================================================================    UDS interpretation: No unexpected findings.          Medication Assessment Form: Not applicable. No opioids. Treatment compliance: Not applicable Risk Assessment Profile: Aberrant behavior: See initial evaluations. None observed or detected today Comorbid factors increasing risk  of overdose: See initial evaluation. No additional risks detected today Opioid risk tool (ORT):     05/24/2023    1:06 PM  Opioid Risk   Alcohol 1  Illegal Drugs 0  Rx Drugs 4  Alcohol 0  Illegal Drugs 0  Rx Drugs 0  Age between 16-45 years  0  History of Preadolescent Sexual Abuse 0  Psychological Disease 0  Depression 1  Opioid Risk Tool Scoring 6  Opioid Risk Interpretation Moderate Risk    ORT Scoring interpretation table:  Score <3 = Low Risk for SUD  Score between 4-7 = Moderate Risk for SUD  Score >8 = High Risk for Opioid Abuse   Risk of substance use disorder (SUD): Low  Risk Mitigation Strategies:  Patient opioid safety counseling: No controlled substances prescribed. Patient-Prescriber Agreement (PPA): No agreement signed.  Controlled substance notification to other providers: None required. No opioid therapy.  Pharmacologic Plan: Non-opioid analgesic therapy offered. Interventional alternatives discussed.             Laboratory Chemistry Profile   Renal Lab Results  Component Value Date   BUN 6 (L) 05/24/2023   CREATININE 0.77 05/24/2023   BCR 8 (L) 05/24/2023     Electrolytes Lab Results  Component Value Date   NA 144 05/24/2023   K 4.4 05/24/2023   CL 105 05/24/2023   CALCIUM 9.5 05/24/2023   MG 1.9 05/24/2023     Hepatic Lab Results  Component Value Date   AST 14 05/24/2023   ALT 13 01/19/2023   ALBUMIN 4.0 05/24/2023   ALKPHOS 121 05/24/2023      ID No results found for: "LYMEIGGIGMAB", "HIV", "SARSCOV2NAA", "STAPHAUREUS", "MRSAPCR", "HCVAB", "PREGTESTUR", "RMSFIGG", "QFVRPH1IGG", "QFVRPH2IGG"   Bone Lab Results  Component Value Date   VD25OH 37.7 01/19/2023   25OHVITD1 34 05/24/2023   25OHVITD2 <1.0 05/24/2023   25OHVITD3 34 05/24/2023     Endocrine Lab Results  Component Value Date   GLUCOSE 112 (H) 05/24/2023   HGBA1C 5.5 12/20/2014   TSH 1.810 01/19/2023     Neuropathy Lab Results  Component Value Date   VITAMINB12 249 05/24/2023   HGBA1C 5.5 12/20/2014     CNS No results found for: "COLORCSF", "APPEARCSF", "RBCCOUNTCSF", "WBCCSF", "POLYSCSF", "LYMPHSCSF", "EOSCSF", "PROTEINCSF", "GLUCCSF", "JCVIRUS", "CSFOLI", "IGGCSF", "LABACHR", "ACETBL"   Inflammation (CRP: Acute  ESR: Chronic) Lab Results  Component Value Date   CRP 1 05/24/2023   ESRSEDRATE 24 05/24/2023     Rheumatology No results found for: "RF", "ANA", "LABURIC", "URICUR", "LYMEIGGIGMAB", "LYMEABIGMQN", "HLAB27"   Coagulation Lab Results  Component Value Date   PLT 234 01/19/2023     Cardiovascular Lab Results  Component Value Date   HGB 15.3 01/19/2023   HCT 44.5 01/19/2023     Screening No results found for: "SARSCOV2NAA", "COVIDSOURCE", "STAPHAUREUS", "MRSAPCR", "HCVAB", "HIV", "PREGTESTUR"   Cancer No results found for: "CEA", "CA125", "LABCA2"   Allergens No results found for: "ALMOND", "APPLE", "ASPARAGUS", "AVOCADO", "BANANA", "BARLEY", "BASIL", "BAYLEAF", "GREENBEAN", "LIMABEAN", "WHITEBEAN", "BEEFIGE", "REDBEET", "BLUEBERRY", "BROCCOLI", "CABBAGE", "MELON", "CARROT", "CASEIN", "CASHEWNUT", "CAULIFLOWER", "CELERY"     Note: Lab results reviewed.  Recent Diagnostic Imaging Review  Cervical Imaging: Cervical DG Bending/F/E views: Results for orders placed during the hospital encounter of 05/24/23 DG Cervical Spine With Flex & Extend  Narrative CLINICAL DATA:  Cervicalgia  EXAM: CERVICAL SPINE COMPLETE WITH FLEXION AND  EXTENSION VIEWS  COMPARISON:  October 11, 2012.  FINDINGS: The cervical spine is visualized from C1-the superior endplate of C6 on lateral radiograph.  Mild straightening of the cervical lordosis. No evidence of dynamic instability on limited excursion flexion and extension views. Vertebral body heights are maintained: no evidence of acute fracture. Mild intervertebral disc space height loss at C5-6. There is RIGHT-sided osseous neuroforaminal narrowing of the lower cervical spine at C6-7 and C7-T1 secondary to facet arthropathy and uncovertebral hypertrophy. Minimal osseous neuroforaminal narrowing of RIGHT C4-5 and C5-6 secondary to predominately uncovertebral hypertrophy. No prevertebral soft tissue swelling. Possible nodular opacity versus summation artifact from costomanubrial calcification of the LEFT apex.  IMPRESSION: 1. Mild degenerative changes of the cervical spine most pronounced in the lower cervical spine. 2. Possible nodular opacity versus summation artifact from costomanubrial calcification of the LEFT apex. Recommend dedicated two view chest radiograph versus CT chest for further evaluation.  These results will be called to the ordering clinician or representative by the Radiologist Assistant, and communication documented in the PACS or [employer].   Electronically Signed By: Meda Klinefelter M.D. On: 05/31/2023 13:55  Shoulder Imaging: Shoulder-R DG: Results for orders placed during the hospital encounter of 05/24/23 DG Shoulder Right  Narrative CLINICAL DATA:  Right shoulder pain  EXAM: RIGHT SHOULDER - 2+ VIEW  COMPARISON:  January 09, 2017  FINDINGS: Osteopenia. Status post RIGHT shoulder arthroplasty. There is a small amount of lucency abutting the tip of the humeral component. Evaluation for take elation with the glenoid comparing it is limited due to positioning. Humeral component appears mildly high riding in comparison to the glenoid  component. Mild degenerative changes of the acromioclavicular joint. Visualized soft tissues are unremarkable.  IMPRESSION: 1. Status post RIGHT shoulder arthroplasty. Small amount of lucency abutting the tip of the humeral component. This likely reflects chronic loosening and appears grossly similar dating back to prior chest radiograph from 2018. 2. Humeral component appears mildly high riding in comparison to the glenoid component. This could reflect a degree of wearing of the glenoid component versus artifact from positioning versus rotator cuff pathology.   Electronically Signed By: Meda Klinefelter M.D. On: 05/31/2023 13:43  Shoulder-L DG: Results for orders placed in visit on 03/19/23 DG Shoulder Left  Narrative CLINICAL DATA:  Chronic osteoarthritis.  Left shoulder pain.  EXAM: LEFT SHOULDER - 2+ VIEW  COMPARISON:  None available  FINDINGS: There is diffuse decreased bone mineralization. Mild glenohumeral joint space narrowing, subchondral sclerosis, and peripheral glenoid degenerative osteophytosis. Moderate-to-large inferior humeral head-neck junction degenerative osteophytes. There are approximate 17 mm and 16 mm ossicles overlying the axillary pouch just medial to the proximal humeral surgical neck, loose bodies.  Mild acromioclavicular joint space narrowing without significant peripheral osteophytosis.  No acute fracture or dislocation.  IMPRESSION: 1. 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 2. Two dominant loose bodies within the axillary pouch.   Electronically Signed By: Neita Garnet M.D. On: 03/24/2023 15:15  Lumbosacral Imaging: Lumbar DG Bending views: Results for orders placed during the hospital encounter of 05/24/23 DG Lumbar Spine Complete W/Bend  Narrative CLINICAL DATA:  Low back pain  EXAM: LUMBAR SPINE - COMPLETE WITH BENDING VIEWS  COMPARISON:  Mar 19, 2023  FINDINGS: Osteopenia. There are five non-rib bearing  lumbar-type vertebral bodies. There is normal alignment. No evidence of dynamic instability on limited excursion extension and flexion views. Mildly increased wedging of the T12 vertebral body in comparison to prior. Mild intervertebral disc space height loss at L3-4. Moderate intervertebral disc space height loss at T12-L1. Mild lumbar facet arthropathy. Atherosclerotic calcifications.  IMPRESSION: 1. Mildly increased wedging of the T12 vertebral body in comparison  to prior. Recommend correlation with point tenderness. 2. Mild multilevel degenerative changes of the lumbar spine.   Electronically Signed By: Meda Klinefelter M.D. On: 05/31/2023 13:46  Hip Imaging: Hip-R DG 2-3 views: Results for orders placed during the hospital encounter of 05/24/23 DG HIP UNILAT W OR W/O PELVIS 2-3 VIEWS RIGHT  Narrative CLINICAL DATA:  Right hip pain/arthralgia  EXAM: DG HIP (WITH OR WITHOUT PELVIS) 2-3V RIGHT; DG HIP (WITH OR WITHOUT PELVIS) 2-3V LEFT  COMPARISON:  Mar 19, 2023.  FINDINGS: Osteopenia. No acute fracture or dislocation. Minimal degenerative changes of the bilateral hips. No area of erosion or osseous destruction. No unexpected radiopaque foreign body. Atherosclerotic calcifications. Sacrum is obscured by overlapping bowel contents.  IMPRESSION: Minimal degenerative changes of the hips.  If there is a persistent clinical concern for nondisplaced hip or pelvic fracture, recommend dedicated pelvic CT or MRI.   Electronically Signed By: Meda Klinefelter M.D. On: 05/31/2023 13:51  Hip-L DG 2-3 views: Results for orders placed during the hospital encounter of 05/24/23 DG HIP UNILAT W OR W/O PELVIS 2-3 VIEWS LEFT  Narrative CLINICAL DATA:  Right hip pain/arthralgia  EXAM: DG HIP (WITH OR WITHOUT PELVIS) 2-3V RIGHT; DG HIP (WITH OR WITHOUT PELVIS) 2-3V LEFT  COMPARISON:  Mar 19, 2023.  FINDINGS: Osteopenia. No acute fracture or dislocation. Minimal  degenerative changes of the bilateral hips. No area of erosion or osseous destruction. No unexpected radiopaque foreign body. Atherosclerotic calcifications. Sacrum is obscured by overlapping bowel contents.  IMPRESSION: Minimal degenerative changes of the hips.  If there is a persistent clinical concern for nondisplaced hip or pelvic fracture, recommend dedicated pelvic CT or MRI.   Electronically Signed By: Meda Klinefelter M.D. On: 05/31/2023 13:51  Complexity Note: Imaging results reviewed.                         Meds   Current Outpatient Medications:    albuterol (ACCUNEB) 1.25 MG/3ML nebulizer solution, Take 1 ampule by nebulization every 6 (six) hours as needed for wheezing., Disp: , Rfl:    albuterol (PROVENTIL HFA;VENTOLIN HFA) 108 (90 Base) MCG/ACT inhaler, Inhale 2 puffs into the lungs every 6 (six) hours as needed for wheezing or shortness of breath., Disp: 1 Inhaler, Rfl: 2   Albuterol-Budesonide (AIRSUPRA) 90-80 MCG/ACT AERO, Inhale 2 puffs into the lungs every 4 (four) hours as needed (for cough/wheezing)., Disp: 5.9 g, Rfl: 5   alendronate (FOSAMAX) 70 MG tablet, Take 1 tablet (70 mg total) by mouth every 7 (seven) days. Take with a full glass of water on an empty stomach., Disp: 4 tablet, Rfl: 11   atorvastatin (LIPITOR) 20 MG tablet, Take 1 tablet (20 mg total) by mouth daily., Disp: 90 tablet, Rfl: 1   bismuth subsalicylate (PEPTO BISMOL) 262 MG chewable tablet, Chew 524 mg by mouth as needed., Disp: , Rfl:    citalopram (CELEXA) 40 MG tablet, Take 40 mg by mouth daily., Disp: , Rfl:    Dextromethorphan-buPROPion ER (AUVELITY) 45-105 MG TBCR, Take 1 tablet by mouth in the morning and at bedtime. Start with just 1 tab each morning, then increase to every 12h after 3 days, Disp: 60 tablet, Rfl: 2   lidocaine (LIDODERM) 5 %, Place 1 patch onto the skin daily. Remove & Discard patch within 12 hours or as directed by MD, Disp: , Rfl:    loperamide (IMODIUM A-D) 2 MG  tablet, Take 2 mg by mouth 4 (four) times daily as  needed for diarrhea or loose stools., Disp: , Rfl:    mirtazapine (REMERON) 7.5 MG tablet, Take 7.5 mg by mouth at bedtime., Disp: , Rfl:    nystatin powder, Apply 1 Application topically 3 (three) times daily. PRN, Disp: , Rfl:    ondansetron (ZOFRAN) 4 MG tablet, Take 4 mg by mouth every 8 (eight) hours as needed for nausea or vomiting., Disp: , Rfl:    Tiotropium Bromide-Olodaterol (STIOLTO RESPIMAT) 2.5-2.5 MCG/ACT AERS, Inhale 2 each into the lungs daily., Disp: , Rfl:   ROS  Constitutional: Denies any fever or chills Gastrointestinal: No reported hemesis, hematochezia, vomiting, or acute GI distress Musculoskeletal: Denies any acute onset joint swelling, redness, loss of ROM, or weakness Neurological: No reported episodes of acute onset apraxia, aphasia, dysarthria, agnosia, amnesia, paralysis, loss of coordination, or loss of consciousness  Allergies  Ms. Regel is allergic to sulfa antibiotics, iodinated contrast media, and penicillins.  PFSH  Drug: Ms. Tinklenberg  reports that she does not currently use drugs. Alcohol:  reports no history of alcohol use. Tobacco:  reports that she has been smoking cigarettes. She has a 53 pack-year smoking history. She does not have any smokeless tobacco history on file. Medical:  has a past medical history of Anxiety, Arthritis, Asthma, COPD (chronic obstructive pulmonary disease) (HCC), Depression, and GERD (gastroesophageal reflux disease). Surgical: Ms. Theroux  has a past surgical history that includes Tonsillectomy and adenoidectomy (Age 28); Abdominal hysterectomy (62 yrs old); and Joint replacement (Right,  May 2015). Family: family history includes COPD in her sister and sister; Cirrhosis in her father; Dementia in her mother; Depression in her sister and sister; Diabetes in her daughter; Gallstones in her mother; Heart attack in her brother; Osteoarthritis in her mother.  Constitutional Exam   General appearance: Well nourished, well developed, and well hydrated. In no apparent acute distress Vitals:   06/09/23 1332  BP: (!) 130/97  Pulse: 81  Resp: 16  Temp: (!) 97.4 F (36.3 C)  SpO2: 96%  Weight: 236 lb (107 kg)  Height: 5\' 6"  (1.676 m)   BMI Assessment: Estimated body mass index is 38.09 kg/m as calculated from the following:   Height as of this encounter: 5\' 6"  (1.676 m).   Weight as of this encounter: 236 lb (107 kg).  BMI interpretation table: BMI level Category Range association with higher incidence of chronic pain  <18 kg/m2 Underweight   18.5-24.9 kg/m2 Ideal body weight   25-29.9 kg/m2 Overweight Increased incidence by 20%  30-34.9 kg/m2 Obese (Class I) Increased incidence by 68%  35-39.9 kg/m2 Severe obesity (Class II) Increased incidence by 136%  >40 kg/m2 Extreme obesity (Class III) Increased incidence by 254%   Patient's current BMI Ideal Body weight  Body mass index is 38.09 kg/m. Ideal body weight: 59.3 kg (130 lb 11.7 oz) Adjusted ideal body weight: 78.4 kg (172 lb 13.4 oz)   BMI Readings from Last 4 Encounters:  06/09/23 38.09 kg/m  05/24/23 36.48 kg/m  04/14/23 37.93 kg/m  03/19/23 38.25 kg/m   Wt Readings from Last 4 Encounters:  06/09/23 236 lb (107 kg)  05/24/23 226 lb (102.5 kg)  04/14/23 235 lb (106.6 kg)  03/19/23 237 lb (107.5 kg)    Psych/Mental status: Alert, oriented x 3 (person, place, & time)       Eyes: PERLA Respiratory: No evidence of acute respiratory distress  Assessment & Plan  Primary Diagnosis & Pertinent Problem List: The primary encounter diagnosis was Chronic low back pain (1ry area  of Pain) (Bilateral) (L>R) w/o sciatica. Diagnoses of Lumbar facet joint pain, Lumbar facet arthropathy, T12 wedge compression fracture, sequela, DDD (degenerative disc disease), lumbosacral, Lumbar intervertebral disc protrusion (Left: L3-4, L4-5), Ligamentum flavum hypertrophy (L3-4, L4-5, L5-S1), Lumbar foraminal stenosis  (Left: L3-4, L5-S1), Chronic shoulder pain (2ry area of Pain) (Bilateral) (L>R), Osteophytosis of shoulder (Left), Osteoarthritis of AC (acromioclavicular) joint (Left), Chronic neck pain (3ry area of Pain) (Midline), Cervical facet joint pain, Cervical facet hypertrophy, Cervical facet arthropathy, DDD (degenerative disc disease), cervical, Cervical foraminal stenosis (Right: CC4-5, C5-6, C6-7, C7-T1), Chronic hip pain (4th area of Pain) (Bilateral) (L>R), Osteoarthritis of hips (Bilateral), Osteopenia of hips (Bilateral), Osteopenia determined by x-ray, Abnormal MRI, lumbar spine (06/17/2015), DDD (degenerative disc disease), thoracic, COPD with chronic bronchitis, and Age-related osteoporosis without current pathological fracture were also pertinent to this visit.  Visit Diagnosis: 1. Chronic low back pain (1ry area of Pain) (Bilateral) (L>R) w/o sciatica   2. Lumbar facet joint pain   3. Lumbar facet arthropathy   4. T12 wedge compression fracture, sequela   5. DDD (degenerative disc disease), lumbosacral   6. Lumbar intervertebral disc protrusion (Left: L3-4, L4-5)   7. Ligamentum flavum hypertrophy (L3-4, L4-5, L5-S1)   8. Lumbar foraminal stenosis (Left: L3-4, L5-S1)   9. Chronic shoulder pain (2ry area of Pain) (Bilateral) (L>R)   10. Osteophytosis of shoulder (Left)   11. Osteoarthritis of AC (acromioclavicular) joint (Left)   12. Chronic neck pain (3ry area of Pain) (Midline)   13. Cervical facet joint pain   14. Cervical facet hypertrophy   15. Cervical facet arthropathy   16. DDD (degenerative disc disease), cervical   17. Cervical foraminal stenosis (Right: CC4-5, C5-6, C6-7, C7-T1)   18. Chronic hip pain (4th area of Pain) (Bilateral) (L>R)   19. Osteoarthritis of hips (Bilateral)   20. Osteopenia of hips (Bilateral)   21. Osteopenia determined by x-ray   22. Abnormal MRI, lumbar spine (06/17/2015)   23. DDD (degenerative disc disease), thoracic   24. COPD with chronic  bronchitis   25. Age-related osteoporosis without current pathological fracture    Problems updated and reviewed during this visit: Problem  T12 wedge compression fracture, sequela  Ddd (Degenerative Disc Disease), Cervical  Osteoarthritis of hips (Bilateral)  Cervical foraminal stenosis (Right: CC4-5, C5-6, C6-7, C7-T1)  Cervical facet hypertrophy  Cervical facet arthropathy  Cervical facet joint pain  Osteophytosis of shoulder (Left)  Osteoarthritis of AC (acromioclavicular) joint (Left)  Lumbar Facet Arthropathy  Lumbar Facet Joint Pain  Osteopenia Determined By X-Ray  Osteopenia of hips (Bilateral)  Ligamentum flavum hypertrophy (L3-4, L4-5, L5-S1)   (06/17/2015) LUMBAR MRI FINDINGS:  LEVELS: L3-4: ligamentum flavum hypertrophy L4-5: ligamentum flavum hypertrophy L5-S1: ligamentum flavum hypertrophy   Ddd (Degenerative Disc Disease), Thoracic  Lumbar intervertebral disc protrusion (Left: L3-4, L4-5)   (06/17/2015) LUMBAR MRI FINDINGS:  LEVELS: L3-4: left foraminal disc protrusion L4-5: left foraminal disc protrusion.    Lumbar foraminal stenosis (Left: L3-4, L5-S1)   (06/17/2015) LUMBAR MRI FINDINGS:  LEVELS: L3-4: Mild left foraminal narrowing.  L5-S1: mild left neural foraminal narrowing.  IMPRESSION: 1. Mild left neural foraminal narrowing at L3-4 and L5-S1.   Abnormal MRI, lumbar spine (06/17/2015)   (06/17/2015) LUMBAR MRI FINDINGS:  Multilevel disc desiccation present at T12-L1, L3-4, L4-5, and L5-S1.   DISC LEVELS: T11-12: Disc space narrowing with endplate osteophytosis and asymmetric diffuse disc bulge.  T12-L1: Disc space narrowing, endplate osteophytosis, and Modic type II endplate degenerative changes. Diffuse disc bulge. L3-4: Diffuse  disc bulge with superimposed left foraminal disc protrusion, ligamentum flavum hypertrophy, and mild facet arthropathy. Mild left foraminal narrowing.  L4-5: Diffuse disc bulge with ligamentum flavum hypertrophy, mild  facet arthropathy, and tiny superimposed left foraminal disc protrusion.  L5-S1: Diffuse disc bulge, ligamentum flavum hypertrophy, and mild facet arthropathy resulting in mild left neural foraminal narrowing.  Atherosclerosis of the abdominal aorta. Posterior soft tissue edema.  IMPRESSION: 1. Lower thoracic degenerative disc disease, with advanced changes at T12-L1. 2. L3-4, L4-5, and L5-S1 degenerative disc disease. 3. Mild left neural foraminal narrowing at L3-4 and L5-S1.   Copd With Chronic Bronchitis    Plan of Care  Pharmacotherapy (Medications Ordered): No orders of the defined types were placed in this encounter.  Procedure Orders         LUMBAR FACET(MEDIAL BRANCH NERVE BLOCK) MBNB     Lab Orders  No laboratory test(s) ordered today   Imaging Orders  No imaging studies ordered today   Referral Orders  No referral(s) requested today    Pharmacological management:  Opioid Analgesics: I will not be prescribing any opioids at this time Membrane stabilizer: I will not be prescribing any at this time Muscle relaxant: I will not be prescribing any at this time NSAID: I will not be prescribing any at this time Other analgesic(s): I will not be prescribing any at this time      Interventional Therapies  Risk Factors  Considerations  Medical Comorbidities:  COPD  BA  GERD  chronic smoker  paroxysmal vertigo  anxiety  depression     Planned  Pending:   Diagnostic bilateral lumbar facet MBB #1    Under consideration:   Diagnostic bilateral lumbar facet MBB #1  Possible candidate for lumbar facet RFA    Completed:   None at this time   Therapeutic  Palliative (PRN) options:   None established   Completed by other providers:   Diagnostic left L3-4, L4-5 IA facet joint inj. x1 (01/28/2016) by Windell Norfolk, MD (PMR) (6/10 to 0/10)  Diagnostic/therapeutic left glenohumeral (shoulder) joint inj. x1 (05/19/2021) by Dian Situ, MD         Provider-requested follow-up: Return for (ECT): (B) L-FCT BLK #1. Recent Visits Date Type Provider Dept  05/24/23 Office Visit Delano Metz, MD [facility]  Showing recent visits within past 90 days and meeting all other requirements Today's Visits Date Type Provider Dept  06/09/23 Office Visit Delano Metz, MD [facility]  Showing today's visits and meeting all other requirements Future Appointments Date Type Provider Dept  06/29/23 Appointment Delano Metz, MD [facility]  Showing future appointments within next 90 days and meeting all other requirements   Primary Care Physician: Remo Lipps, PA  Duration of encounter: 137 minutes.  Total time on encounter, as per AMA guidelines included both the face-to-face and non-face-to-face time personally spent by the physician and/or other qualified health care professional(s) on the day of the encounter (includes time in activities that require the physician or other qualified health care professional and does not include time in activities normally performed by clinical staff). Physician's time may include the following activities when performed: Preparing to see the patient (e.g., pre-charting review of records, searching for previously ordered imaging, lab work, and nerve conduction tests) Review of prior analgesic pharmacotherapies. Reviewing PMP Interpreting ordered tests (e.g., lab work, imaging, nerve conduction tests) Performing post-procedure evaluations, including interpretation of diagnostic procedures Obtaining and/or reviewing separately obtained history Performing a medically appropriate  examination and/or evaluation Counseling and educating the patient/family/caregiver Ordering medications, tests, or procedures Referring and communicating with other health care professionals (when not separately reported) Documenting clinical information in the electronic or other health  record Independently interpreting results (not separately reported) and communicating results to the patient/ family/caregiver Care coordination (not separately reported)  Note by: Oswaldo Done, MD (TTS technology used. I apologize for any typographical errors that were not detected and corrected.) Date: 06/09/2023; Time: 2:50 PM

## 2023-06-08 ENCOUNTER — Ambulatory Visit: Payer: 59

## 2023-06-08 DIAGNOSIS — M6281 Muscle weakness (generalized): Secondary | ICD-10-CM

## 2023-06-08 DIAGNOSIS — M545 Low back pain, unspecified: Secondary | ICD-10-CM

## 2023-06-09 ENCOUNTER — Encounter: Payer: Self-pay | Admitting: Pain Medicine

## 2023-06-09 ENCOUNTER — Ambulatory Visit: Payer: 59 | Attending: Pain Medicine | Admitting: Pain Medicine

## 2023-06-09 VITALS — BP 130/97 | HR 81 | Temp 97.4°F | Resp 16 | Ht 66.0 in | Wt 236.0 lb

## 2023-06-09 DIAGNOSIS — R937 Abnormal findings on diagnostic imaging of other parts of musculoskeletal system: Secondary | ICD-10-CM | POA: Insufficient documentation

## 2023-06-09 DIAGNOSIS — M5137 Other intervertebral disc degeneration, lumbosacral region: Secondary | ICD-10-CM | POA: Insufficient documentation

## 2023-06-09 DIAGNOSIS — M5134 Other intervertebral disc degeneration, thoracic region: Secondary | ICD-10-CM | POA: Diagnosis present

## 2023-06-09 DIAGNOSIS — S22080S Wedge compression fracture of T11-T12 vertebra, sequela: Secondary | ICD-10-CM | POA: Diagnosis present

## 2023-06-09 DIAGNOSIS — M25551 Pain in right hip: Secondary | ICD-10-CM | POA: Diagnosis present

## 2023-06-09 DIAGNOSIS — M2570 Osteophyte, unspecified joint: Secondary | ICD-10-CM | POA: Diagnosis present

## 2023-06-09 DIAGNOSIS — M539 Dorsopathy, unspecified: Secondary | ICD-10-CM | POA: Insufficient documentation

## 2023-06-09 DIAGNOSIS — J4489 Other specified chronic obstructive pulmonary disease: Secondary | ICD-10-CM | POA: Insufficient documentation

## 2023-06-09 DIAGNOSIS — M2428 Disorder of ligament, vertebrae: Secondary | ICD-10-CM | POA: Insufficient documentation

## 2023-06-09 DIAGNOSIS — M160 Bilateral primary osteoarthritis of hip: Secondary | ICD-10-CM | POA: Diagnosis present

## 2023-06-09 DIAGNOSIS — M5459 Other low back pain: Secondary | ICD-10-CM | POA: Insufficient documentation

## 2023-06-09 DIAGNOSIS — M545 Low back pain, unspecified: Secondary | ICD-10-CM | POA: Insufficient documentation

## 2023-06-09 DIAGNOSIS — M47816 Spondylosis without myelopathy or radiculopathy, lumbar region: Secondary | ICD-10-CM | POA: Insufficient documentation

## 2023-06-09 DIAGNOSIS — M5030 Other cervical disc degeneration, unspecified cervical region: Secondary | ICD-10-CM | POA: Diagnosis present

## 2023-06-09 DIAGNOSIS — M4802 Spinal stenosis, cervical region: Secondary | ICD-10-CM | POA: Insufficient documentation

## 2023-06-09 DIAGNOSIS — M810 Age-related osteoporosis without current pathological fracture: Secondary | ICD-10-CM | POA: Insufficient documentation

## 2023-06-09 DIAGNOSIS — M8580 Other specified disorders of bone density and structure, unspecified site: Secondary | ICD-10-CM | POA: Diagnosis present

## 2023-06-09 DIAGNOSIS — M25552 Pain in left hip: Secondary | ICD-10-CM | POA: Insufficient documentation

## 2023-06-09 DIAGNOSIS — M85851 Other specified disorders of bone density and structure, right thigh: Secondary | ICD-10-CM | POA: Insufficient documentation

## 2023-06-09 DIAGNOSIS — M5126 Other intervertebral disc displacement, lumbar region: Secondary | ICD-10-CM | POA: Diagnosis present

## 2023-06-09 DIAGNOSIS — M542 Cervicalgia: Secondary | ICD-10-CM | POA: Insufficient documentation

## 2023-06-09 DIAGNOSIS — M47812 Spondylosis without myelopathy or radiculopathy, cervical region: Secondary | ICD-10-CM | POA: Insufficient documentation

## 2023-06-09 DIAGNOSIS — M19012 Primary osteoarthritis, left shoulder: Secondary | ICD-10-CM | POA: Insufficient documentation

## 2023-06-09 DIAGNOSIS — M25511 Pain in right shoulder: Secondary | ICD-10-CM | POA: Diagnosis present

## 2023-06-09 DIAGNOSIS — M25512 Pain in left shoulder: Secondary | ICD-10-CM | POA: Insufficient documentation

## 2023-06-09 DIAGNOSIS — M85852 Other specified disorders of bone density and structure, left thigh: Secondary | ICD-10-CM | POA: Diagnosis present

## 2023-06-09 DIAGNOSIS — G8929 Other chronic pain: Secondary | ICD-10-CM | POA: Insufficient documentation

## 2023-06-09 DIAGNOSIS — M48061 Spinal stenosis, lumbar region without neurogenic claudication: Secondary | ICD-10-CM | POA: Diagnosis present

## 2023-06-09 NOTE — Patient Instructions (Signed)
OTC Recommendations: Consider taking over-the-counter supplements such as: Turmeric/curcumin*(anti-inflammatory) Glucosamine/chondroitin (triple strength)*(may help prevent loss of articular cartilage) Vitamin D*(may have the ability to suppress release of chemicals associated with inflammation) Moringa*(anti-inflammatory with mild analgesic effects) (*Always use manufacturer's recommended dosage.)    ______________________________________________________________________  Procedure instructions  Do not eat or drink fluids (other than water) for 6 hours before your procedure  No water for 2 hours before your procedure  Take your blood pressure medicine with a sip of water  Arrive 30 minutes before your appointment  Carefully read the "Preparing for your procedure" detailed instructions  If you have questions call us at [phone]  _____________________________________________________________________    ______________________________________________________________________  Preparing for your procedure  Appointments: If you think you may not be able to keep your appointment, call 24-48 hours in advance to cancel. We need time to make it available to others.  During your procedure appointment there will be: No Prescription Refills. No disability issues to discussed. No medication changes or discussions.  Instructions: Food intake: Avoid eating anything solid for at least 8 hours prior to your procedure. Clear liquid intake: You may take clear liquids such as water up to 2 hours prior to your procedure. (No carbonated drinks. No soda.) Transportation: Unless otherwise stated by your physician, bring a driver. Morning Medicines: Except for blood thinners, take all of your other morning medications with a sip of water. Make sure to take your heart and blood pressure medicines. If your blood pressure's lower number is above 100, the case will be rescheduled. Blood thinners:  Make sure to stop your blood thinners as instructed.  If you take a blood thinner, but were not instructed to stop it, call our office [phone] and ask to talk to a nurse. Not stopping a blood thinner prior to certain procedures could lead to serious complications. Diabetics on insulin: Notify the staff so that you can be scheduled 1st case in the morning. If your diabetes requires high dose insulin, take only  of your normal insulin dose the morning of the procedure and notify the staff that you have done so. Preventing infections: Shower with an antibacterial soap the morning of your procedure.  Build-up your immune system: Take 1000 mg of Vitamin C with every meal (3 times a day) the day prior to your procedure. Antibiotics: Inform the nursing staff if you are taking any antibiotics or if you have any conditions that may require antibiotics prior to procedures. (Example: recent joint implants)   Pregnancy: If you are pregnant make sure to notify the nursing staff. Not doing so may result in injury to the fetus, including death.  Sickness: If you have a cold, fever, or any active infections, call and cancel or reschedule your procedure. Receiving steroids while having an infection may result in complications. Arrival: You must be in the facility at least 30 minutes prior to your scheduled procedure. Tardiness: Your scheduled time is also the cutoff time. If you do not arrive at least 15 minutes prior to your procedure, you will be rescheduled.  Children: Do not bring any children with you. Make arrangements to keep them home. Dress appropriately: There is always a possibility that your clothing may get soiled. Avoid long dresses. Valuables: Do not bring any jewelry or valuables.  Reasons to call and reschedule or cancel your procedure: (Following these recommendations will minimize the risk of a serious complication.) Surgeries: Avoid having procedures within 2 weeks of any surgery. (Avoid for  2 weeks  before or after any surgery). Flu Shots: Avoid having procedures within 2 weeks of a flu shots or . (Avoid for 2 weeks before or after immunizations). Barium: Avoid having a procedure within 7-10 days after having had a radiological study involving the use of radiological contrast. (Myelograms, Barium swallow or enema study). Heart attacks: Avoid any elective procedures or surgeries for the initial 6 months after a "Myocardial Infarction" (Heart Attack). Blood thinners: It is imperative that you stop these medications before procedures. Let us know if you if you take any blood thinner.  Infection: Avoid procedures during or within two weeks of an infection (including chest colds or gastrointestinal problems). Symptoms associated with infections include: Localized redness, fever, chills, night sweats or profuse sweating, burning sensation when voiding, cough, congestion, stuffiness, runny nose, sore throat, diarrhea, nausea, vomiting, cold or Flu symptoms, recent or current infections. It is specially important if the infection is over the area that we intend to treat. Heart and lung problems: Symptoms that may suggest an active cardiopulmonary problem include: cough, chest pain, breathing difficulties or shortness of breath, dizziness, ankle swelling, uncontrolled high or unusually low blood pressure, and/or palpitations. If you are experiencing any of these symptoms, cancel your procedure and contact your primary care physician for an evaluation.  Remember:  Regular Business hours are:  Monday to Thursday 8:00 AM to 4:00 PM  Provider's Schedule: Delano Metz, MD:  Procedure days: Tuesday and Thursday 7:30 AM to 4:00 PM  Edward Jolly, MD:  Procedure days: Monday and Wednesday 7:30 AM to 4:00 PM  ______________________________________________________________________    ____________________________________________________________________________________________  General Risks and  Possible Complications  Patient Responsibilities: It is important that you read this as it is part of your informed consent. It is our duty to inform you of the risks and possible complications associated with treatments offered to you. It is your responsibility as a patient to read this and to ask questions about anything that is not clear or that you believe was not covered in this document.  Patient's Rights: You have the right to refuse treatment. You also have the right to change your mind, even after initially having agreed to have the treatment done. However, under this last option, if you wait until the last second to change your mind, you may be charged for the materials used up to that point.  Introduction: Medicine is not an Visual merchandiser. Everything in Medicine, including the lack of treatment(s), carries the potential for danger, harm, or loss (which is by definition: Risk). In Medicine, a complication is a secondary problem, condition, or disease that can aggravate an already existing one. All treatments carry the risk of possible complications. The fact that a side effects or complications occurs, does not imply that the treatment was conducted incorrectly. It must be clearly understood that these can happen even when everything is done following the highest safety standards.  No treatment: You can choose not to proceed with the proposed treatment alternative. The "PRO(s)" would include: avoiding the risk of complications associated with the therapy. The "CON(s)" would include: not getting any of the treatment benefits. These benefits fall under one of three categories: diagnostic; therapeutic; and/or palliative. Diagnostic benefits include: getting information which can ultimately lead to improvement of the disease or symptom(s). Therapeutic benefits are those associated with the successful treatment of the disease. Finally, palliative benefits are those related to the decrease of the primary  symptoms, without necessarily curing the condition (example: decreasing the pain from a flare-up  of a chronic condition, such as incurable terminal cancer).  General Risks and Complications: These are associated to most interventional treatments. They can occur alone, or in combination. They fall under one of the following six (6) categories: no benefit or worsening of symptoms; bleeding; infection; nerve damage; allergic reactions; and/or death. No benefits or worsening of symptoms: In Medicine there are no guarantees, only probabilities. No healthcare provider can ever guarantee that a medical treatment will work, they can only state the probability that it may. Furthermore, there is always the possibility that the condition may worsen, either directly, or indirectly, as a consequence of the treatment. Bleeding: This is more common if the patient is taking a blood thinner, either prescription or over the counter (example: Goody Powders, Fish oil, Aspirin, Garlic, etc.), or if suffering a condition associated with impaired coagulation (example: Hemophilia, cirrhosis of the liver, low platelet counts, etc.). However, even if you do not have one on these, it can still happen. If you have any of these conditions, or take one of these drugs, make sure to notify your treating physician. Infection: This is more common in patients with a compromised immune system, either due to disease (example: diabetes, cancer, human immunodeficiency virus [HIV], etc.), or due to medications or treatments (example: therapies used to treat cancer and rheumatological diseases). However, even if you do not have one on these, it can still happen. If you have any of these conditions, or take one of these drugs, make sure to notify your treating physician. Nerve Damage: This is more common when the treatment is an invasive one, but it can also happen with the use of medications, such as those used in the treatment of cancer. The damage  can occur to small secondary nerves, or to large primary ones, such as those in the spinal cord and brain. This damage may be temporary or permanent and it may lead to impairments that can range from temporary numbness to permanent paralysis and/or brain death. Allergic Reactions: Any time a substance or material comes in contact with our body, there is the possibility of an allergic reaction. These can range from a mild skin rash (contact dermatitis) to a severe systemic reaction (anaphylactic reaction), which can result in death. Death: In general, any medical intervention can result in death, most of the time due to an unforeseen complication. ____________________________________________________________________________________________

## 2023-06-09 NOTE — Progress Notes (Signed)
Safety precautions to be maintained throughout the outpatient stay will include: orient to surroundings, keep bed in low position, maintain call bell within reach at all times, provide assistance with transfer out of bed and ambulation.  

## 2023-06-14 ENCOUNTER — Encounter: Payer: Self-pay | Admitting: Physician Assistant

## 2023-06-14 ENCOUNTER — Ambulatory Visit (INDEPENDENT_AMBULATORY_CARE_PROVIDER_SITE_OTHER): Payer: 59 | Admitting: Physician Assistant

## 2023-06-14 VITALS — BP 126/84 | HR 92 | Temp 97.6°F | Ht 66.0 in | Wt 235.0 lb

## 2023-06-14 DIAGNOSIS — J449 Chronic obstructive pulmonary disease, unspecified: Secondary | ICD-10-CM

## 2023-06-14 DIAGNOSIS — F17209 Nicotine dependence, unspecified, with unspecified nicotine-induced disorders: Secondary | ICD-10-CM

## 2023-06-14 DIAGNOSIS — F331 Major depressive disorder, recurrent, moderate: Secondary | ICD-10-CM

## 2023-06-14 DIAGNOSIS — G894 Chronic pain syndrome: Secondary | ICD-10-CM

## 2023-06-14 DIAGNOSIS — Z6837 Body mass index (BMI) 37.0-37.9, adult: Secondary | ICD-10-CM

## 2023-06-14 NOTE — Patient Instructions (Signed)
-

## 2023-06-14 NOTE — Progress Notes (Signed)
Date:  06/14/2023   Name:  Kelsey Rose   DOB:  10-Nov-1960   MRN:  [id number]   Chief Complaint: Depression, Back Pain (Still having back pain will be getting nerve block ), and Nicotine Dependence (Still smoking )  HPI Kelsey Rose returns for 1-month follow-up on chronic conditions.  Established with [facility] pain management with plan for nerve block 06/29/2023.  She has a consult for colonoscopy coming up with Dr. Servando Rose 06/22/2023.  Had to cut back on her PT visits due to cost, follows up with physical therapist 06/16/2023.  Unfortunately she has not been able to quit smoking despite our plan last visit.  She took Auvelity for a couple of days, but then forgot what it was for and discontinued  - she still has the unused medication. Roommate quit smoking in Feb 2024 and patient slightly resentful for how easy this seemed for the roommate. Apparently seeing pulmonology in Oct, though I cannot see this appt in Epic  Still struggling with depression, though she has her first grieving class this afternoon at 3:30p.  Anniversary of her daughter's death is 25-Aug-2023  She has once again forgotten what diclofenac is for, so I have reviewed that she can use this for arthritic pain and inflammation.  She has also forgotten what hydroxyzine is for, so I reminded her that this is for as needed use for anxiety.  I wrote on the bottles today.  She continues with mirtazapine which she finds very helpful for sleep, despite likely causing weight gain.   Medication list has been reviewed and updated.  Current Meds  Medication Sig   albuterol (ACCUNEB) 1.25 MG/3ML nebulizer solution Take 1 ampule by nebulization every 6 (six) hours as needed for wheezing.   albuterol (PROVENTIL HFA;VENTOLIN HFA) 108 (90 Base) MCG/ACT inhaler Inhale 2 puffs into the lungs every 6 (six) hours as needed for wheezing or shortness of breath.   alendronate (FOSAMAX) 70 MG tablet Take 1 tablet (70 mg total) by mouth every 7 (seven) days. Take  with a full glass of water on an empty stomach.   atorvastatin (LIPITOR) 20 MG tablet Take 1 tablet (20 mg total) by mouth daily.   bismuth subsalicylate (PEPTO BISMOL) 262 MG chewable tablet Chew 524 mg by mouth as needed.   citalopram (CELEXA) 40 MG tablet Take 40 mg by mouth daily.   lidocaine (LIDODERM) 5 % Place 1 patch onto the skin daily. Remove & Discard patch within 12 hours or as directed by MD   loperamide (IMODIUM A-D) 2 MG tablet Take 2 mg by mouth 4 (four) times daily as needed for diarrhea or loose stools.   mirtazapine (REMERON) 7.5 MG tablet Take 7.5 mg by mouth at bedtime.   naproxen sodium (ALEVE) 220 MG tablet Take 220 mg by mouth as needed.   nystatin powder Apply 1 Application topically 3 (three) times daily. PRN   ondansetron (ZOFRAN) 4 MG tablet Take 4 mg by mouth every 8 (eight) hours as needed for nausea or vomiting.   Tiotropium Bromide-Olodaterol (STIOLTO RESPIMAT) 2.5-2.5 MCG/ACT AERS Inhale 2 each into the lungs daily.   [DISCONTINUED] diclofenac (VOLTAREN) 50 MG EC tablet      Review of Systems  Constitutional:  Positive for fatigue. Negative for fever.  Respiratory:  Negative for chest tightness and shortness of breath.   Cardiovascular:  Negative for chest pain and palpitations.  Gastrointestinal:  Negative for abdominal pain.       Bowel incontinence, one episode  Musculoskeletal:  Positive for arthralgias and back pain.  Psychiatric/Behavioral:  Positive for dysphoric mood and sleep disturbance.     Patient Active Problem List   Diagnosis Date Noted   T12 wedge compression fracture, sequela 06/09/2023   DDD (degenerative disc disease), cervical 06/09/2023   COPD with chronic bronchitis 06/09/2023   Osteoarthritis of hips (Bilateral) 06/09/2023   Cervical foraminal stenosis (Right: CC4-5, C5-6, C6-7, C7-T1) 06/09/2023   Cervical facet hypertrophy 06/09/2023   Cervical facet arthropathy 06/09/2023   Cervical facet joint pain 06/09/2023    Osteophytosis of shoulder (Left) 06/09/2023   Osteoarthritis of AC (acromioclavicular) joint (Left) 06/09/2023   Lumbar facet arthropathy 06/09/2023   Lumbar facet joint pain 06/09/2023   Osteopenia determined by x-ray 06/09/2023   Osteopenia of hips (Bilateral) 06/09/2023   Ligamentum flavum hypertrophy (L3-4, L4-5, L5-S1) 06/09/2023   DDD (degenerative disc disease), thoracic 06/09/2023   Lumbar intervertebral disc protrusion (Left: L3-4, L4-5) 06/09/2023   Lumbar foraminal stenosis (Left: L3-4, L5-S1) 06/09/2023   Abnormal x-ray of cervical spine 06/01/2023   Abnormal MRI, lumbar spine (06/17/2015) 05/24/2023   Chronic hip pain (4th area of Pain) (Bilateral) (L>R) 05/24/2023   Chronic shoulder pain (2ry area of Pain) (Bilateral) (L>R) 05/24/2023   History of total shoulder replacement (Right) 05/24/2023   Osteoarthritis involving multiple joints 05/24/2023   Chronic neck pain (3ry area of Pain) (Midline) 05/24/2023   Chronic pain syndrome 05/23/2023   Pharmacologic therapy 05/23/2023   Disorder of skeletal system 05/23/2023   Problems influencing health status 05/23/2023   Aortic atherosclerosis (HCC) 03/25/2023   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 03/25/2023   Osteoporosis 03/25/2023   Generalized anxiety disorder 02/19/2023   Insomnia due to other mental disorder 02/19/2023   Benign paroxysmal positional vertigo 04/28/2022   Chronic low back pain (1ry area of Pain) (Bilateral) (L>R) w/o sciatica 04/28/2022   Moderate recurrent major depression (HCC) 04/28/2022   History of vitamin D deficiency 04/28/2022   Chronic diarrhea 02/16/2022   Class 2 severe obesity with serious comorbidity and body mass index (BMI) of 37.0 to 37.9 in adult (HCC) 08/22/2015   DDD (degenerative disc disease), lumbosacral 08/22/2015   GERD (gastroesophageal reflux disease) 04/25/2015   Hyperlipemia 10/30/2014   Chronic obstructive pulmonary disease (HCC) 03/29/2014   Tobacco use disorder, continuous  03/29/2014   Asthma 06/05/2013   Pain in shoulder region after shoulder replacement (Right) 11/18/2012    Allergies  Allergen Reactions   Sulfa Antibiotics Swelling    Developed swelling in eyes when using Sulfa eye drops.   Iodinated Contrast Media Nausea Only   Penicillins Nausea And Vomiting    Got really sick, throwing up as child    Immunization History  Administered Date(s) Administered   Influenza-Unspecified 10/22/2015   Pneumococcal Polysaccharide-23 08/30/2014   Tdap 04/14/2023    Past Surgical History:  Procedure Laterality Date   ABDOMINAL HYSTERECTOMY  62 yrs old   JOINT REPLACEMENT Right  May 2015   Shoulder replacement   TONSILLECTOMY AND ADENOIDECTOMY  Age 72    Social History   Tobacco Use   Smoking status: Every Day    Current packs/day: 1.00    Average packs/day: 1 pack/day for 53.0 years (53.0 ttl pk-yrs)    Types: Cigarettes   Tobacco comments:    Smoking 0.25 ppd as of 04/14/23  Vaping Use   Vaping status: Never Used  Substance Use Topics   Alcohol use: No   Drug use: Not Currently    Family History  Problem  Relation Age of Onset   Cirrhosis Father    Depression Sister    COPD Sister    Diabetes Daughter    Depression Sister    COPD Sister    Gallstones Mother    Dementia Mother    Osteoarthritis Mother    Heart attack Brother         06/14/2023    1:29 PM 04/14/2023    1:29 PM 03/19/2023    2:14 PM 02/19/2023    1:17 PM  GAD 7 : Generalized Anxiety Score  Nervous, Anxious, on Edge 0 0 2 2  Control/stop worrying 2 2 3 3   Worry too much - different things 2 2 3 3   Trouble relaxing 2 2 2 2   Restless 1 1 2 2   Easily annoyed or irritable 1 3 3 3   Afraid - awful might happen 1 0 1 1  Total GAD 7 Score 9 10 16 16   Anxiety Difficulty Somewhat difficult Somewhat difficult Not difficult at all Not difficult at all       06/14/2023    1:27 PM 06/09/2023    1:37 PM 05/24/2023    1:05 PM  Depression screen PHQ 2/9  Decreased Interest 2 0  0  Down, Depressed, Hopeless 2 0 0  PHQ - 2 Score 4 0 0  Altered sleeping 0    Tired, decreased energy 3    Change in appetite 3    Feeling bad or failure about yourself  0    Trouble concentrating 2    Moving slowly or fidgety/restless 2    Suicidal thoughts 0    PHQ-9 Score 14    Difficult doing work/chores Somewhat difficult      BP Readings from Last 3 Encounters:  06/14/23 126/84  06/09/23 (!) 130/97  05/24/23 (!) 145/91    Wt Readings from Last 3 Encounters:  06/14/23 235 lb (106.6 kg)  06/09/23 236 lb (107 kg)  05/24/23 226 lb (102.5 kg)    BP 126/84   Pulse 92   Temp 97.6 F (36.4 C) (Oral)   Ht 5\' 6"  (1.676 m)   Wt 235 lb (106.6 kg)   SpO2 95%   BMI 37.93 kg/m   Physical Exam Vitals and nursing note reviewed.  Constitutional:      Appearance: Normal appearance.  Cardiovascular:     Rate and Rhythm: Normal rate and regular rhythm.     Heart sounds: No murmur heard.    No friction rub. No gallop.  Pulmonary:     Effort: Pulmonary effort is normal.     Breath sounds: Wheezing present.  Abdominal:     General: There is no distension.  Musculoskeletal:        General: Normal range of motion.  Skin:    General: Skin is warm and dry.  Neurological:     Mental Status: She is alert and oriented to person, place, and time.     Gait: Gait is intact.  Psychiatric:        Mood and Affect: Affect normal. Mood is depressed.     Recent Labs     Component Value Date/Time   NA 144 05/24/2023 1425   K 4.4 05/24/2023 1425   CL 105 05/24/2023 1425   CO2 24 01/19/2023 1608   GLUCOSE 112 (H) 05/24/2023 1425   BUN 6 (L) 05/24/2023 1425   CREATININE 0.77 05/24/2023 1425   CALCIUM 9.5 05/24/2023 1425   PROT 7.0 05/24/2023 1425   ALBUMIN 4.0  05/24/2023 1425   AST 14 05/24/2023 1425   ALT 13 01/19/2023 1608   ALKPHOS 121 05/24/2023 1425   BILITOT <0.2 05/24/2023 1425    Lab Results  Component Value Date   WBC 7.2 01/19/2023   HGB 15.3 01/19/2023   HCT  44.5 01/19/2023   MCV 91 01/19/2023   PLT 234 01/19/2023   Lab Results  Component Value Date   HGBA1C 5.5 12/20/2014   Lab Results  Component Value Date   CHOL 268 (H) 01/19/2023   HDL 48 01/19/2023   LDLCALC 168 (H) 01/19/2023   TRIG 278 (H) 01/19/2023   CHOLHDL 5.6 (H) 01/19/2023   Lab Results  Component Value Date   TSH 1.810 01/19/2023     Assessment and Plan:  1. Moderate recurrent major depression (HCC) Patient still has Auvelity.  Reminded of purpose for this medication.  Encouraged to take this.  Proceed with grief class as planned  2. Tobacco use disorder, continuous Plan as above.  I think the Auvelity will help her quit smoking.  She was so close to cessation last time.  3. Class 2 severe obesity with serious comorbidity and body mass index (BMI) of 37.0 to 37.9 in adult, unspecified obesity type [facility]) Referring to dietitian per patient request - Amb ref to Medical Nutrition Therapy-MNT  4. Chronic obstructive pulmonary disease, unspecified COPD type (HCC) Encouraged to follow-up with pulmonology in October.  Overdue for LDCT, which was ordered in April 2024.  5. Chronic pain syndrome Continue specialist follow-up.  Reminded she can use diclofenac BID for pain as needed    Return in about 2 months (around 08/14/2023) for OV f/u chronic conditions.  Check lipids next time.   Estimated 50 minutes provider time spent on this patient including review of multiple chronic conditions, specialist notes, recent labs, recent imaging, and medication management.  Alvester Morin, PA-C, DMSc, Nutritionist [facility] [phone]

## 2023-06-22 ENCOUNTER — Ambulatory Visit: Payer: Medicare Other | Admitting: Gastroenterology

## 2023-06-28 ENCOUNTER — Ambulatory Visit: Payer: 59 | Attending: Physician Assistant

## 2023-06-28 DIAGNOSIS — M6281 Muscle weakness (generalized): Secondary | ICD-10-CM | POA: Diagnosis present

## 2023-06-28 DIAGNOSIS — G8929 Other chronic pain: Secondary | ICD-10-CM | POA: Insufficient documentation

## 2023-06-28 DIAGNOSIS — M545 Low back pain, unspecified: Secondary | ICD-10-CM | POA: Diagnosis present

## 2023-06-28 DIAGNOSIS — M47817 Spondylosis without myelopathy or radiculopathy, lumbosacral region: Secondary | ICD-10-CM | POA: Insufficient documentation

## 2023-06-28 NOTE — Progress Notes (Unsigned)
Procedure rescheduled by hospital secondary to patient not having an approved form of transportation.  The patient had an Benedetto Goad which is not allowed by hospital bylaws.  Procedure rescheduled.

## 2023-06-28 NOTE — Therapy (Cosign Needed Addendum)
OUTPATIENT PHYSICAL THERAPY THORACOLUMBAR TREATMENT/RECERTIFICATION  Patient Name: Kelsey Rose MRN: [id number] DOB:1961-07-22, 62 y.o., female Today's Date: 06/29/2023  END OF SESSION:  PT End of Session - 06/28/23 1105     Visit Number 6    Number of Visits 25    Date for PT Re-Evaluation 08/23/23    Authorization Type eval: 04/08/23    PT Start Time 1100    PT Stop Time 1145    PT Time Calculation (min) 45 min    Activity Tolerance Patient tolerated treatment well    Behavior During Therapy WFL for tasks assessed/performed            Past Medical History:  Diagnosis Date   Anxiety    Arthritis    Asthma    COPD (chronic obstructive pulmonary disease) (HCC)    Depression    GERD (gastroesophageal reflux disease)    Past Surgical History:  Procedure Laterality Date   ABDOMINAL HYSTERECTOMY  62 yrs old   JOINT REPLACEMENT Right  May 2015   Shoulder replacement   TONSILLECTOMY AND ADENOIDECTOMY  Age 23   Patient Active Problem List   Diagnosis Date Noted   Spondylosis without myelopathy or radiculopathy, lumbosacral region 06/28/2023   T12 wedge compression fracture, sequela 06/09/2023   DDD (degenerative disc disease), cervical 06/09/2023   COPD with chronic bronchitis 06/09/2023   Osteoarthritis of hips (Bilateral) 06/09/2023   Cervical foraminal stenosis (Right: CC4-5, C5-6, C6-7, C7-T1) 06/09/2023   Cervical facet hypertrophy 06/09/2023   Cervical facet arthropathy 06/09/2023   Cervical facet joint pain 06/09/2023   Osteophytosis of shoulder (Left) 06/09/2023   Osteoarthritis of AC (acromioclavicular) joint (Left) 06/09/2023   Lumbar facet arthropathy 06/09/2023   Lumbar facet joint pain 06/09/2023   Osteopenia determined by x-ray 06/09/2023   Osteopenia of hips (Bilateral) 06/09/2023   Ligamentum flavum hypertrophy (L3-4, L4-5, L5-S1) 06/09/2023   DDD (degenerative disc disease), thoracic 06/09/2023   Lumbar intervertebral disc protrusion (Left: L3-4,  L4-5) 06/09/2023   Lumbar foraminal stenosis (Left: L3-4, L5-S1) 06/09/2023   Abnormal x-ray of cervical spine 06/01/2023   Abnormal MRI, lumbar spine (06/17/2015) 05/24/2023   Chronic hip pain (4th area of Pain) (Bilateral) (L>R) 05/24/2023   Chronic shoulder pain (2ry area of Pain) (Bilateral) (L>R) 05/24/2023   History of total shoulder replacement (Right) 05/24/2023   Osteoarthritis involving multiple joints 05/24/2023   Chronic neck pain (3ry area of Pain) (Midline) 05/24/2023   Chronic pain syndrome 05/23/2023   Pharmacologic therapy 05/23/2023   Disorder of skeletal system 05/23/2023   Problems influencing health status 05/23/2023   Aortic atherosclerosis (HCC) 03/25/2023   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 03/25/2023   Osteoporosis 03/25/2023   Generalized anxiety disorder 02/19/2023   Insomnia due to other mental disorder 02/19/2023   Benign paroxysmal positional vertigo 04/28/2022   Chronic low back pain (1ry area of Pain) (Bilateral) (L>R) w/o sciatica 04/28/2022   Moderate recurrent major depression (HCC) 04/28/2022   History of vitamin D deficiency 04/28/2022   Chronic diarrhea 02/16/2022   Class 2 severe obesity with serious comorbidity and body mass index (BMI) of 37.0 to 37.9 in adult (HCC) 08/22/2015   DDD (degenerative disc disease), lumbosacral 08/22/2015   GERD (gastroesophageal reflux disease) 04/25/2015   Hyperlipemia 10/30/2014   Chronic obstructive pulmonary disease (HCC) 03/29/2014   Tobacco use disorder, continuous 03/29/2014   Asthma 06/05/2013   Pain in shoulder region after shoulder replacement (Right) 11/18/2012   PCP: Remo Lipps, PA  REFERRING PROVIDER: Tillie Fantasia  P, PA  REFERRING DIAG: M25.512 (ICD-10-CM) - Pain in joint of left shoulder M54.50,G89.29 (ICD-10-CM) - Chronic bilateral low back pain without sciatica M15.9 (ICD-10-CM) - Primary osteoarthritis involving multiple joints  RATIONALE FOR EVALUATION AND TREATMENT:  Rehabilitation  THERAPY DIAG: Chronic left-sided low back pain without sciatica  Muscle weakness (generalized)  ONSET DATE: 03/2015 (approximate);  FOLLOW-UP APPT SCHEDULED WITH REFERRING PROVIDER: Yes, 04/16/23;  FROM INITIAL EVALUATION SUBJECTIVE:                                                                                                                                                                                         SUBJECTIVE STATEMENT:  Chronic low back pain.   PERTINENT HISTORY:  Pt referred for chronic L sided low back pain. She reports that the pain is from arthritis in her back and occasionally refers into her left hip. Pain for started 9 to 10 years ago. She denies any traumatic onset or back surgeries. She has tried injections but was allergic to the dye. She has also tried physical therapy in the past with some benefit. Most recent lumbar MRI was 06/2015 and showed lower thoracic degenerative disc disease, with advanced changes at T12-L1. L3-4, L4-5, and L5-S1 degenerative disc disease. Mild left neural foraminal narrowing at L3-4 and L5-S1. She has a history of R shoulder replacement. The symptoms are aggravated by bending and twisting. She has tried Celebrex without benefit. She was prescribed diclofenac but was concerned about possible GIB so never filled prescription. No history of L shoulder surgery but complains of regular left shoulder pain. History of comorbid insomnia and depression. She has an appointment to follow-up with PCP for CPE in the coming months.  03/19/23 CLINICAL DATA:  Chronic osteoarthritis.  Left shoulder pain.   EXAM: LEFT SHOULDER - 2+ VIEW   COMPARISON:  None available   FINDINGS: There is diffuse decreased bone mineralization. Mild glenohumeral joint space narrowing, subchondral sclerosis, and peripheral glenoid degenerative osteophytosis. Moderate-to-large inferior humeral head-neck junction degenerative osteophytes. There are  approximate 17 mm and 16 mm ossicles overlying the axillary pouch just medial to the proximal humeral surgical neck, loose bodies.   Mild acromioclavicular joint space narrowing without significant peripheral osteophytosis.   No acute fracture or dislocation.   IMPRESSION: 1. 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 2. Two dominant loose bodies within the axillary pouch.  CLINICAL DATA:  Chronic lower back pain.   EXAM: LUMBAR SPINE - COMPLETE 4+ VIEW   COMPARISON:  None Available.   FINDINGS: There are 5 non-rib-bearing lumbar-type vertebral bodies. Minimal dextrocurvature centered at L2-3. Mild left L2-3 disc space narrowing.   Moderate anterior T12-L1 disc space narrowing  and anterior endplate osteophytes. Mild anterior T11-12 disc space narrowing with moderate anterior endplate osteophytes. Minimal posterior L5-S1 disc space narrowing.   Vertebral body heights are maintained.   No pars defect is identified.   Moderate atherosclerotic calcifications.   IMPRESSION: Mild L2-3 and L5-S1 degenerative disc and endplate changes.   Moderate anterior T12-L1 degenerative disc and endplate changes.   PAIN:    Pain Intensity: Present: 7/10, Best: 4/10, Worst: 8/10 Pain location: bilateral lower lumbar spine (R side is worse) Pain Quality: throbbing,  Radiating: Yes, bilateral buttocks Numbness/Tingling: No Focal Weakness: No Aggravating factors: bending, twisting, lifting, extended standing, cooking, washing dishes, extended sitting; Easing factors: Lidocaine patches, hot shower, dry needling, home TENS; 24-hour pain behavior: increased pain when first waking but pain stays intense throughout the day; History of prior back injury, pain, surgery, or therapy: Yes Dominant hand: right Imaging: Yes, plain films and MRI (see history);  Red flags: Positive: worsening urinary incontinence; Negative for saddle paresthesia, personal history of cancer, h/o spinal tumors, h/o  compression fx, h/o abdominal aneurysm, abdominal pain, chills/fever, night sweats, nausea, vomiting, unrelenting pain, first onset of insidious LBP <20 y/o  PRECAUTIONS: None  WEIGHT BEARING RESTRICTIONS: No  FALLS: Has patient fallen in last 6 months? No  Living Environment Lives with:  Lives with a friend Lives in: House/apartment Stairs: 2 steps to enter, 1 step inside;  Prior level of function: Independent  Occupational demands: On disability  Hobbies: Walk dog, play on phone, watch TV   Patient Goals: reduce/eliminate pain, be more active   OBJECTIVE:  Patient Surveys  Modified Oswestry:  FOTO: 38, predicted improvement to 20;   Cognition Patient is oriented to person, place, and time.  Recent memory is intact.  Remote memory is intact.  Attention span and concentration are intact.  Expressive speech is intact.  Patient's fund of knowledge is within normal limits for educational level.    Gross Musculoskeletal Assessment Tremor: None Bulk: Normal Tone: Normal No visible step-off along spinal column, no signs of scoliosis  GAIT: Decreased self-selected gait speed. Gait appears antalgic;  Posture: Lumbar lordosis: Decreased normal lordosis Iliac crest height: Equal bilaterally Lumbar lateral shift: Negative  AROM AROM (Normal range in degrees) AROM   Lumbar   Flexion (65) Mild loss*  Extension (30) Mod loss*  Right lateral flexion (25) Mod loss*  Left lateral flexion (25) Severe loss*  Right rotation (30) Mild loss  Left rotation (30) Mild loss  (* = pain; Blank rows = not tested)  LE MMT: MMT (out of 5) Right  Left   Hip flexion 4+* 4+*  Hip extension    Hip abduction (seated) 5 5  Hip adduction (seated) 5 5  Hip internal rotation    Hip external rotation    Knee flexion 5 5  Knee extension 5 5  Ankle dorsiflexion 5 5  Ankle plantarflexion Strong Strong  (* = pain; Blank rows = not tested)  Sensation Grossly intact to light touch  throughout bilateral LEs as determined by testing dermatomes L2-S2. Proprioception, stereognosis, and hot/cold testing deferred on this date.  Reflexes Deferred  Muscle Length Hamstrings: R: Not examined L: Not examined Ely (quadriceps): R: Not examined L: Not examined Thomas (hip flexors): R: Not examined L: Not examined Ober: R: Not examined L: Not examined  Palpation Location Right Left         Lumbar paraspinals 1 1  Quadratus Lumborum 1 1  Gluteus Maximus    Gluteus Medius  Deep hip external rotators    PSIS    Fortin's Area (SIJ)    Greater Trochanter    (Blank rows = not tested) Graded on 0-4 scale (0 = no pain, 1 = pain, 2 = pain with wincing/grimacing/flinching, 3 = pain with withdrawal, 4 = unwilling to allow palpation)  Passive Accessory Intervertebral Motion Deferred  Special Tests Lumbar Radiculopathy and Discogenic: Centralization and Peripheralization (SN 92, -LR 0.12): Not examined Slump (SN 83, -LR 0.32): R: Not examined L: Not examined SLR (SN 92, -LR 0.29): R: Not examined L:  Not examined Crossed SLR (SP 90): R: Not examined L: Not examined  Facet Joint: Extension-Rotation (SN 100, -LR 0.0): R: Not examined L: Not examined  Lumbar Foraminal Stenosis: Lumbar quadrant (SN 70): R: Not examined L: Not examined  Hip: FABER (SN 81): R: Not examined L: Not examined FADIR (SN 94): R: Not examined L: Not examined Hip scour (SN 50): R: Not examined L: Not examined  SIJ:  Thigh Thrust (SN 88, -LR 0.18) : R: Not examined L: Not examined  Piriformis Syndrome: FAIR Test (SN 88, SP 83): R: Not examined L: Not examined  Functional Tasks Deferred  Beighton scale: Deferred   TODAY'S TREATMENT:   SUBJECTIVE: Pt reports that she is doing alright . She reports ongoing pain in the lower back, additionally her right leg has presented intermittent pain. She reports that she is getting back injections tomorrow. No specific questions or concerns currently.     PAIN: 4/10 left low back and left hip pain;   Manual Therapy  STM to bilateral lumbar paraspinals and quadratus lumborum with effleurage and hypervolt x 8 minutes; Manual Crossbody Stretch 2 x 30s Manual Figure 4 Stretch 1 x 30s   Ther-ex NuStep L1-2 x 10 minutes for BLE strengthening and warm-up during interval history, PT increased resistance; Modified Deadbug with manual pertubations 2 x 30s ;  Supine Bridges 1 x 10 (pain in right lateral leg) ;  Hooklying Clamshell with Blue Theraband 1 x 10;  Hooklying lumbar rotation rocking 2 x 60s each direction; Hooklying Adductor Ball Squeeze 1 x 10;   Not Performed:  Prone hip extension x 10 BLE; Hooklying alternating BLE marches 2 x 10 BLE; Hooklying clams with manual resistance from therapist 2 x 10 BLE; Hooklying adductor squeezes with manual resistance from therapist 2 x 10 BLE;  PATIENT EDUCATION:  Education details: Plan of care Person educated: Patient Education method: Explanation Education comprehension: verbalized understanding   HOME EXERCISE PROGRAM:  None currently   ASSESSMENT:  CLINICAL IMPRESSION: Pt demonstrates excellent participation with therapy today. Progressed strengthening and utilized STM for tissue extensibility and pain control. Educated patient on child's pose in order to relieve tightness in posterior musculature with good return. Kelsey Rose has demonstrated steady improvements in physical therapy and continues to improve with LE and core strength. Her pain has steadily improved; today she reported resting back pain at a 4/10. She endorsed adherence with HEP and was able to verbalize all of the prescribed stretches and exercises. She has not been to therapy in an extended time due to other scheduling conflicts so outcome measures/goals deferred to next session. PT recommends to continue 1x/wk of skilled PT services to address deficits in strength, ROM, and pain in order to return to full function at  home.   OBJECTIVE IMPAIRMENTS: Abnormal gait, decreased activity tolerance, decreased ROM, decreased strength, obesity, and pain.   ACTIVITY LIMITATIONS: lifting, bending, sitting, standing, and squatting  PARTICIPATION LIMITATIONS: meal  prep, cleaning, laundry, driving, shopping, and community activity  PERSONAL FACTORS: Behavior pattern, Past/current experiences, Time since onset of injury/illness/exacerbation, and 3+ comorbidities: GAD, depression, BPPV, OA, obesity, and insomnia  are also affecting patient's functional outcome.   REHAB POTENTIAL: Fair    CLINICAL DECISION MAKING: Unstable/unpredictable  EVALUATION COMPLEXITY: High   GOALS: Goals reviewed with patient? No  SHORT TERM GOALS: Target date: 05/06/2023  Pt will be independent with HEP in order to improve strength and decrease back pain to improve pain-free function at home and work. Baseline:  Goal status: INITIAL   LONG TERM GOALS: Target date: 06/03/2023  Pt will increase FOTO to at least 45 to demonstrate significant improvement in function at home and work related to back pain  Baseline: 04/08/23: 38; Goal status: INITIAL  2.  Pt will decrease worst back pain by at least 2 points on the NPRS in order to demonstrate clinically significant reduction in back pain. Baseline: 04/08/23: worst: 8/10; Goal status: INITIAL  3.  Pt will decrease mODI score by at least 13 points in order demonstrate clinically significant reduction in back pain/disability.       Baseline: 04/08/23: To be completed Goal status: INITIAL   PLAN: PT FREQUENCY: 1x/week  PT DURATION: 8 weeks  PLANNED INTERVENTIONS: Therapeutic exercises, Therapeutic activity, Neuromuscular re-education, Balance training, Gait training, Patient/Family education, Self Care, Joint mobilization, Joint manipulation, Vestibular training, Canalith repositioning, Orthotic/Fit training, DME instructions, Dry Needling, Electrical stimulation, Spinal manipulation,  Spinal mobilization, Cryotherapy, Moist heat, Taping, Traction, Ultrasound, Ionotophoresis 4mg /ml Dexamethasone, Manual therapy, and Re-evaluation.  PLAN FOR NEXT SESSION: neural tension testing, PAIVM testing, manual techniques and TENS for pain control, progressive lumbar stabilization program;  Cristal Deer Shakyla Nolley, SPT Jason D Huprich PT, DPT, [facility]

## 2023-06-28 NOTE — Patient Instructions (Incomplete)
____________________________________________________________________________________________  Post-Procedure Discharge Instructions  Instructions: Apply ice:  Purpose: This will minimize any swelling and discomfort after procedure.  When: Day of procedure, as soon as you get home. How: Fill a plastic sandwich bag with crushed ice. Cover it with a small towel and apply to injection site. How long: (15 min on, 15 min off) Apply for 15 minutes then remove x 15 minutes.  Repeat sequence on day of procedure, until you go to bed. Apply heat:  Purpose: To treat any soreness and discomfort from the procedure. When: Starting the next day after the procedure. How: Apply heat to procedure site starting the day following the procedure. How long: May continue to repeat daily, until discomfort goes away. Food intake: Start with clear liquids (like water) and advance to regular food, as tolerated.  Physical activities: Keep activities to a minimum for the first 8 hours after the procedure. After that, then as tolerated. Driving: If you have received any sedation, be responsible and do not drive. You are not allowed to drive for 24 hours after having sedation. Blood thinner: (Applies only to those taking blood thinners) You may restart your blood thinner 6 hours after your procedure. Insulin: (Applies only to Diabetic patients taking insulin) As soon as you can eat, you may resume your normal dosing schedule. Infection prevention: Keep procedure site clean and dry. Shower daily and clean area with soap and water. Post-procedure Pain Diary: Extremely important that this be done correctly and accurately. Recorded information will be used to determine the next step in treatment. For the purpose of accuracy, follow these rules: Evaluate only the area treated. Do not report or include pain from an untreated area. For the purpose of this evaluation, ignore all other areas of pain, except for the treated  area. After your procedure, avoid taking a long nap and attempting to complete the pain diary after you wake up. Instead, set your alarm clock to go off every hour, on the hour, for the initial 8 hours after the procedure. Document the duration of the numbing medicine, and the relief you are getting from it. Do not go to sleep and attempt to complete it later. It will not be accurate. If you received sedation, it is likely that you were given a medication that may cause amnesia. Because of this, completing the diary at a later time may cause the information to be inaccurate. This information is needed to plan your care. Follow-up appointment: Keep your post-procedure follow-up evaluation appointment after the procedure (usually 2 weeks for most procedures, 6 weeks for radiofrequencies). DO NOT FORGET to bring you pain diary with you.   Expect: (What should I expect to see with my procedure?) From numbing medicine (AKA: Local Anesthetics): Numbness or decrease in pain. You may also experience some weakness, which if present, could last for the duration of the local anesthetic. Onset: Full effect within 15 minutes of injected. Duration: It will depend on the type of local anesthetic used. On the average, 1 to 8 hours.  From steroids (Applies only if steroids were used): Decrease in swelling or inflammation. Once inflammation is improved, relief of the pain will follow. Onset of benefits: Depends on the amount of swelling present. The more swelling, the longer it will take for the benefits to be seen. In some cases, up to 10 days. Duration: Steroids will stay in the system x 2 weeks. Duration of benefits will depend on multiple posibilities including persistent irritating factors. Side-effects: If present, they  may typically last 2 weeks (the duration of the steroids). Frequent: Cramps (if they occur, drink Gatorade and take over-the-counter Magnesium 450-500 mg once to twice a day); water retention with  temporary weight gain; increases in blood sugar; decreased immune system response; increased appetite. Occasional: Facial flushing (red, warm cheeks); mood swings; menstrual changes. Uncommon: Long-term decrease or suppression of natural hormones; bone thinning. (These are more common with higher doses or more frequent use. This is why we prefer that our patients avoid having any injection therapies in other practices.)  Very Rare: Severe mood changes; psychosis; aseptic necrosis. From procedure: Some discomfort is to be expected once the numbing medicine wears off. This should be minimal if ice and heat are applied as instructed.  Call if: (When should I call?) You experience numbness and weakness that gets worse with time, as opposed to wearing off. New onset bowel or bladder incontinence. (Applies only to procedures done in the spine)  Emergency Numbers: Durning business hours (Monday - Thursday, 8:00 AM - 4:00 PM) (Friday, 9:00 AM - 12:00 Noon): [phone] After hours: [phone] NOTE: If you are having a problem and are unable connect with, or to talk to a provider, then go to your nearest urgent care or emergency department. If the problem is serious and urgent, please call 911. ____________________________________________________________________________________________

## 2023-06-29 ENCOUNTER — Ambulatory Visit: Payer: 59 | Attending: Pain Medicine | Admitting: Pain Medicine

## 2023-06-29 ENCOUNTER — Ambulatory Visit: Admission: RE | Admit: 2023-06-29 | Payer: 59 | Source: Ambulatory Visit

## 2023-06-29 VITALS — BP 133/65 | HR 78 | Temp 97.3°F | Resp 16

## 2023-06-29 DIAGNOSIS — R937 Abnormal findings on diagnostic imaging of other parts of musculoskeletal system: Secondary | ICD-10-CM

## 2023-06-29 DIAGNOSIS — Z538 Procedure and treatment not carried out for other reasons: Secondary | ICD-10-CM

## 2023-06-29 DIAGNOSIS — M5137 Other intervertebral disc degeneration, lumbosacral region: Secondary | ICD-10-CM

## 2023-06-29 DIAGNOSIS — M47817 Spondylosis without myelopathy or radiculopathy, lumbosacral region: Secondary | ICD-10-CM

## 2023-06-29 DIAGNOSIS — M5459 Other low back pain: Secondary | ICD-10-CM

## 2023-06-29 DIAGNOSIS — M545 Low back pain, unspecified: Secondary | ICD-10-CM

## 2023-06-29 DIAGNOSIS — M47816 Spondylosis without myelopathy or radiculopathy, lumbar region: Secondary | ICD-10-CM

## 2023-06-29 DIAGNOSIS — M51379 Other intervertebral disc degeneration, lumbosacral region without mention of lumbar back pain or lower extremity pain: Secondary | ICD-10-CM

## 2023-06-29 DIAGNOSIS — G8929 Other chronic pain: Secondary | ICD-10-CM

## 2023-06-29 MED ORDER — FENTANYL CITRATE (PF) 100 MCG/2ML IJ SOLN: 25.0000 ug | INTRAMUSCULAR | Status: DC | PRN

## 2023-06-29 MED ORDER — LIDOCAINE HCL 2 % IJ SOLN: 20.0000 mL | Freq: Once | INTRAMUSCULAR | Status: DC

## 2023-06-29 MED ORDER — LACTATED RINGERS IV SOLN: Freq: Once | INTRAVENOUS | Status: DC

## 2023-06-29 MED ORDER — MIDAZOLAM HCL 5 MG/5ML IJ SOLN: 0.5000 mg | Freq: Once | INTRAMUSCULAR | Status: DC

## 2023-06-29 MED ORDER — ROPIVACAINE HCL 2 MG/ML IJ SOLN: 18.0000 mL | Freq: Once | INTRAMUSCULAR | Status: DC

## 2023-06-29 MED ORDER — PENTAFLUOROPROP-TETRAFLUOROETH EX AERO
INHALATION_SPRAY | Freq: Once | CUTANEOUS | Status: DC
  Filled 2023-06-29: qty 116

## 2023-06-29 MED ORDER — TRIAMCINOLONE ACETONIDE 40 MG/ML IJ SUSP: 80.0000 mg | Freq: Once | INTRAMUSCULAR | Status: DC

## 2023-06-30 ENCOUNTER — Other Ambulatory Visit: Payer: Self-pay | Admitting: *Deleted

## 2023-06-30 DIAGNOSIS — Z87891 Personal history of nicotine dependence: Secondary | ICD-10-CM

## 2023-06-30 DIAGNOSIS — F17210 Nicotine dependence, cigarettes, uncomplicated: Secondary | ICD-10-CM

## 2023-06-30 DIAGNOSIS — Z122 Encounter for screening for malignant neoplasm of respiratory organs: Secondary | ICD-10-CM

## 2023-07-02 ENCOUNTER — Ambulatory Visit (INDEPENDENT_AMBULATORY_CARE_PROVIDER_SITE_OTHER): Payer: 59 | Admitting: Physician Assistant

## 2023-07-02 ENCOUNTER — Encounter: Payer: Self-pay | Admitting: Physician Assistant

## 2023-07-02 DIAGNOSIS — F17210 Nicotine dependence, cigarettes, uncomplicated: Secondary | ICD-10-CM

## 2023-07-02 NOTE — Patient Instructions (Addendum)

## 2023-07-02 NOTE — Progress Notes (Signed)
Virtual Visit via Telephone Note  I connected with Kelsey Rose on 07/02/23 at  8:30 AM EDT by telephone and verified that I am speaking with the correct person using two identifiers.  Location: Patient: Home Provider: Remotely   I discussed the limitations, risks, security and privacy concerns of performing an evaluation and management service by telephone and the availability of in person appointments. I also discussed with the patient that there may be a patient responsible charge related to this service. The patient expressed understanding and agreed to proceed.    Shared Decision Making Visit Lung Cancer Screening Program [phone])    Eligibility: Age 2 y.o. Pack Years Smoking History Calculation 54 (# packs/per year x # years smoked) Recent History of coughing up blood  no Unexplained weight loss? no ( >Than 15 pounds within the last 6 months ) Prior History Lung / other cancer no (Diagnosis within the last 5 years already requiring surveillance chest CT Scans). Smoking Status Current Smoker Former Smokers: Years since quit: N/A  Quit Date: N/A  Visit Components: Discussion included one or more decision making aids. yes Discussion included risk/benefits of screening. yes Discussion included potential follow up diagnostic testing for abnormal scans. yes Discussion included meaning and risk of over diagnosis. yes Discussion included meaning and risk of False Positives. yes Discussion included meaning of total radiation exposure. yes  Counseling Included: Importance of adherence to annual lung cancer LDCT screening. yes Impact of comorbidities on ability to participate in the program. yes Ability and willingness to under diagnostic treatment. yes  Smoking Cessation Counseling: Current Smokers:  Discussed importance of smoking cessation. yes Information about tobacco cessation classes and interventions provided to patient. yes Patient provided with "ticket" for  LDCT Scan. yes Symptomatic Patient. no  Counseling(Intermediate counseling: > three minutes) 99406 Diagnosis Code: Tobacco Use Z72.0 Asymptomatic Patient yes  Counseling (Intermediate counseling: > three minutes counseling) A2130 Former Smokers:  Discussed the importance of maintaining cigarette abstinence. N/A Diagnosis Code: Personal History of Nicotine Dependence. Q65.784 Information about tobacco cessation classes and interventions provided to patient. Yes Patient provided with "ticket" for LDCT Scan. yes Written Order for Lung Cancer Screening with LDCT placed in Epic. Yes (CT Chest Lung Cancer Screening Low Dose W/O CM) [id number] Z12.2-Screening of respiratory organs Z87.891-Personal history of nicotine dependence     Dontavion Noxon Celine Mans, PA-C

## 2023-07-07 ENCOUNTER — Ambulatory Visit: Admission: RE | Admit: 2023-07-07 | Payer: 59 | Source: Ambulatory Visit

## 2023-07-14 ENCOUNTER — Ambulatory Visit
Admission: RE | Admit: 2023-07-14 | Discharge: 2023-07-14 | Disposition: A | Discharge status: Home / Self Care | Payer: 59 | Source: Ambulatory Visit | Attending: Acute Care | Admitting: Acute Care

## 2023-07-14 DIAGNOSIS — Z87891 Personal history of nicotine dependence: Secondary | ICD-10-CM | POA: Insufficient documentation

## 2023-07-14 DIAGNOSIS — F17210 Nicotine dependence, cigarettes, uncomplicated: Secondary | ICD-10-CM | POA: Diagnosis present

## 2023-07-14 DIAGNOSIS — Z122 Encounter for screening for malignant neoplasm of respiratory organs: Secondary | ICD-10-CM | POA: Diagnosis present

## 2023-07-15 ENCOUNTER — Ambulatory Visit: Payer: 59 | Admitting: Pain Medicine

## 2023-07-23 ENCOUNTER — Other Ambulatory Visit: Payer: Self-pay

## 2023-07-23 ENCOUNTER — Other Ambulatory Visit: Payer: Self-pay | Admitting: Physician Assistant

## 2023-07-23 DIAGNOSIS — F17210 Nicotine dependence, cigarettes, uncomplicated: Secondary | ICD-10-CM

## 2023-07-23 DIAGNOSIS — Z87891 Personal history of nicotine dependence: Secondary | ICD-10-CM

## 2023-07-23 DIAGNOSIS — E782 Mixed hyperlipidemia: Secondary | ICD-10-CM

## 2023-07-23 DIAGNOSIS — Z122 Encounter for screening for malignant neoplasm of respiratory organs: Secondary | ICD-10-CM

## 2023-07-23 MED ORDER — ATORVASTATIN CALCIUM 20 MG PO TABS
20.0000 mg | ORAL_TABLET | Freq: Every day | ORAL | 0 refills | Status: DC
Start: 2023-07-23 — End: 2023-10-21

## 2023-07-23 NOTE — Telephone Encounter (Signed)
Please review.  KP

## 2023-08-10 ENCOUNTER — Ambulatory Visit (INDEPENDENT_AMBULATORY_CARE_PROVIDER_SITE_OTHER): Payer: 59 | Admitting: Gastroenterology

## 2023-08-10 ENCOUNTER — Encounter: Payer: Self-pay | Admitting: Gastroenterology

## 2023-08-10 ENCOUNTER — Other Ambulatory Visit: Payer: Self-pay | Admitting: Physician Assistant

## 2023-08-10 VITALS — BP 133/82 | HR 82 | Temp 98.1°F | Ht 66.0 in | Wt 232.0 lb

## 2023-08-10 DIAGNOSIS — R197 Diarrhea, unspecified: Secondary | ICD-10-CM | POA: Diagnosis not present

## 2023-08-10 MED ORDER — NA SULFATE-K SULFATE-MG SULF 17.5-3.13-1.6 GM/177ML PO SOLN: 1.0000 | Freq: Once | ORAL | 0 refills | Status: AC

## 2023-08-10 NOTE — Progress Notes (Signed)
Gastroenterology Consultation  Referring Provider:     Remo Lipps, PA Primary Care Physician:  Remo Lipps, PA Primary Gastroenterologist:  Dr. Servando Snare     Reason for Consultation:     Chronic diarrhea        HPI:   Kelsey Rose is a 61 y.o. y/o female referred for consultation & management of chronic diarrhea by Dr. Mordecai Maes, Melton Alar, PA.  This patient comes in today after being seen in May of last year at [facility] for the same diarrhea.  It was reported that the patient was taking Imodium for her diarrhea.  She has significant watery diarrhea.  The patient had reported at [facility] last year that she was having 4-5 bowel movements daily with urgency and interfering with her quality of life.  The patient had a colonoscopy in 2019 with normal mucosa endoscopically.  She was reported to be refractory to Imodium and had negative stool studies including negative fecal calprotectin.  The patient was told to increase her Imodium and a colonoscopy with random biopsies was recommended.  It was also reported that the diarrhea was waking the patient up at night with significant urgency but denied any blood or abdominal pain.  There is also reported that she has not had a cholecystectomy. The patient reports that she had 20 polyps at her last colonoscopy and thought it was that now been but it turns out that the patient had that by Dr. Mare Loan at [facility].  The patient has not followed up with a colonoscopy with random colon biopsies as recommended at [facility]  Past Medical History:  Diagnosis Date   Anxiety    Arthritis    Asthma    COPD (chronic obstructive pulmonary disease) (HCC)    Depression    GERD (gastroesophageal reflux disease)     Past Surgical History:  Procedure Laterality Date   ABDOMINAL HYSTERECTOMY  62 yrs old   JOINT REPLACEMENT Right  May 2015   Shoulder replacement   TONSILLECTOMY AND ADENOIDECTOMY  Age 62    Prior to Admission medications   Medication Sig Start Date End Date  Taking? Authorizing Provider  albuterol (ACCUNEB) 1.25 MG/3ML nebulizer solution Take 1 ampule by nebulization every 6 (six) hours as needed for wheezing.    [provider]  albuterol (PROVENTIL HFA;VENTOLIN HFA) 108 (90 Base) MCG/ACT inhaler Inhale 2 puffs into the lungs every 6 (six) hours as needed for wheezing or shortness of breath. 01/09/17   Orvil Feil, PA-C  alendronate (FOSAMAX) 70 MG tablet Take 1 tablet (70 mg total) by mouth every 7 (seven) days. Take with a full glass of water on an empty stomach. 04/14/23   Remo Lipps, PA  atorvastatin (LIPITOR) 20 MG tablet Take 1 tablet (20 mg total) by mouth daily. 07/23/23 01/19/24  Remo Lipps, PA  bismuth subsalicylate (PEPTO BISMOL) 262 MG chewable tablet Chew 524 mg by mouth as needed.    [provider]  citalopram (CELEXA) 40 MG tablet Take 40 mg by mouth daily.    [provider]  lidocaine (LIDODERM) 5 % Place 1 patch onto the skin daily. Remove & Discard patch within 12 hours or as directed by MD    [provider]  loperamide (IMODIUM A-D) 2 MG tablet Take 2 mg by mouth 4 (four) times daily as needed for diarrhea or loose stools.    [provider]  mirtazapine (REMERON) 7.5 MG tablet Take 7.5 mg by mouth at  bedtime. 05/12/23   [provider]  naproxen sodium (ALEVE) 220 MG tablet Take 220 mg by mouth as needed.    [provider]  nystatin powder Apply 1 Application topically 3 (three) times daily. PRN    [provider]  ondansetron (ZOFRAN) 4 MG tablet Take 4 mg by mouth every 8 (eight) hours as needed for nausea or vomiting.    [provider]  Tiotropium Bromide-Olodaterol (STIOLTO RESPIMAT) 2.5-2.5 MCG/ACT AERS Inhale 2 each into the lungs daily.    [provider]    Family History  Problem Relation Age of Onset   Cirrhosis Father    Depression Sister    COPD Sister    Diabetes Daughter    Depression Sister    COPD Sister     Gallstones Mother    Dementia Mother    Osteoarthritis Mother    Heart attack Brother      Social History   Tobacco Use   Smoking status: Every Day    Current packs/day: 1.00    Average packs/day: 1 pack/day for 53.0 years (53.0 ttl pk-yrs)    Types: Cigarettes   Tobacco comments:    Smoking 0.25 ppd as of 04/14/23  Vaping Use   Vaping status: Never Used  Substance Use Topics   Alcohol use: No   Drug use: Not Currently    Allergies as of 08/10/2023 - Review Complete 07/02/2023  Allergen Reaction Noted   Sulfa antibiotics Swelling 03/03/2016   Iodinated contrast media Nausea Only 02/24/2016   Penicillins Nausea And Vomiting 03/03/2016    Review of Systems:    All systems reviewed and negative except where noted in HPI.   Physical Exam:  There were no vitals taken for this visit. No LMP recorded. Patient has had a hysterectomy. General:   Alert,  Well-developed, well-nourished, pleasant and cooperative in NAD Head:  Normocephalic and atraumatic. Eyes:  Sclera clear, no icterus.   Conjunctiva pink. Ears:  Normal auditory acuity. Neck:  Supple; no masses or thyromegaly. Lungs:  Respirations even and unlabored.  Clear throughout to auscultation.   No wheezes, crackles, or rhonchi. No acute distress. Heart:  Regular rate and rhythm; no murmurs, clicks, rubs, or gallops. Abdomen:  Normal bowel sounds.  No bruits.  Soft, non-tender and non-distended without masses, hepatosplenomegaly or hernias noted.  No guarding or rebound tenderness.  Negative Carnett sign.   Rectal:  Deferred.  Pulses:  Normal pulses noted. Extremities:  No clubbing or edema.  No cyanosis. Neurologic:  Alert and oriented x3;  grossly normal neurologically. Skin:  Intact without significant lesions or rashes.  No jaundice. Lymph Nodes:  No significant cervical adenopathy. Psych:  Alert and cooperative. Normal mood and affect.  Imaging Studies: CT CHEST LUNG CA SCREEN LOW DOSE W/O CM  Result Date:  07/22/2023 CLINICAL DATA:  62 year old female with 54 pack-year history of smoking. Lung cancer screening. EXAM: CT CHEST WITHOUT CONTRAST LOW-DOSE FOR LUNG CANCER SCREENING TECHNIQUE: Multidetector CT imaging of the chest was performed following the standard protocol without IV contrast. RADIATION DOSE REDUCTION: This exam was performed according to the departmental dose-optimization program which includes automated exposure control, adjustment of the mA and/or kV according to patient size and/or use of iterative reconstruction technique. COMPARISON:  None Available. FINDINGS: Cardiovascular: The heart size is normal. No substantial pericardial effusion. Coronary artery calcification is evident. Mild atherosclerotic calcification is noted in the wall of the thoracic aorta. Mediastinum/Nodes: No mediastinal lymphadenopathy. No evidence for gross hilar lymphadenopathy  although assessment is limited by the lack of intravenous contrast on the current study. The esophagus has normal imaging features. There is no axillary lymphadenopathy. Lungs/Pleura: Centrilobular and paraseptal emphysema evident. Tiny right lung nodules measure up to maximum volume derived equivalent diameter of 2.5 mm. There is circumferential bronchial wall thickening in both lungs with scattered areas of peripheral small airway impaction. No overtly suspicious pulmonary nodule or mass. No focal airspace consolidation. No pleural effusion. Upper Abdomen: Visualized portion of the upper abdomen is unremarkable. Musculoskeletal: No worrisome lytic or sclerotic osseous abnormality. IMPRESSION: 1. Lung-RADS 2, benign appearance or behavior. Continue annual screening with low-dose chest CT without contrast in 12 months. 2. Emphysema (ICD10-J43.9) and Aortic Atherosclerosis (ICD10-170.0) Electronically Signed   By: Kennith Center M.D.   On: 07/22/2023 13:06    Assessment and Plan:   Kelsey Rose is a 62 y.o. y/o female who comes in today with a  history of longstanding diarrhea without any unexplained weight loss fevers chills nausea vomiting black stools or bloody stools.  The patient was supposed to be set up for a colonoscopy at [facility] but states that she has changed to [place].  The patient will be set up for colonoscopy due to her diarrhea.  The patient has been explained the plan and agrees with it.    Midge Minium, MD. Clementeen Graham    Note: This dictation was prepared with Dragon dictation along with smaller phrase technology. Any transcriptional errors that result from this process are unintentional.

## 2023-08-11 NOTE — Telephone Encounter (Signed)
Requested medication (s) are due for refill today: Amount not specified  Requested medication (s) are on the active medication list: yes    Last refill: 06/09/23  Quantity not specified.  Future visit scheduled yes  08/16/23  Notes to clinic:Historical Provider, please review. Thank you.  Requested Prescriptions  Pending Prescriptions Disp Refills   mirtazapine (REMERON) 7.5 MG tablet [Pharmacy Med Name: MIRTAZAPINE 7.5MG  TABLETS] 90 tablet     Sig: TAKE 1 TABLET(7.5 MG) BY MOUTH AT BEDTIME     Psychiatry: Antidepressants - mirtazapine Passed - 08/10/2023  8:08 PM      Passed - Completed PHQ-2 or PHQ-9 in the last 360 days      Passed - Valid encounter within last 6 months    Recent Outpatient Visits           1 month ago Moderate recurrent major depression (HCC)   [place] [facility] at [facility], Melton Alar, [place]   3 months ago Moderate recurrent major depression [facility])   [facility] at [facility], Melton Alar, [place]   4 months ago Insomnia due to other mental disorder   [facility] at [facility], Melton Alar, [place]   5 months ago Insomnia due to other mental disorder   [facility] at [facility], Melton Alar, [place]   6 months ago Chronic diarrhea of unknown origin   [facility] at [facility], Melton Alar, [place]       Future Appointments             In 5 days Mordecai Maes, Melton Alar, PA [facility] Medicine at [facility], [facility]

## 2023-08-11 NOTE — Telephone Encounter (Signed)
Please review.  KP

## 2023-08-16 ENCOUNTER — Ambulatory Visit (INDEPENDENT_AMBULATORY_CARE_PROVIDER_SITE_OTHER): Payer: 59 | Admitting: Physician Assistant

## 2023-08-16 ENCOUNTER — Encounter: Payer: Self-pay | Admitting: Physician Assistant

## 2023-08-16 ENCOUNTER — Ambulatory Visit: Payer: 59 | Admitting: Physician Assistant

## 2023-08-16 VITALS — BP 136/84 | HR 101 | Temp 97.6°F | Ht 66.0 in | Wt 233.0 lb

## 2023-08-16 DIAGNOSIS — E782 Mixed hyperlipidemia: Secondary | ICD-10-CM

## 2023-08-16 DIAGNOSIS — E66812 Obesity, class 2: Secondary | ICD-10-CM

## 2023-08-16 DIAGNOSIS — J449 Chronic obstructive pulmonary disease, unspecified: Secondary | ICD-10-CM | POA: Diagnosis not present

## 2023-08-16 DIAGNOSIS — M25512 Pain in left shoulder: Secondary | ICD-10-CM

## 2023-08-16 DIAGNOSIS — M25511 Pain in right shoulder: Secondary | ICD-10-CM

## 2023-08-16 DIAGNOSIS — G8929 Other chronic pain: Secondary | ICD-10-CM

## 2023-08-16 DIAGNOSIS — I700 Atherosclerosis of aorta: Secondary | ICD-10-CM

## 2023-08-16 DIAGNOSIS — M810 Age-related osteoporosis without current pathological fracture: Secondary | ICD-10-CM

## 2023-08-16 DIAGNOSIS — F331 Major depressive disorder, recurrent, moderate: Secondary | ICD-10-CM

## 2023-08-16 DIAGNOSIS — I2510 Atherosclerotic heart disease of native coronary artery without angina pectoris: Secondary | ICD-10-CM | POA: Insufficient documentation

## 2023-08-16 DIAGNOSIS — Z6837 Body mass index (BMI) 37.0-37.9, adult: Secondary | ICD-10-CM

## 2023-08-16 DIAGNOSIS — M5134 Other intervertebral disc degeneration, thoracic region: Secondary | ICD-10-CM

## 2023-08-16 MED ORDER — CITALOPRAM HYDROBROMIDE 40 MG PO TABS
40.0000 mg | ORAL_TABLET | Freq: Every day | ORAL | 3 refills | Status: DC
Start: 2023-08-16 — End: 2024-05-15

## 2023-08-16 NOTE — Progress Notes (Signed)
Date:  08/16/2023   Name:  Kelsey Rose   DOB:  31-Jan-1961   MRN:  [id number]   Chief Complaint: COPD, Hyperlipidemia, and anxiety and depression   HPI Markiya presents for 25-month follow-up on chronic conditions including HLD, MDD, coronary atherosclerosis, active tobacco use, class II obesity, COPD, and chronic pain syndrome.    She is seeing pain management Dr. Laban Emperor with last visit there 06/29/2023. Was supposed to have a procedure this morning but it was postponed to tomorrow due to transportation.  She also saw Dr. Servando Snare 08/10/23 for chronic diarrhea with plan for colonoscopy in November; last one was in 2019 with polyps but normal mucosa.   She feels very depressed and has been out of Celexa for the last 4 or 5 days.  Requests a lift chair/recliner and lift bed due to pain sleeping shoulders.   States she does not feel well. Very tired and short of breath generally speaking. Still smoking but has reduced. She wants to keep today's visit brief since she has her procedure tomorrow morning.   Medication list has been reviewed and updated.  Current Meds  Medication Sig   albuterol (ACCUNEB) 1.25 MG/3ML nebulizer solution Take 1 ampule by nebulization every 6 (six) hours as needed for wheezing.   albuterol (PROVENTIL HFA;VENTOLIN HFA) 108 (90 Base) MCG/ACT inhaler Inhale 2 puffs into the lungs every 6 (six) hours as needed for wheezing or shortness of breath.   alendronate (FOSAMAX) 70 MG tablet Take 1 tablet (70 mg total) by mouth every 7 (seven) days. Take with a full glass of water on an empty stomach.   atorvastatin (LIPITOR) 20 MG tablet Take 1 tablet (20 mg total) by mouth daily.   lidocaine (LIDODERM) 5 % Place 1 patch onto the skin daily. Remove & Discard patch within 12 hours or as directed by MD   mirtazapine (REMERON) 7.5 MG tablet Take 1 tablet (7.5 mg total) by mouth at bedtime. For sleep and depression. May caused increased appetite and weight gain.   naproxen  sodium (ALEVE) 220 MG tablet Take 220 mg by mouth as needed.   nystatin powder Apply 1 Application topically 3 (three) times daily. PRN   ondansetron (ZOFRAN) 4 MG tablet Take 4 mg by mouth every 8 (eight) hours as needed for nausea or vomiting.   Tiotropium Bromide-Olodaterol (STIOLTO RESPIMAT) 2.5-2.5 MCG/ACT AERS Inhale 2 each into the lungs daily.   [DISCONTINUED] citalopram (CELEXA) 40 MG tablet Take 40 mg by mouth daily.   [DISCONTINUED] loperamide (IMODIUM A-D) 2 MG tablet Take 2 mg by mouth 4 (four) times daily as needed for diarrhea or loose stools.     Review of Systems  Constitutional:  Positive for fatigue. Negative for fever.  Respiratory:  Positive for shortness of breath. Negative for chest tightness.   Cardiovascular:  Negative for chest pain and palpitations.  Gastrointestinal:  Negative for abdominal pain.  Musculoskeletal:  Positive for arthralgias and back pain.  Psychiatric/Behavioral:  Positive for dysphoric mood and sleep disturbance.     Patient Active Problem List   Diagnosis Date Noted   Coronary atherosclerosis of native coronary artery 08/16/2023   Spondylosis without myelopathy or radiculopathy, lumbosacral region 06/28/2023   T12 wedge compression fracture, sequela 06/09/2023   DDD (degenerative disc disease), cervical 06/09/2023   COPD with chronic bronchitis (HCC) 06/09/2023   Osteoarthritis of hips (Bilateral) 06/09/2023   Cervical foraminal stenosis (Right: CC4-5, C5-6, C6-7, C7-T1) 06/09/2023   Cervical facet hypertrophy 06/09/2023  Cervical facet arthropathy 06/09/2023   Cervical facet joint pain 06/09/2023   Osteophytosis of shoulder (Left) 06/09/2023   Osteoarthritis of AC (acromioclavicular) joint (Left) 06/09/2023   Lumbar facet arthropathy 06/09/2023   Lumbar facet joint pain 06/09/2023   Osteopenia determined by x-ray 06/09/2023   Osteopenia of hips (Bilateral) 06/09/2023   Ligamentum flavum hypertrophy (L3-4, L4-5, L5-S1) 06/09/2023    DDD (degenerative disc disease), thoracic 06/09/2023   Lumbar intervertebral disc protrusion (Left: L3-4, L4-5) 06/09/2023   Lumbar foraminal stenosis (Left: L3-4, L5-S1) 06/09/2023   Abnormal x-ray of cervical spine 06/01/2023   Abnormal MRI, lumbar spine (06/17/2015) 05/24/2023   Chronic hip pain (4th area of Pain) (Bilateral) (L>R) 05/24/2023   Chronic shoulder pain (2ry area of Pain) (Bilateral) (L>R) 05/24/2023   History of total shoulder replacement (Right) 05/24/2023   Osteoarthritis involving multiple joints 05/24/2023   Chronic neck pain (3ry area of Pain) (Midline) 05/24/2023   Chronic pain syndrome 05/23/2023   Pharmacologic therapy 05/23/2023   Disorder of skeletal system 05/23/2023   Problems influencing health status 05/23/2023   Aortic atherosclerosis (HCC) 03/25/2023   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 03/25/2023   Osteoporosis 03/25/2023   Generalized anxiety disorder 02/19/2023   Insomnia due to other mental disorder 02/19/2023   Benign paroxysmal positional vertigo 04/28/2022   Chronic low back pain (1ry area of Pain) (Bilateral) (L>R) w/o sciatica 04/28/2022   Moderate recurrent major depression (HCC) 04/28/2022   History of vitamin D deficiency 04/28/2022   Chronic diarrhea 02/16/2022   Class 2 severe obesity with serious comorbidity and body mass index (BMI) of 37.0 to 37.9 in adult (HCC) 08/22/2015   DDD (degenerative disc disease), lumbosacral 08/22/2015   GERD (gastroesophageal reflux disease) 04/25/2015   Hyperlipemia 10/30/2014   Chronic obstructive pulmonary disease (HCC) 03/29/2014   Tobacco use disorder, continuous 03/29/2014   Asthma 06/05/2013   Pain in shoulder region after shoulder replacement (Right) 11/18/2012    Allergies  Allergen Reactions   Sulfa Antibiotics Swelling    Developed swelling in eyes when using Sulfa eye drops.   Iodinated Contrast Media Nausea Only   Penicillins Nausea And Vomiting    Got really sick, throwing up as child     Immunization History  Administered Date(s) Administered   Influenza-Unspecified 10/22/2015   Pneumococcal Polysaccharide-23 08/30/2014   Tdap 04/14/2023    Past Surgical History:  Procedure Laterality Date   ABDOMINAL HYSTERECTOMY  62 yrs old   JOINT REPLACEMENT Right  May 2015   Shoulder replacement   TONSILLECTOMY AND ADENOIDECTOMY  Age 9    Social History   Tobacco Use   Smoking status: Every Day    Current packs/day: 1.00    Average packs/day: 1 pack/day for 53.0 years (53.0 ttl pk-yrs)    Types: Cigarettes   Tobacco comments:    Smoking 0.25 ppd as of 04/14/23  Vaping Use   Vaping status: Never Used  Substance Use Topics   Alcohol use: No   Drug use: Not Currently    Family History  Problem Relation Age of Onset   Cirrhosis Father    Depression Sister    COPD Sister    Diabetes Daughter    Depression Sister    COPD Sister    Gallstones Mother    Dementia Mother    Osteoarthritis Mother    Heart attack Brother         08/16/2023    3:49 PM 06/14/2023    1:29 PM 04/14/2023    1:29 PM  03/19/2023    2:14 PM  GAD 7 : Generalized Anxiety Score  Nervous, Anxious, on Edge 3 0 0 2  Control/stop worrying 3 2 2 3   Worry too much - different things 3 2 2 3   Trouble relaxing 3 2 2 2   Restless 3 1 1 2   Easily annoyed or irritable 3 1 3 3   Afraid - awful might happen 0 1 0 1  Total GAD 7 Score 18 9 10 16   Anxiety Difficulty Extremely difficult Somewhat difficult Somewhat difficult Not difficult at all       08/16/2023    3:48 PM 06/14/2023    1:27 PM 06/09/2023    1:37 PM  Depression screen PHQ 2/9  Decreased Interest 3 2 0  Down, Depressed, Hopeless 3 2 0  PHQ - 2 Score 6 4 0  Altered sleeping 3 0   Tired, decreased energy 3 3   Change in appetite 3 3   Feeling bad or failure about yourself  3 0   Trouble concentrating 3 2   Moving slowly or fidgety/restless 3 2   Suicidal thoughts 0 0   PHQ-9 Score 24 14   Difficult doing work/chores Extremely  dIfficult Somewhat difficult     BP Readings from Last 3 Encounters:  08/16/23 136/84  08/10/23 133/82  06/29/23 133/65    Wt Readings from Last 3 Encounters:  08/16/23 233 lb (105.7 kg)  08/10/23 232 lb (105.2 kg)  06/14/23 235 lb (106.6 kg)    BP 136/84   Pulse (!) 101   Temp 97.6 F (36.4 C) (Oral)   Ht 5\' 6"  (1.676 m)   Wt 233 lb (105.7 kg)   SpO2 94%   BMI 37.61 kg/m   Physical Exam Vitals and nursing note reviewed.  Constitutional:      Appearance: Normal appearance.  Neck:     Vascular: No carotid bruit.  Cardiovascular:     Rate and Rhythm: Normal rate and regular rhythm.     Heart sounds: Heart sounds are distant. No murmur heard.    No friction rub. No gallop.  Pulmonary:     Effort: Pulmonary effort is normal.     Breath sounds: Wheezing present.  Abdominal:     General: There is no distension.  Musculoskeletal:        General: Normal range of motion.  Skin:    General: Skin is warm and dry.  Neurological:     Mental Status: She is alert and oriented to person, place, and time.     Gait: Gait is intact.  Psychiatric:        Mood and Affect: Mood and affect normal.     Recent Labs     Component Value Date/Time   NA 144 05/24/2023 1425   K 4.4 05/24/2023 1425   CL 105 05/24/2023 1425   CO2 24 01/19/2023 1608   GLUCOSE 112 (H) 05/24/2023 1425   BUN 6 (L) 05/24/2023 1425   CREATININE 0.77 05/24/2023 1425   CALCIUM 9.5 05/24/2023 1425   PROT 7.0 05/24/2023 1425   ALBUMIN 4.0 05/24/2023 1425   AST 14 05/24/2023 1425   ALT 13 01/19/2023 1608   ALKPHOS 121 05/24/2023 1425   BILITOT <0.2 05/24/2023 1425    Lab Results  Component Value Date   WBC 7.2 01/19/2023   HGB 15.3 01/19/2023   HCT 44.5 01/19/2023   MCV 91 01/19/2023   PLT 234 01/19/2023   Lab Results  Component Value Date  HGBA1C 5.5 12/20/2014   Lab Results  Component Value Date   CHOL 268 (H) 01/19/2023   HDL 48 01/19/2023   LDLCALC 168 (H) 01/19/2023   TRIG 278 (H)  01/19/2023   CHOLHDL 5.6 (H) 01/19/2023   Lab Results  Component Value Date   TSH 1.810 01/19/2023     Assessment and Plan:  1. Mixed hyperlipidemia Check lipids today and adjust statin dose accordingly - Lipid panel  2. Aortic atherosclerosis (HCC) Check lipids today and adjust statin dose accordingly - Lipid panel  3. Chronic obstructive pulmonary disease, unspecified COPD type (HCC) Advised smoking cessation.  Will attempt prescription for DME lift bed and recliner - For home use only DME Hospital bed - PR PWR SEAT RECLINE  4. Class 2 severe obesity with serious comorbidity and body mass index (BMI) of 37.0 to 37.9 in adult, unspecified obesity type (HCC) Check lipids, weight stable - Lipid panel  5. Moderate recurrent major depression (HCC) Refill citalopram as below.  Emphasized the importance of avoiding sudden cessation of this medication in the future especially at this dose. - citalopram (CELEXA) 40 MG tablet; Take 1 tablet (40 mg total) by mouth daily.  Dispense: 90 tablet; Refill: 3  6. Age-related osteoporosis without current pathological fracture - For home use only DME Hospital bed  7. DDD (degenerative disc disease), thoracic - For home use only DME Hospital bed - PR PWR SEAT RECLINE  8. Chronic shoulder pain (2ry area of Pain) (Bilateral) (L>R) - For home use only DME Hospital bed - PR PWR SEAT RECLINE     Return in about 4 weeks (around 09/13/2023) for OV f/u chronic conditions.    Alvester Morin, PA-C, DMSc, Nutritionist [facility] [phone]

## 2023-08-16 NOTE — Progress Notes (Unsigned)
PROVIDER NOTE: Interpretation of information contained herein should be left to medically-trained personnel. Specific patient instructions are provided elsewhere under "Patient Instructions" section of medical record. This document was created in part using STT-dictation technology, any transcriptional errors that may result from this process are unintentional.  Patient: Kelsey Rose Type: Established DOB: Apr 12, 1961 MRN: [id number] PCP: Remo Lipps, PA  Service: Procedure DOS: 08/17/2023 Setting: Ambulatory Location: Ambulatory outpatient facility Delivery: Face-to-face Provider: Oswaldo Done, MD Specialty: Interventional Pain Management Specialty designation: 09 Location: Outpatient facility Ref. Prov.: Remo Lipps, PA       Interventional Therapy   Procedure: Lumbar Facet, Medial Branch Block(s) #1  Laterality: Bilateral  Level: T12, L1, L2, L3, L4, L5, and S1 Medial Branch Level(s). Injecting these levels blocks the L1-2, L2-3, L3-4, L4-5, and L5-S1 lumbar facet joints. Imaging: Fluoroscopic guidance         Anesthesia: Local anesthesia (1-2% Lidocaine) Anxiolysis:    ***       ***            Sedation:                         DOS: 08/17/2023 Performed by: Oswaldo Done, MD  Primary Purpose: Diagnostic/Therapeutic Indications: Low back pain severe enough to impact quality of life or function. No diagnosis found. NAS-11 Pain score:   Pre-procedure:  /10   Post-procedure:  /10     Position / Prep / Materials:  Position: Prone  Prep solution: DuraPrep (Iodine Povacrylex [0.7% available iodine] and Isopropyl Alcohol, 74% w/w) Area Prepped: Posterolateral Lumbosacral Spine (Wide prep: From the lower border of the scapula down to the end of the tailbone and from flank to flank.)  Materials:  Tray: Block Needle(s):  Type: Spinal  Gauge (G): 22  Length: 5-in Qty:    ***      H&P (Pre-op Assessment):  Kelsey Rose is a 62 y.o. (year old), female  patient, seen today for interventional treatment. She  has a past surgical history that includes Tonsillectomy and adenoidectomy (Age 60); Abdominal hysterectomy (62 yrs old); and Joint replacement (Right,  May 2015). Kelsey Rose has a current medication list which includes the following prescription(s): albuterol, albuterol, alendronate, atorvastatin, citalopram, lidocaine, loperamide, mirtazapine, naproxen sodium, nystatin, ondansetron, and stiolto respimat. Her primarily concern today is the No chief complaint on file.  Initial Vital Signs:  Pulse/HCG Rate:    Temp:   Resp:   BP:   SpO2:    BMI: Estimated body mass index is 37.45 kg/m as calculated from the following:   Height as of 08/10/23: 5\' 6"  (1.676 m).   Weight as of 08/10/23: 232 lb (105.2 kg).  Risk Assessment: Allergies: Reviewed. She is allergic to sulfa antibiotics, iodinated contrast media, and penicillins.  Allergy Precautions: None required Coagulopathies: Reviewed. None identified.  Blood-thinner therapy: None at this time Active Infection(s): Reviewed. None identified. [name] is afebrile  Site Confirmation: Kelsey Rose was asked to confirm the procedure and laterality before marking the site Procedure checklist: Completed Consent: Before the procedure and under the influence of no sedative(s), amnesic(s), or anxiolytics, the patient was informed of the treatment options, risks and possible complications. To fulfill our ethical and legal obligations, as recommended by the American Medical Association's Code of Ethics, I have informed the patient of my clinical impression; the nature and purpose of the treatment or procedure; the risks, benefits, and possible complications of the intervention; the alternatives, including  doing nothing; the risk(s) and benefit(s) of the alternative treatment(s) or procedure(s); and the risk(s) and benefit(s) of doing nothing. The patient was provided information about the general risks and  possible complications associated with the procedure. These may include, but are not limited to: failure to achieve desired goals, infection, bleeding, organ or nerve damage, allergic reactions, paralysis, and death. In addition, the patient was informed of those risks and complications associated to Spine-related procedures, such as failure to decrease pain; infection (i.e.: Meningitis, epidural or intraspinal abscess); bleeding (i.e.: epidural hematoma, subarachnoid hemorrhage, or any other type of intraspinal or peri-dural bleeding); organ or nerve damage (i.e.: Any type of peripheral nerve, nerve root, or spinal cord injury) with subsequent damage to sensory, motor, and/or autonomic systems, resulting in permanent pain, numbness, and/or weakness of one or several areas of the body; allergic reactions; (i.e.: anaphylactic reaction); and/or death. Furthermore, the patient was informed of those risks and complications associated with the medications. These include, but are not limited to: allergic reactions (i.e.: anaphylactic or anaphylactoid reaction(s)); adrenal axis suppression; blood sugar elevation that in diabetics may result in ketoacidosis or comma; water retention that in patients with history of congestive heart failure may result in shortness of breath, pulmonary edema, and decompensation with resultant heart failure; weight gain; swelling or edema; medication-induced neural toxicity; particulate matter embolism and blood vessel occlusion with resultant organ, and/or nervous system infarction; and/or aseptic necrosis of one or more joints. Finally, the patient was informed that Medicine is not an exact science; therefore, there is also the possibility of unforeseen or unpredictable risks and/or possible complications that may result in a catastrophic outcome. The patient indicated having understood very clearly. We have given the patient no guarantees and we have made no promises. Enough time was  given to the patient to ask questions, all of which were answered to the patient's satisfaction. Kelsey Rose has indicated that she wanted to continue with the procedure. Attestation: I, the ordering provider, attest that I have discussed with the patient the benefits, risks, side-effects, alternatives, likelihood of achieving goals, and potential problems during recovery for the procedure that I have provided informed consent. Date  Time: {CHL [facility]-PAIN TIME CHOICES:[id number]}   Pre-Procedure Preparation:  Monitoring: As per clinic protocol. Respiration, ETCO2, SpO2, BP, heart rate and rhythm monitor placed and checked for adequate function Safety Precautions: Patient was assessed for positional comfort and pressure points before starting the procedure. Time-out: I initiated and conducted the "Time-out" before starting the procedure, as per protocol. The patient was asked to participate by confirming the accuracy of the "Time Out" information. Verification of the correct person, site, and procedure were performed and confirmed by me, the nursing staff, and the patient. "Time-out" conducted as per Joint Commission's Universal Protocol (UP.01.01.01). Time:   Start Time:   hrs.  Description of Procedure:          Laterality: (see above) Targeted Levels: (see above)  Safety Precautions: Aspiration looking for blood return was conducted prior to all injections. At no point did we inject any substances, as a needle was being advanced. Before injecting, the patient was told to immediately notify me if she was experiencing any new onset of "ringing in the ears, or metallic taste in the mouth". No attempts were made at seeking any paresthesias. Safe injection practices and needle disposal techniques used. Medications properly checked for expiration dates. SDV (single dose vial) medications used. After the completion of the procedure, all disposable equipment used was discarded in  the proper designated medical  waste containers. Local Anesthesia: Protocol guidelines were followed. The patient was positioned over the fluoroscopy table. The area was prepped in the usual manner. The time-out was completed. The target area was identified using fluoroscopy. A 12-in long, straight, sterile hemostat was used with fluoroscopic guidance to locate the targets for each level blocked. Once located, the skin was marked with an approved surgical skin marker. Once all sites were marked, the skin (epidermis, dermis, and hypodermis), as well as deeper tissues (fat, connective tissue and muscle) were infiltrated with a small amount of a short-acting local anesthetic, loaded on a 10cc syringe with a 25G, 1.5-in  Needle. An appropriate amount of time was allowed for local anesthetics to take effect before proceeding to the next step. Local Anesthetic: Lidocaine 2.0% The unused portion of the local anesthetic was discarded in the proper designated containers. Technical description of process:  L2 Medial Branch Nerve Block (MBB): The target area for the L2 medial branch is at the junction of the postero-lateral aspect of the superior articular process and the superior, posterior, and medial edge of the transverse process of L3. Under fluoroscopic guidance, a Quincke needle was inserted until contact was made with os over the superior postero-lateral aspect of the pedicular shadow (target area). After negative aspiration for blood, 0.5 mL of the nerve block solution was injected without difficulty or complication. The needle was removed intact. L3 Medial Branch Nerve Block (MBB): The target area for the L3 medial branch is at the junction of the postero-lateral aspect of the superior articular process and the superior, posterior, and medial edge of the transverse process of L4. Under fluoroscopic guidance, a Quincke needle was inserted until contact was made with os over the superior postero-lateral aspect of the pedicular shadow (target  area). After negative aspiration for blood, 0.5 mL of the nerve block solution was injected without difficulty or complication. The needle was removed intact. L4 Medial Branch Nerve Block (MBB): The target area for the L4 medial branch is at the junction of the postero-lateral aspect of the superior articular process and the superior, posterior, and medial edge of the transverse process of L5. Under fluoroscopic guidance, a Quincke needle was inserted until contact was made with os over the superior postero-lateral aspect of the pedicular shadow (target area). After negative aspiration for blood, 0.5 mL of the nerve block solution was injected without difficulty or complication. The needle was removed intact. L5 Medial Branch Nerve Block (MBB): The target area for the L5 medial branch is at the junction of the postero-lateral aspect of the superior articular process and the superior, posterior, and medial edge of the sacral ala. Under fluoroscopic guidance, a Quincke needle was inserted until contact was made with os over the superior postero-lateral aspect of the pedicular shadow (target area). After negative aspiration for blood, 0.5 mL of the nerve block solution was injected without difficulty or complication. The needle was removed intact. S1 Medial Branch Nerve Block (MBB): The target area for the S1 medial branch is at the posterior and inferior 6 o'clock position of the L5-S1 facet joint. Under fluoroscopic guidance, the Quincke needle inserted for the L5 MBB was redirected until contact was made with os over the inferior and postero aspect of the sacrum, at the 6 o' clock position under the L5-S1 facet joint (Target area). After negative aspiration for blood, 0.5 mL of the nerve block solution was injected without difficulty or complication. The needle was removed intact.  Once the entire procedure was completed, the treated area was cleaned, making sure to leave some of the prepping solution back to  take advantage of its long term bactericidal properties.         Illustration of the posterior view of the lumbar spine and the posterior neural structures. Laminae of L2 through S1 are labeled. DPRL5, dorsal primary ramus of L5; DPRS1, dorsal primary ramus of S1; DPR3, dorsal primary ramus of L3; FJ, facet (zygapophyseal) joint L3-L4; I, inferior articular process of L4; LB1, lateral branch of dorsal primary ramus of L1; IAB, inferior articular branches from L3 medial branch (supplies L4-L5 facet joint); IBP, intermediate branch plexus; MB3, medial branch of dorsal primary ramus of L3; NR3, third lumbar nerve root; S, superior articular process of L5; SAB, superior articular branches from L4 (supplies L4-5 facet joint also); TP3, transverse process of L3.   Facet Joint Innervation (* possible contribution)  L1-2 T12, L1 (L2*)  Medial Branch  L2-3 L1, L2 (L3*)         "          "  L3-4 L2, L3 (L4*)         "          "  L4-5 L3, L4 (L5*)         "          "  L5-S1 L4, L5, S1          "          "    There were no vitals filed for this visit.   End Time:   hrs.  Imaging Guidance (Spinal):          Type of Imaging Technique: Fluoroscopy Guidance (Spinal) Indication(s): Assistance in needle guidance and placement for procedures requiring needle placement in or near specific anatomical locations not easily accessible without such assistance. Exposure Time: Please see nurses notes. Contrast: None used. Fluoroscopic Guidance: I was personally present during the use of fluoroscopy. "Tunnel Vision Technique" used to obtain the best possible view of the target area. Parallax error corrected before commencing the procedure. "Direction-depth-direction" technique used to introduce the needle under continuous pulsed fluoroscopy. Once target was reached, antero-posterior, oblique, and lateral fluoroscopic projection used confirm needle placement in all planes. Images permanently stored in  EMR. Interpretation: No contrast injected. I personally interpreted the imaging intraoperatively. Adequate needle placement confirmed in multiple planes. Permanent images saved into the patient's record.  Post-operative Assessment:  Post-procedure Vital Signs:  Pulse/HCG Rate:    Temp:   Resp:   BP:   SpO2:    EBL: None  Complications: No immediate post-treatment complications observed by team, or reported by patient.  Note: The patient tolerated the entire procedure well. A repeat set of vitals were taken after the procedure and the patient was kept under observation following institutional policy, for this type of procedure. Post-procedural neurological assessment was performed, showing return to baseline, prior to discharge. The patient was provided with post-procedure discharge instructions, including a section on how to identify potential problems. Should any problems arise concerning this procedure, the patient was given instructions to immediately contact us, at any time, without hesitation. In any case, we plan to contact the patient by telephone for a follow-up status report regarding this interventional procedure.  Comments:  No additional relevant information.  Plan of Care (POC)  Orders:  No orders of the defined types were placed in this encounter.  Chronic Opioid Analgesic:  None MME/day: 0 mg/day   Medications ordered for procedure: No orders of the defined types were placed in this encounter.  Medications administered: Jawana F. Ey had no medications administered during this visit.  See the medical record for exact dosing, route, and time of administration.  Follow-up plan:   No follow-ups on file.       Interventional Therapies  Risk Factors  Considerations  Medical Comorbidities:  COPD  BA  GERD  chronic smoker  paroxysmal vertigo  anxiety  depression     Planned  Pending:   Diagnostic bilateral lumbar facet MBB #1    Under consideration:    Diagnostic bilateral lumbar facet MBB #1  Possible candidate for lumbar facet RFA    Completed:   None at this time   Therapeutic  Palliative (PRN) options:   None established   Completed by other providers:   Diagnostic left L3-4, L4-5 IA facet joint inj. x1 (01/28/2016) by Windell Norfolk, MD (PMR) (6/10 to 0/10)  Diagnostic/therapeutic left glenohumeral (shoulder) joint inj. x1 (05/19/2021) by Dian Situ, MD         Recent Visits Date Type Provider Dept  06/29/23 Procedure visit Delano Metz, MD [facility]  06/09/23 Office Visit Delano Metz, MD [facility]  05/24/23 Office Visit Delano Metz, MD [facility]  Showing recent visits within past 90 days and meeting all other requirements Future Appointments Date Type Provider Dept  08/17/23 Appointment Delano Metz, MD [facility]  Showing future appointments within next 90 days and meeting all other requirements  Disposition: Discharge home  Discharge (Date  Time): 08/17/2023;   hrs.   Primary Care Physician: Remo Lipps, PA Location: [facility] Note by: Oswaldo Done, MD (TTS technology used. I apologize for any typographical errors that were not detected and corrected.) Date: 08/17/2023; Time: 12:32 PM  Disclaimer:  Medicine is not an Visual merchandiser. The only guarantee in medicine is that nothing is guaranteed. It is important to note that the decision to proceed with this intervention was based on the information collected from the patient. The Data and conclusions were drawn from the patient's questionnaire, the interview, and the physical examination. Because the information was provided in large part by the patient, it cannot be guaranteed that it has not been purposely or unconsciously manipulated. Every effort has been made to obtain as much relevant data as possible for this evaluation. It is important to  note that the conclusions that lead to this procedure are derived in large part from the available data. Always take into account that the treatment will also be dependent on availability of resources and existing treatment guidelines, considered by other Pain Management Practitioners as being common knowledge and practice, at the time of the intervention. For Medico-Legal purposes, it is also important to point out that variation in procedural techniques and pharmacological choices are the acceptable norm. The indications, contraindications, technique, and results of the above procedure should only be interpreted and judged by a Board-Certified Interventional Pain Specialist with extensive familiarity and expertise in the same exact procedure and technique.

## 2023-08-17 ENCOUNTER — Ambulatory Visit: Payer: 59 | Attending: Pain Medicine | Admitting: Pain Medicine

## 2023-08-17 ENCOUNTER — Ambulatory Visit
Admission: RE | Admit: 2023-08-17 | Discharge: 2023-08-17 | Disposition: A | Discharge status: Home / Self Care | Payer: 59 | Source: Ambulatory Visit | Attending: Pain Medicine | Admitting: Pain Medicine

## 2023-08-17 ENCOUNTER — Encounter: Payer: Self-pay | Admitting: Pain Medicine

## 2023-08-17 ENCOUNTER — Telehealth: Payer: Self-pay

## 2023-08-17 VITALS — BP 123/78 | HR 82 | Temp 97.3°F | Resp 17 | Ht 66.0 in | Wt 232.5 lb

## 2023-08-17 DIAGNOSIS — M545 Low back pain, unspecified: Secondary | ICD-10-CM | POA: Diagnosis present

## 2023-08-17 DIAGNOSIS — M47816 Spondylosis without myelopathy or radiculopathy, lumbar region: Secondary | ICD-10-CM | POA: Diagnosis present

## 2023-08-17 DIAGNOSIS — M51379 Other intervertebral disc degeneration, lumbosacral region without mention of lumbar back pain or lower extremity pain: Secondary | ICD-10-CM | POA: Diagnosis present

## 2023-08-17 DIAGNOSIS — M47817 Spondylosis without myelopathy or radiculopathy, lumbosacral region: Secondary | ICD-10-CM | POA: Diagnosis not present

## 2023-08-17 DIAGNOSIS — G8929 Other chronic pain: Secondary | ICD-10-CM | POA: Diagnosis present

## 2023-08-17 DIAGNOSIS — Z91041 Radiographic dye allergy status: Secondary | ICD-10-CM | POA: Diagnosis present

## 2023-08-17 DIAGNOSIS — M5459 Other low back pain: Secondary | ICD-10-CM | POA: Insufficient documentation

## 2023-08-17 DIAGNOSIS — S22080S Wedge compression fracture of T11-T12 vertebra, sequela: Secondary | ICD-10-CM | POA: Diagnosis present

## 2023-08-17 DIAGNOSIS — R937 Abnormal findings on diagnostic imaging of other parts of musculoskeletal system: Secondary | ICD-10-CM | POA: Insufficient documentation

## 2023-08-17 LAB — LIPID PANEL
Chol/HDL Ratio: 3.4 {ratio} (ref 0.0–4.4)
Cholesterol, Total: 168 mg/dL (ref 100–199)
HDL: 49 mg/dL (ref >39)
LDL Chol Calc (NIH): 88 mg/dL (ref 0–99)
Triglycerides: 179 mg/dL — ABNORMAL HIGH (ref 0–149)
VLDL Cholesterol Cal: 31 mg/dL (ref 5–40)

## 2023-08-17 MED ORDER — ROPIVACAINE HCL 2 MG/ML IJ SOLN
18.0000 mL | Freq: Once | INTRAMUSCULAR | Status: AC
  Administered 2023-08-17: 18 mL via PERINEURAL

## 2023-08-17 MED ORDER — FENTANYL CITRATE (PF) 100 MCG/2ML IJ SOLN
INTRAMUSCULAR | Status: AC
  Filled 2023-08-17: qty 2

## 2023-08-17 MED ORDER — FENTANYL CITRATE (PF) 100 MCG/2ML IJ SOLN
25.0000 ug | INTRAMUSCULAR | Status: DC | PRN
  Administered 2023-08-17: 50 ug via INTRAVENOUS

## 2023-08-17 MED ORDER — TRIAMCINOLONE ACETONIDE 40 MG/ML IJ SUSP
INTRAMUSCULAR | Status: AC
  Filled 2023-08-17: qty 2

## 2023-08-17 MED ORDER — TRIAMCINOLONE ACETONIDE 40 MG/ML IJ SUSP
80.0000 mg | Freq: Once | INTRAMUSCULAR | Status: AC
  Administered 2023-08-17: 80 mg

## 2023-08-17 MED ORDER — LIDOCAINE HCL 2 % IJ SOLN
20.0000 mL | Freq: Once | INTRAMUSCULAR | Status: AC
  Administered 2023-08-17: 400 mg

## 2023-08-17 MED ORDER — MIDAZOLAM HCL 5 MG/5ML IJ SOLN
INTRAMUSCULAR | Status: AC
  Filled 2023-08-17: qty 5

## 2023-08-17 MED ORDER — ROPIVACAINE HCL 2 MG/ML IJ SOLN
INTRAMUSCULAR | Status: AC
  Filled 2023-08-17: qty 20

## 2023-08-17 MED ORDER — LIDOCAINE HCL 2 % IJ SOLN
INTRAMUSCULAR | Status: AC
  Filled 2023-08-17: qty 20

## 2023-08-17 MED ORDER — PENTAFLUOROPROP-TETRAFLUOROETH EX AERO
INHALATION_SPRAY | Freq: Once | CUTANEOUS | Status: AC
  Administered 2023-08-17: 30 via TOPICAL
  Filled 2023-08-17: qty 30

## 2023-08-17 MED ORDER — LACTATED RINGERS IV SOLN: Freq: Once | INTRAVENOUS | Status: AC

## 2023-08-17 MED ORDER — MIDAZOLAM HCL 5 MG/5ML IJ SOLN
0.5000 mg | Freq: Once | INTRAMUSCULAR | Status: AC
  Administered 2023-08-17: 2 mg via INTRAVENOUS

## 2023-08-17 NOTE — Progress Notes (Signed)
Safety precautions to be maintained throughout the outpatient stay will include: orient to surroundings, keep bed in low position, maintain call bell within reach at all times, provide assistance with transfer out of bed and ambulation.  

## 2023-08-17 NOTE — Telephone Encounter (Signed)
Called pt let her know that she would need to go to a medical supply store to see if she can order a chair that lifts. Told pt we was able to put in a order for the bed. Told pt that she can go to or call the location where we placed the order for the bed. Told pt to call [facility]. Pt is aware of the location and stated she would call or go up there.  KP

## 2023-08-17 NOTE — Patient Instructions (Signed)

## 2023-08-17 NOTE — Telephone Encounter (Signed)
Barbara Cower from [facility], says they dont do hospital beds there, pt would ned to use [facility]

## 2023-08-18 ENCOUNTER — Telehealth: Payer: Self-pay

## 2023-08-18 NOTE — Telephone Encounter (Signed)
Post procedure follow up.  Patient states she is doing well.   ?

## 2023-08-18 NOTE — Telephone Encounter (Signed)
Printed and sent to [facility].  KP

## 2023-08-31 ENCOUNTER — Encounter: Payer: Self-pay | Admitting: Pain Medicine

## 2023-08-31 ENCOUNTER — Ambulatory Visit: Payer: 59 | Attending: Pain Medicine | Admitting: Pain Medicine

## 2023-08-31 VITALS — BP 131/72 | HR 91 | Temp 96.8°F | Ht 66.0 in | Wt 232.0 lb

## 2023-08-31 DIAGNOSIS — M5459 Other low back pain: Secondary | ICD-10-CM | POA: Diagnosis present

## 2023-08-31 DIAGNOSIS — Z09 Encounter for follow-up examination after completed treatment for conditions other than malignant neoplasm: Secondary | ICD-10-CM | POA: Diagnosis not present

## 2023-08-31 DIAGNOSIS — M47816 Spondylosis without myelopathy or radiculopathy, lumbar region: Secondary | ICD-10-CM | POA: Diagnosis not present

## 2023-08-31 DIAGNOSIS — M545 Low back pain, unspecified: Secondary | ICD-10-CM | POA: Insufficient documentation

## 2023-08-31 DIAGNOSIS — G8929 Other chronic pain: Secondary | ICD-10-CM | POA: Diagnosis present

## 2023-08-31 NOTE — Patient Instructions (Addendum)
______________________________________________________________________    General Risks and Possible Complications  Patient Responsibilities: It is important that you read this as it is part of your informed consent. It is our duty to inform you of the risks and possible complications associated with treatments offered to you. It is your responsibility as a patient to read this and to ask questions about anything that is not clear or that you believe was not covered in this document.  Patient's Rights: You have the right to refuse treatment. You also have the right to change your mind, even after initially having agreed to have the treatment done. However, under this last option, if you wait until the last second to change your mind, you may be charged for the materials used up to that point.  Introduction: Medicine is not an Visual merchandiser. Everything in Medicine, including the lack of treatment(s), carries the potential for danger, harm, or loss (which is by definition: Risk). In Medicine, a complication is a secondary problem, condition, or disease that can aggravate an already existing one. All treatments carry the risk of possible complications. The fact that a side effects or complications occurs, does not imply that the treatment was conducted incorrectly. It must be clearly understood that these can happen even when everything is done following the highest safety standards.  No treatment: You can choose not to proceed with the proposed treatment alternative. The "PRO(s)" would include: avoiding the risk of complications associated with the therapy. The "CON(s)" would include: not getting any of the treatment benefits. These benefits fall under one of three categories: diagnostic; therapeutic; and/or palliative. Diagnostic benefits include: getting information which can ultimately lead to improvement of the disease or symptom(s). Therapeutic benefits are those associated with the successful  treatment of the disease. Finally, palliative benefits are those related to the decrease of the primary symptoms, without necessarily curing the condition (example: decreasing the pain from a flare-up of a chronic condition, such as incurable terminal cancer).  General Risks and Complications: These are associated to most interventional treatments. They can occur alone, or in combination. They fall under one of the following six (6) categories: no benefit or worsening of symptoms; bleeding; infection; nerve damage; allergic reactions; and/or death. No benefits or worsening of symptoms: In Medicine there are no guarantees, only probabilities. No healthcare provider can ever guarantee that a medical treatment will work, they can only state the probability that it may. Furthermore, there is always the possibility that the condition may worsen, either directly, or indirectly, as a consequence of the treatment. Bleeding: This is more common if the patient is taking a blood thinner, either prescription or over the counter (example: Goody Powders, Fish oil, Aspirin, Garlic, etc.), or if suffering a condition associated with impaired coagulation (example: Hemophilia, cirrhosis of the liver, low platelet counts, etc.). However, even if you do not have one on these, it can still happen. If you have any of these conditions, or take one of these drugs, make sure to notify your treating physician. Infection: This is more common in patients with a compromised immune system, either due to disease (example: diabetes, cancer, human immunodeficiency virus [HIV], etc.), or due to medications or treatments (example: therapies used to treat cancer and rheumatological diseases). However, even if you do not have one on these, it can still happen. If you have any of these conditions, or take one of these drugs, make sure to notify your treating physician. Nerve Damage: This is more common when the treatment is  an invasive one, but it  can also happen with the use of medications, such as those used in the treatment of cancer. The damage can occur to small secondary nerves, or to large primary ones, such as those in the spinal cord and brain. This damage may be temporary or permanent and it may lead to impairments that can range from temporary numbness to permanent paralysis and/or brain death. Allergic Reactions: Any time a substance or material comes in contact with our body, there is the possibility of an allergic reaction. These can range from a mild skin rash (contact dermatitis) to a severe systemic reaction (anaphylactic reaction), which can result in death. Death: In general, any medical intervention can result in death, most of the time due to an unforeseen complication. ______________________________________________________________________     Facet Blocks Patient Information  Description: The facets are joints in the spine between the vertebrae.  Like any joints in the body, facets can become irritated and painful.  Arthritis can also effect the facets.  By injecting steroids and local anesthetic in and around these joints, we can temporarily block the nerve supply to them.  Steroids act directly on irritated nerves and tissues to reduce selling and inflammation which often leads to decreased pain.  Facet blocks may be done anywhere along the spine from the neck to the low back depending upon the location of your pain.   After numbing the skin with local anesthetic (like Novocaine), a small needle is passed onto the facet joints under x-ray guidance.  You may experience a sensation of pressure while this is being done.  The entire block usually lasts about 15-25 minutes.   Conditions which may be treated by facet blocks:  Low back/buttock pain Neck/shoulder pain Certain types of headaches  Preparation for the injection:  Do not eat any solid food or dairy products within 8 hours of your appointment. You may drink  clear liquid up to 3 hours before appointment.  Clear liquids include water, black coffee, juice or soda.  No milk or cream please. You may take your regular medication, including pain medications, with a sip of water before your appointment.  Diabetics should hold regular insulin (if taken separately) and take 1/2 normal NPH dose the morning of the procedure.  Carry some sugar containing items with you to your appointment. A driver must accompany you and be prepared to drive you home after your procedure. Bring all your current medications with you. An IV may be inserted and sedation may be given at the discretion of the physician. A blood pressure cuff, EKG and other monitors will often be applied during the procedure.  Some patients may need to have extra oxygen administered for a short period. You will be asked to provide medical information, including your allergies and medications, prior to the procedure.  We must know immediately if you are taking blood thinners (like Coumadin/Warfarin) or if you are allergic to IV iodine contrast (dye).  We must know if you could possible be pregnant.  Possible side-effects:  Bleeding from needle site Infection (rare, may require surgery) Nerve injury (rare) Numbness & tingling (temporary) Difficulty urinating (rare, temporary) Spinal headache (a headache worse with upright posture) Light-headedness (temporary) Pain at injection site (serveral days) Decreased blood pressure (rare, temporary) Weakness in arm/leg (temporary) Pressure sensation in back/neck (temporary)   Call if you experience:  Fever/chills associated with headache or increased back/neck pain Headache worsened by an upright position New onset, weakness or numbness of an  extremity below the injection site Hives or difficulty breathing (go to the emergency room) Inflammation or drainage at the injection site(s) Severe back/neck pain greater than usual New symptoms which are  concerning to you  Please note:  Although the local anesthetic injected can often make your back or neck feel good for several hours after the injection, the pain will likely return. It takes 3-7 days for steroids to work.  You may not notice any pain relief for at least one week.  If effective, we will often do a series of 2-3 injections spaced 3-6 weeks apart to maximally decrease your pain.  After the initial series, you may be a candidate for a more permanent nerve block of the facets.  If you have any questions, please call [phone] [facility] ______________________________________________________________________    Procedure instructions  Stop blood-thinners  Do not eat or drink fluids (other than water) for 6 hours before your procedure  No water for 2 hours before your procedure  Take your blood pressure medicine with a sip of water  Arrive 30 minutes before your appointment  If sedation is planned, bring suitable driver. Pennie Banter, Benedetto Goad, & public transportation are NOT APPROVED)  Carefully read the "Preparing for your procedure" detailed instructions  If you have questions call us at [phone]  ______________________________________________________________________      ______________________________________________________________________    Preparing for your procedure  Appointments: If you think you may not be able to keep your appointment, call 24-48 hours in advance to cancel. We need time to make it available to others.  During your procedure appointment there will be: No Prescription Refills. No disability issues to discussed. No medication changes or discussions.  Instructions: Food intake: Avoid eating anything solid for at least 8 hours prior to your procedure. Clear liquid intake: You may take clear liquids such as water up to 2 hours prior to your procedure. (No carbonated drinks. No soda.) Transportation:  Unless otherwise stated by your physician, bring a driver. (Driver cannot be a Market researcher, Pharmacist, community, or any other form of public transportation.) Morning Medicines: Except for blood thinners, take all of your other morning medications with a sip of water. Make sure to take your heart and blood pressure medicines. If your blood pressure's lower number is above 100, the case will be rescheduled. Blood thinners: Make sure to stop your blood thinners as instructed.  If you take a blood thinner, but were not instructed to stop it, call our office [phone] and ask to talk to a nurse. Not stopping a blood thinner prior to certain procedures could lead to serious complications. Diabetics on insulin: Notify the staff so that you can be scheduled 1st case in the morning. If your diabetes requires high dose insulin, take only  of your normal insulin dose the morning of the procedure and notify the staff that you have done so. Preventing infections: Shower with an antibacterial soap the morning of your procedure.  Build-up your immune system: Take 1000 mg of Vitamin C with every meal (3 times a day) the day prior to your procedure. Antibiotics: Inform the nursing staff if you are taking any antibiotics or if you have any conditions that may require antibiotics prior to procedures. (Example: recent joint implants)   Pregnancy: If you are pregnant make sure to notify the nursing staff. Not doing so may result in injury to the fetus, including death.  Sickness: If you have a cold, fever, or any active  infections, call and cancel or reschedule your procedure. Receiving steroids while having an infection may result in complications. Arrival: You must be in the facility at least 30 minutes prior to your scheduled procedure. Tardiness: Your scheduled time is also the cutoff time. If you do not arrive at least 15 minutes prior to your procedure, you will be rescheduled.  Children: Do not bring any children with you. Make  arrangements to keep them home. Dress appropriately: There is always a possibility that your clothing may get soiled. Avoid long dresses. Valuables: Do not bring any jewelry or valuables.  Reasons to call and reschedule or cancel your procedure: (Following these recommendations will minimize the risk of a serious complication.) Surgeries: Avoid having procedures within 2 weeks of any surgery. (Avoid for 2 weeks before or after any surgery). Flu Shots: Avoid having procedures within 2 weeks of a flu shots or . (Avoid for 2 weeks before or after immunizations). Barium: Avoid having a procedure within 7-10 days after having had a radiological study involving the use of radiological contrast. (Myelograms, Barium swallow or enema study). Heart attacks: Avoid any elective procedures or surgeries for the initial 6 months after a "Myocardial Infarction" (Heart Attack). Blood thinners: It is imperative that you stop these medications before procedures. Let us know if you if you take any blood thinner.  Infection: Avoid procedures during or within two weeks of an infection (including chest colds or gastrointestinal problems). Symptoms associated with infections include: Localized redness, fever, chills, night sweats or profuse sweating, burning sensation when voiding, cough, congestion, stuffiness, runny nose, sore throat, diarrhea, nausea, vomiting, cold or Flu symptoms, recent or current infections. It is specially important if the infection is over the area that we intend to treat. Heart and lung problems: Symptoms that may suggest an active cardiopulmonary problem include: cough, chest pain, breathing difficulties or shortness of breath, dizziness, ankle swelling, uncontrolled high or unusually low blood pressure, and/or palpitations. If you are experiencing any of these symptoms, cancel your procedure and contact your primary care physician for an evaluation.  Remember:  Regular Business hours are:  Monday  to Thursday 8:00 AM to 4:00 PM  Provider's Schedule: Delano Metz, MD:  Procedure days: Tuesday and Thursday 7:30 AM to 4:00 PM  Edward Jolly, MD:  Procedure days: Monday and Wednesday 7:30 AM to 4:00 PM Last  Updated: 06/29/2023 ______________________________________________________________________      ______________________________________________________________________    General Risks and Possible Complications  Patient Responsibilities: It is important that you read this as it is part of your informed consent. It is our duty to inform you of the risks and possible complications associated with treatments offered to you. It is your responsibility as a patient to read this and to ask questions about anything that is not clear or that you believe was not covered in this document.  Patient's Rights: You have the right to refuse treatment. You also have the right to change your mind, even after initially having agreed to have the treatment done. However, under this last option, if you wait until the last second to change your mind, you may be charged for the materials used up to that point.  Introduction: Medicine is not an Visual merchandiser. Everything in Medicine, including the lack of treatment(s), carries the potential for danger, harm, or loss (which is by definition: Risk). In Medicine, a complication is a secondary problem, condition, or disease that can aggravate an already existing one. All treatments carry the risk of possible  complications. The fact that a side effects or complications occurs, does not imply that the treatment was conducted incorrectly. It must be clearly understood that these can happen even when everything is done following the highest safety standards.  No treatment: You can choose not to proceed with the proposed treatment alternative. The "PRO(s)" would include: avoiding the risk of complications associated with the therapy. The "CON(s)" would include: not  getting any of the treatment benefits. These benefits fall under one of three categories: diagnostic; therapeutic; and/or palliative. Diagnostic benefits include: getting information which can ultimately lead to improvement of the disease or symptom(s). Therapeutic benefits are those associated with the successful treatment of the disease. Finally, palliative benefits are those related to the decrease of the primary symptoms, without necessarily curing the condition (example: decreasing the pain from a flare-up of a chronic condition, such as incurable terminal cancer).  General Risks and Complications: These are associated to most interventional treatments. They can occur alone, or in combination. They fall under one of the following six (6) categories: no benefit or worsening of symptoms; bleeding; infection; nerve damage; allergic reactions; and/or death. No benefits or worsening of symptoms: In Medicine there are no guarantees, only probabilities. No healthcare provider can ever guarantee that a medical treatment will work, they can only state the probability that it may. Furthermore, there is always the possibility that the condition may worsen, either directly, or indirectly, as a consequence of the treatment. Bleeding: This is more common if the patient is taking a blood thinner, either prescription or over the counter (example: Goody Powders, Fish oil, Aspirin, Garlic, etc.), or if suffering a condition associated with impaired coagulation (example: Hemophilia, cirrhosis of the liver, low platelet counts, etc.). However, even if you do not have one on these, it can still happen. If you have any of these conditions, or take one of these drugs, make sure to notify your treating physician. Infection: This is more common in patients with a compromised immune system, either due to disease (example: diabetes, cancer, human immunodeficiency virus [HIV], etc.), or due to medications or treatments (example:  therapies used to treat cancer and rheumatological diseases). However, even if you do not have one on these, it can still happen. If you have any of these conditions, or take one of these drugs, make sure to notify your treating physician. Nerve Damage: This is more common when the treatment is an invasive one, but it can also happen with the use of medications, such as those used in the treatment of cancer. The damage can occur to small secondary nerves, or to large primary ones, such as those in the spinal cord and brain. This damage may be temporary or permanent and it may lead to impairments that can range from temporary numbness to permanent paralysis and/or brain death. Allergic Reactions: Any time a substance or material comes in contact with our body, there is the possibility of an allergic reaction. These can range from a mild skin rash (contact dermatitis) to a severe systemic reaction (anaphylactic reaction), which can result in death. Death: In general, any medical intervention can result in death, most of the time due to an unforeseen complication. ______________________________________________________________________

## 2023-08-31 NOTE — Progress Notes (Signed)
PROVIDER NOTE: Information contained herein reflects review and annotations entered in association with encounter. Interpretation of such information and data should be left to medically-trained personnel. Information provided to patient can be located elsewhere in the medical record under "Patient Instructions". Document created using STT-dictation technology, any transcriptional errors that may result from process are unintentional.    Patient: Kelsey Rose  Service Category: E/M  Provider: Oswaldo Done, MD  DOB: 08-14-1961  DOS: 08/31/2023  Referring Provider: Remo Lipps, PA  MRN: [id number]  Specialty: Interventional Pain Management  PCP: Remo Lipps, PA  Type: Established Patient  Setting: Ambulatory outpatient    Location: Office  Delivery: Face-to-face     HPI  Ms. Kelsey Rose, a 62 y.o. year old female, is here today because of her Chronic bilateral low back pain without sciatica [M54.50, G89.29]. Ms. Kelsey Rose primary complain today is Back Pain (lower)  Pertinent problems: Ms. Kelsey Rose has DDD (degenerative disc disease), lumbosacral; Chronic low back pain (1ry area of Pain) (Bilateral) (L>R) w/o sciatica; Pain in shoulder region after shoulder replacement (Right); 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; Chronic pain syndrome; Abnormal MRI, lumbar spine (06/17/2015); Chronic hip pain (4th area of Pain) (Bilateral) (L>R); Chronic shoulder pain (2ry area of Pain) (Bilateral) (L>R); History of total shoulder replacement (Right); Osteoarthritis involving multiple joints; Chronic neck pain (3ry area of Pain) (Midline); Abnormal x-ray of cervical spine; T12 wedge compression fracture, sequela; DDD (degenerative disc disease), cervical; Osteoarthritis of hips (Bilateral); Cervical foraminal stenosis (Right: CC4-5, C5-6, C6-7, C7-T1); Cervical facet hypertrophy; Cervical facet arthropathy; Cervical facet joint pain; Osteophytosis of shoulder (Left); Osteoarthritis of AC  (acromioclavicular) joint (Left); Lumbar facet arthropathy; Lumbar facet joint pain; Osteopenia determined by x-ray; Osteopenia of hips (Bilateral); Ligamentum flavum hypertrophy (L3-4, L4-5, L5-S1); DDD (degenerative disc disease), thoracic; Lumbar intervertebral disc protrusion (Left: L3-4, L4-5); Lumbar foraminal stenosis (Left: L3-4, L5-S1); Spondylosis without myelopathy or radiculopathy, lumbosacral region; and Lumbar facet joint syndrome on their pertinent problem list. Pain Assessment: Severity of Chronic pain is reported as a 4 /10. Location: Back Lower/denies. Onset: More than a month ago. Quality: Throbbing. Timing: Constant. Modifying factor(s): TENS. Vitals:  height is 5\' 6"  (1.676 m) and weight is 232 lb (105.2 kg). Her temperature is 96.8 F (36 C) (abnormal). Her blood pressure is 131/72 and her pulse is 91. Her oxygen saturation is 97%.  BMI: Estimated body mass index is 37.45 kg/m as calculated from the following:   Height as of this encounter: 5\' 6"  (1.676 m).   Weight as of this encounter: 232 lb (105.2 kg). Last encounter: 06/09/2023. Last procedure: 08/17/2023.  Reason for encounter: post-procedure evaluation and assessment.  Cording to the patient she attained 100% relief of the pain which lasted for approximately 2 days before starting to wear off.  Currently her pain he has back at baseline.  The fact that the patient attained 100% relief of the pain for the duration of the local anesthetic confirms the pain to be coming from those facet joints.  Because the pain relief did not last this simply means that the primary etiology of the pain is likely to be more mechanical than inflammatory.  At this point the plan is to repeat the diagnostic lumbar facet block and if again she gets similar results and no long-term benefit, then we will consider moving onto radiofrequency ablation.  The patient denies having any type of hardware in the lumbar spine.  She is still doing some physical  therapy and she feels  that this stretching exercises do help increase her range of motion.  She was recommended to continue with that.  Post-procedure evaluation   Procedure: Lumbar Facet, Medial Branch Block(s) #1  Laterality: Bilateral  Level: L3, L4, L5, and S1 Medial Branch Level(s). Injecting these levels blocks the L4-5 and L5-S1 lumbar facet joints. Imaging: Fluoroscopic guidance Spinal ([id number]) Anesthesia: Local anesthesia (1-2% Lidocaine) Anxiolysis: IV Versed 2.0 mg Sedation: Moderate Sedation Fentanyl 0.5 mL (25 mcg) DOS: 08/17/2023 Performed by: Oswaldo Done, MD  Primary Purpose: Diagnostic/Therapeutic Indications: Low back pain severe enough to impact quality of life or function. 1. Chronic low back pain (1ry area of Pain) (Bilateral) (L>R) w/o sciatica   2. Lumbar facet arthropathy   3. Lumbar facet joint pain   4. Spondylosis without myelopathy or radiculopathy, lumbosacral region   5. Lumbar facet joint syndrome   6. Degeneration of intervertebral disc of lumbosacral region, unspecified whether pain present   7. Abnormal MRI, lumbar spine (06/17/2015)   8. History of allergy to radiographic contrast media    NAS-11 Pain score:   Pre-procedure: 4 /10   Post-procedure: 0-No pain/10      Effectiveness:  Initial hour after procedure: 100 %. Subsequent 4-6 hours post-procedure: 100 %. Analgesia past initial 6 hours: 100 % for approximately 2 days. Ongoing improvement:  Analgesic: Unfortunately, the patient indicates that her pain has gone back to baseline and currently she has no persistent relief. Function: Back to baseline ROM: Back to baseline  Pharmacotherapy Assessment  Analgesic: None MME/day: 0 mg/day   Monitoring: [place] PMP: PDMP reviewed during this encounter.       Pharmacotherapy: No side-effects or adverse reactions reported. Compliance: No problems identified. Effectiveness: Clinically acceptable.  Florina Ou, RN  08/31/2023  1:56  PM  Sign when Signing Visit Safety precautions to be maintained throughout the outpatient stay will include: orient to surroundings, keep bed in low position, maintain call bell within reach at all times, provide assistance with transfer out of bed and ambulation.     No results found for: "CBDTHCR" No results found for: "D8THCCBX" No results found for: "D9THCCBX"  UDS:  Summary  Date Value Ref Range Status  05/24/2023 Note  Final    Comment:    ==================================================================== Compliance Drug Analysis, Ur ==================================================================== Test                             Result       Flag       Units  Drug Present and Declared for Prescription Verification   Citalopram                     PRESENT      EXPECTED   Desmethylcitalopram            PRESENT      EXPECTED    Desmethylcitalopram is an expected metabolite of citalopram or the    enantiomeric form, escitalopram.  Drug Present not Declared for Prescription Verification   Mirtazapine                    PRESENT      UNEXPECTED  Drug Absent but Declared for Prescription Verification   Bupropion                      Not Detected UNEXPECTED   Lidocaine  Not Detected UNEXPECTED    Lidocaine, as indicated in the declared medication list, is not    always detected even when used as directed.    Dextromethorphan               Not Detected UNEXPECTED ==================================================================== Test                      Result    Flag   Units      Ref Range   Creatinine              103              mg/dL      >=16 ==================================================================== Declared Medications:  The flagging and interpretation on this report are based on the  following declared medications.  Unexpected results may arise from  inaccuracies in the declared medications.   **Note: The testing scope of this  panel includes these medications:   Bupropion (Auvelity)  Citalopram (Celexa)  Dextromethorphan (Auvelity)   **Note: The testing scope of this panel does not include small to  moderate amounts of these reported medications:   Topical Lidocaine (Lidoderm)   **Note: The testing scope of this panel does not include the  following reported medications:   Albuterol (Ventolin HFA)  Albuterol (Airsupra)  Alendronate (Fosamax)  Atorvastatin (Lipitor)  Bismuth subsalicylate (Pepto-bismol)  Budesonide (Airsupra)  Loperamide (Imodium)  Nystatin (Mycostatin)  Olodaterol (Stiolto Respimat)  Ondansetron (Zofran)  Tiotropium (Stiolto Respimat) ==================================================================== For clinical consultation, please call [phone]. ====================================================================       ROS  Constitutional: Denies any fever or chills Gastrointestinal: No reported hemesis, hematochezia, vomiting, or acute GI distress Musculoskeletal: Denies any acute onset joint swelling, redness, loss of ROM, or weakness Neurological: No reported episodes of acute onset apraxia, aphasia, dysarthria, agnosia, amnesia, paralysis, loss of coordination, or loss of consciousness  Medication Review  Tiotropium Bromide-Olodaterol, albuterol, alendronate, atorvastatin, citalopram, lidocaine, naproxen sodium, nystatin, and ondansetron  History Review  Allergy: Ms. Kelsey Rose is allergic to sulfa antibiotics, iodinated contrast media, and penicillins. Drug: Ms. Kelsey Rose  reports that she does not currently use drugs. Alcohol:  reports no history of alcohol use. Tobacco:  reports that she has been smoking cigarettes. She has a 53 pack-year smoking history. She does not have any smokeless tobacco history on file. Social: Ms. Kelsey Rose  reports that she has been smoking cigarettes. She has a 53 pack-year smoking history. She does not have any smokeless tobacco history on  file. She reports that she does not currently use drugs. She reports that she does not drink alcohol. Medical:  has a past medical history of Anxiety, Arthritis, Asthma, COPD (chronic obstructive pulmonary disease) (HCC), Depression, and GERD (gastroesophageal reflux disease). Surgical: Ms. Kelsey Rose  has a past surgical history that includes Tonsillectomy and adenoidectomy (Age 34); Abdominal hysterectomy (62 yrs old); and Joint replacement (Right,  May 2015). Family: family history includes COPD in her sister and sister; Cirrhosis in her father; Dementia in her mother; Depression in her sister and sister; Diabetes in her daughter; Gallstones in her mother; Heart attack in her brother; Osteoarthritis in her mother.  Laboratory Chemistry Profile   Renal Lab Results  Component Value Date   BUN 6 (L) 05/24/2023   CREATININE 0.77 05/24/2023   BCR 8 (L) 05/24/2023    Hepatic Lab Results  Component Value Date   AST 14 05/24/2023   ALT 13 01/19/2023   ALBUMIN 4.0 05/24/2023   ALKPHOS 121 05/24/2023  Electrolytes Lab Results  Component Value Date   NA 144 05/24/2023   K 4.4 05/24/2023   CL 105 05/24/2023   CALCIUM 9.5 05/24/2023   MG 1.9 05/24/2023    Bone Lab Results  Component Value Date   VD25OH 37.7 01/19/2023   25OHVITD1 34 05/24/2023   25OHVITD2 <1.0 05/24/2023   25OHVITD3 34 05/24/2023    Inflammation (CRP: Acute Phase) (ESR: Chronic Phase) Lab Results  Component Value Date   CRP 1 05/24/2023   ESRSEDRATE 24 05/24/2023         Note: Above Lab results reviewed.  Recent Imaging Review  DG PAIN CLINIC C-ARM 1-60 MIN NO REPORT Fluoro was used, but no Radiologist interpretation will be provided.  Please refer to "NOTES" tab for provider progress note. Note: Reviewed        Physical Exam  General appearance: Well nourished, well developed, and well hydrated. In no apparent acute distress Mental status: Alert, oriented x 3 (person, place, & time)       Respiratory: No  evidence of acute respiratory distress Eyes: PERLA Vitals: BP 131/72   Pulse 91   Temp (!) 96.8 F (36 C)   Ht 5\' 6"  (1.676 m)   Wt 232 lb (105.2 kg)   SpO2 97%   BMI 37.45 kg/m  BMI: Estimated body mass index is 37.45 kg/m as calculated from the following:   Height as of this encounter: 5\' 6"  (1.676 m).   Weight as of this encounter: 232 lb (105.2 kg). Ideal: Ideal body weight: 59.3 kg (130 lb 11.7 oz) Adjusted ideal body weight: 77.7 kg (171 lb 3.8 oz)  Assessment   Diagnosis Status  1. Chronic low back pain (1ry area of Pain) (Bilateral) (L>R) w/o sciatica   2. Lumbar facet arthropathy   3. Lumbar facet joint pain   4. Lumbar facet joint syndrome   5. Postop check    Controlled Controlled Controlled   Updated Problems: No problems updated.  Plan of Care  Problem-specific:  No problem-specific Assessment & Plan notes found for this encounter.  Ms. Kelsey Rose has a current medication list which includes the following long-term medication(s): albuterol, albuterol, atorvastatin, and citalopram.  Pharmacotherapy (Medications Ordered): No orders of the defined types were placed in this encounter.  Orders:  Orders Placed This Encounter  Procedures   LUMBAR FACET(MEDIAL BRANCH NERVE BLOCK) MBNB    Diagnosis: Lumbar Facet Syndrome (M47.816); Lumbosacral Facet Syndrome (M47.817); Lumbar Facet Joint Pain (M54.59) Medical Necessity Statement: 1.Severe chronic axial low back pain causing functional impairment documented by ongoing pain scale assessments. 2.Pain present for longer than 3 months (Chronic) documented to have failed noninvasive conservative therapies. 3.Absence of untreated radiculopathy. 4.There is no radiological evidence of untreated fractures, tumor, infection, or deformity.  Physical Examination Findings: Positive Kemp Maneuver: (Y)  Positive Lumbar Hyperextension-Rotation provocative test: (Y)    Standing Status:   Future    Standing Expiration  Date:   12/01/2023    Scheduling Instructions:     Procedure: Lumbar facet Block     Type: Medial Branch Block     Side: Bilateral     Purpose: Diagnostic     Level(s): L4-5 and L5-S1 Facets (L3, L4, L5, and S1 Medial Branch)     Sedation: With Sedation.     Timeframe: ASAA    Order Specific Question:   Where will this procedure be performed?    Answer:   [facility]   Nursing Instructions:    Please  complete this patient's postprocedure evaluation.    Scheduling Instructions:     Please complete this patient's postprocedure evaluation.   Follow-up plan:   Return for (ECT): (B) (L4-5, L5-S1) L-FCT Blk #2.      Interventional Therapies  Risk Factors  Considerations  Medical Comorbidities:  COPD  BA  GERD  chronic smoker  paroxysmal vertigo  anxiety  depression  MO (BMI>35)     Planned  Pending:   Diagnostic bilateral (L3-4, L4-5, L5-S1) lumbar facet MBB #1 (08/17/2023)    Under consideration:   Diagnostic bilateral lumbar facet MBB #2  Possible candidate for lumbar facet RFA    Completed:   Diagnostic bilateral lumbar facet MBB x1 (08/17/2023) (100/100/100/0)    Therapeutic  Palliative (PRN) options:   None established   Completed by other providers:   Diagnostic left L3-4, L4-5 IA facet joint inj. x1 (01/28/2016) by Windell Norfolk, MD (PMR) (6/10 to 0/10)  Diagnostic/therapeutic left glenohumeral (shoulder) joint inj. x1 (05/19/2021) by Dian Situ, MD        Recent Visits Date Type Provider Dept  08/17/23 Procedure visit Delano Metz, MD [facility]  06/29/23 Procedure visit Delano Metz, MD [facility]  06/09/23 Office Visit Delano Metz, MD [facility]  Showing recent visits within past 90 days and meeting all other requirements Today's Visits Date Type Provider Dept  08/31/23 Office Visit Delano Metz, MD [facility]  Showing today's visits and meeting all other  requirements Future Appointments Date Type Provider Dept  09/21/23 Appointment Delano Metz, MD [facility]  Showing future appointments within next 90 days and meeting all other requirements  I discussed the assessment and treatment plan with the patient. The patient was provided an opportunity to ask questions and all were answered. The patient agreed with the plan and demonstrated an understanding of the instructions.  Patient advised to call back or seek an in-person evaluation if the symptoms or condition worsens.  Duration of encounter: 30 minutes.  Total time on encounter, as per AMA guidelines included both the face-to-face and non-face-to-face time personally spent by the physician and/or other qualified health care professional(s) on the day of the encounter (includes time in activities that require the physician or other qualified health care professional and does not include time in activities normally performed by clinical staff). Physician's time may include the following activities when performed: Preparing to see the patient (e.g., pre-charting review of records, searching for previously ordered imaging, lab work, and nerve conduction tests) Review of prior analgesic pharmacotherapies. Reviewing PMP Interpreting ordered tests (e.g., lab work, imaging, nerve conduction tests) Performing post-procedure evaluations, including interpretation of diagnostic procedures Obtaining and/or reviewing separately obtained history Performing a medically appropriate examination and/or evaluation Counseling and educating the patient/family/caregiver Ordering medications, tests, or procedures Referring and communicating with other health care professionals (when not separately reported) Documenting clinical information in the electronic or other health record Independently interpreting results (not separately reported) and communicating results to the patient/  family/caregiver Care coordination (not separately reported)  Note by: Oswaldo Done, MD Date: 08/31/2023; Time: 2:29 PM

## 2023-08-31 NOTE — Progress Notes (Signed)
Safety precautions to be maintained throughout the outpatient stay will include: orient to surroundings, keep bed in low position, maintain call bell within reach at all times, provide assistance with transfer out of bed and ambulation.  

## 2023-09-09 ENCOUNTER — Encounter: Payer: Self-pay | Admitting: Gastroenterology

## 2023-09-16 ENCOUNTER — Ambulatory Visit: Payer: 59 | Admitting: Certified Registered"

## 2023-09-16 ENCOUNTER — Encounter: Payer: Self-pay | Admitting: Gastroenterology

## 2023-09-16 ENCOUNTER — Other Ambulatory Visit: Payer: Self-pay

## 2023-09-16 ENCOUNTER — Encounter
Admission: RE | Disposition: A | Discharge status: Home / Self Care | Payer: Self-pay | Source: Home / Self Care | Attending: Gastroenterology

## 2023-09-16 ENCOUNTER — Ambulatory Visit
Admission: RE | Admit: 2023-09-16 | Discharge: 2023-09-16 | Disposition: A | Discharge status: Home / Self Care | Payer: 59 | Attending: Gastroenterology | Admitting: Gastroenterology

## 2023-09-16 DIAGNOSIS — J4489 Other specified chronic obstructive pulmonary disease: Secondary | ICD-10-CM | POA: Insufficient documentation

## 2023-09-16 DIAGNOSIS — F32A Depression, unspecified: Secondary | ICD-10-CM | POA: Diagnosis not present

## 2023-09-16 DIAGNOSIS — F419 Anxiety disorder, unspecified: Secondary | ICD-10-CM | POA: Insufficient documentation

## 2023-09-16 DIAGNOSIS — F17210 Nicotine dependence, cigarettes, uncomplicated: Secondary | ICD-10-CM | POA: Insufficient documentation

## 2023-09-16 DIAGNOSIS — K529 Noninfective gastroenteritis and colitis, unspecified: Secondary | ICD-10-CM | POA: Diagnosis present

## 2023-09-16 DIAGNOSIS — R197 Diarrhea, unspecified: Secondary | ICD-10-CM

## 2023-09-16 DIAGNOSIS — Z5986 Financial insecurity: Secondary | ICD-10-CM | POA: Diagnosis not present

## 2023-09-16 DIAGNOSIS — M1990 Unspecified osteoarthritis, unspecified site: Secondary | ICD-10-CM | POA: Diagnosis not present

## 2023-09-16 HISTORY — PX: BIOPSY: SHX5522

## 2023-09-16 HISTORY — PX: COLONOSCOPY WITH PROPOFOL: SHX5780

## 2023-09-16 SURGERY — COLONOSCOPY WITH PROPOFOL
Anesthesia: General

## 2023-09-16 MED ORDER — SODIUM CHLORIDE 0.9 % IV SOLN: INTRAVENOUS | Status: DC

## 2023-09-16 MED ORDER — LIDOCAINE HCL (CARDIAC) PF 100 MG/5ML IV SOSY
PREFILLED_SYRINGE | INTRAVENOUS | Status: DC | PRN
  Administered 2023-09-16: 100 mg via INTRAVENOUS

## 2023-09-16 MED ORDER — PROPOFOL 500 MG/50ML IV EMUL
INTRAVENOUS | Status: DC | PRN
  Administered 2023-09-16: 50 mg via INTRAVENOUS
  Administered 2023-09-16: 150 ug/kg/min via INTRAVENOUS

## 2023-09-16 NOTE — Op Note (Signed)
[facility] Patient Name: Kelsey Rose Procedure Date: 09/16/2023 8:26 AM MRN: [id number] Account #: [id number] Date of Birth: [birth year] Admit Type: Ambulatory Age: 62 Room: [facility] ENDO ROOM 4 Gender: Female Note Status: Finalized Instrument Name: Prentice Docker [id number] Procedure:             Colonoscopy Indications:           Chronic diarrhea Providers:             Midge Minium MD, MD Referring MD:          Melton Alar. Waddell (Referring MD) Medicines:             Propofol per Anesthesia Complications:         No immediate complications. Procedure:             Pre-Anesthesia Assessment:                        - Prior to the procedure, a History and Physical was                         performed, and patient medications and allergies were                         reviewed. The patient's tolerance of previous                         anesthesia was also reviewed. The risks and benefits                         of the procedure and the sedation options and risks                         were discussed with the patient. All questions were                         answered, and informed consent was obtained. Prior                         Anticoagulants: The patient has taken no anticoagulant                         or antiplatelet agents. ASA Grade Assessment: II - A                         patient with mild systemic disease. After reviewing                         the risks and benefits, the patient was deemed in                         satisfactory condition to undergo the procedure.                        After obtaining informed consent, the colonoscope was                         passed under direct vision. Throughout the procedure,  the patient's blood pressure, pulse, and oxygen                         saturations were monitored continuously. The                         Colonoscope was introduced through the anus and                          advanced to the the cecum, identified by appendiceal                         orifice and ileocecal valve. The colonoscopy was                         performed without difficulty. The patient tolerated                         the procedure well. The quality of the bowel                         preparation was poor. Findings:      The perianal and digital rectal examinations were normal.      A moderate amount of stool was found in the entire colon, precluding       visualization. Biopsies for histology were taken with a cold forceps       from the entire colon for evaluation of microscopic colitis. Impression:            - Preparation of the colon was poor.                        - Stool in the entire examined colon. Biopsied. Recommendation:        - Discharge patient to home.                        - Resume previous diet.                        - Continue present medications.                        - Await pathology results.                        - Repeat colonoscopy in 1 year because the bowel                         preparation was poor. Procedure Code(s):     --- Professional ---                        [phone], Colonoscopy, flexible; with biopsy, single or                         multiple Diagnosis Code(s):     --- Professional ---                        K52.9, Noninfective gastroenteritis and colitis,  unspecified CPT copyright 2022 [employer]. All rights reserved. The codes documented in this report are preliminary and upon coder review may  be revised to meet current compliance requirements. Midge Minium MD, MD 09/16/2023 8:49:47 AM This report has been signed electronically. Number of Addenda: 0 Note Initiated On: 09/16/2023 8:26 AM Scope Withdrawal Time: 0 hours 7 minutes 32 seconds  Total Procedure Duration: 0 hours 10 minutes 58 seconds  Estimated Blood Loss:  Estimated blood loss: none.      [facility]

## 2023-09-16 NOTE — H&P (Signed)
Midge Minium, MD [facility] [address] [place], [place] [address] Phone:[phone] Fax : [phone]  Primary Care Physician:  Remo Lipps, PA Primary Gastroenterologist:  Dr. Servando Snare  Pre-Procedure History & Physical: HPI:  Kelsey Rose is a 62 y.o. female is here for an colonoscopy.   Past Medical History:  Diagnosis Date   Anxiety    Arthritis    Asthma    COPD (chronic obstructive pulmonary disease) (HCC)    Depression    GERD (gastroesophageal reflux disease)     Past Surgical History:  Procedure Laterality Date   ABDOMINAL HYSTERECTOMY  62 yrs old   JOINT REPLACEMENT Right  May 2015   Shoulder replacement   TONSILLECTOMY AND ADENOIDECTOMY  Age 76    Prior to Admission medications   Medication Sig Start Date End Date Taking? Authorizing Provider  alendronate (FOSAMAX) 70 MG tablet Take 1 tablet (70 mg total) by mouth every 7 (seven) days. Take with a full glass of water on an empty stomach. 04/14/23  Yes Remo Lipps, PA  atorvastatin (LIPITOR) 20 MG tablet Take 1 tablet (20 mg total) by mouth daily. 07/23/23 01/19/24 Yes Waddell, Melton Alar, PA  citalopram (CELEXA) 40 MG tablet Take 1 tablet (40 mg total) by mouth daily. 08/16/23  Yes Remo Lipps, PA  naproxen sodium (ALEVE) 220 MG tablet Take 220 mg by mouth as needed.   Yes [provider]  ondansetron (ZOFRAN) 4 MG tablet Take 4 mg by mouth every 8 (eight) hours as needed for nausea or vomiting.   Yes [provider]  Tiotropium Bromide-Olodaterol (STIOLTO RESPIMAT) 2.5-2.5 MCG/ACT AERS Inhale 2 each into the lungs daily.   Yes [provider]  albuterol (ACCUNEB) 1.25 MG/3ML nebulizer solution Take 1 ampule by nebulization every 6 (six) hours as needed for wheezing.    [provider]  albuterol (PROVENTIL HFA;VENTOLIN HFA) 108 (90 Base) MCG/ACT inhaler Inhale 2 puffs into the lungs every 6 (six) hours as needed for wheezing or shortness of breath. 01/09/17    Orvil Feil, PA-C  lidocaine (LIDODERM) 5 % Place 1 patch onto the skin daily. Remove & Discard patch within 12 hours or as directed by MD    [provider]  nystatin powder Apply 1 Application topically 3 (three) times daily. PRN    [provider]    Allergies as of 08/10/2023 - Review Complete 08/10/2023  Allergen Reaction Noted   Sulfa antibiotics Swelling 03/03/2016   Iodinated contrast media Nausea Only 02/24/2016   Penicillins Nausea And Vomiting 03/03/2016    Family History  Problem Relation Age of Onset   Cirrhosis Father    Depression Sister    COPD Sister    Diabetes Daughter    Depression Sister    COPD Sister    Gallstones Mother    Dementia Mother    Osteoarthritis Mother    Heart attack Brother     Social History   Socioeconomic History   Marital status: Single    Spouse name: Not on file   Number of children: 1   Years of education: Not on file   Highest education level: GED or equivalent  Occupational History   Not on file  Tobacco Use   Smoking status: Every Day    Current packs/day: 1.00    Average packs/day: 1 pack/day for 53.0 years (53.0 ttl pk-yrs)    Types: Cigarettes   Smokeless tobacco: Not on file   Tobacco comments:  Smoking 0.25 ppd as of 04/14/23  Vaping Use   Vaping status: Never Used  Substance and Sexual Activity   Alcohol use: No   Drug use: Not Currently   Sexual activity: Not Currently    Birth control/protection: None  Other Topics Concern   Not on file  Social History Narrative   Not on file   Social Determinants of Health   Financial Resource Strain: Low Risk  (03/03/2023)   Overall Financial Resource Strain (CARDIA)    Difficulty of Paying Living Expenses: Not hard at all  Recent Concern: Financial Resource Strain - Medium Risk (02/16/2023)   Overall Financial Resource Strain (CARDIA)    Difficulty of Paying Living Expenses: Somewhat hard  Food Insecurity: No Food Insecurity (03/03/2023)    Hunger Vital Sign    Worried About Running Out of Food in the Last Year: Never true    Ran Out of Food in the Last Year: Never true  Recent Concern: Food Insecurity - Food Insecurity Present (02/16/2023)   Hunger Vital Sign    Worried About Running Out of Food in the Last Year: Sometimes true    Ran Out of Food in the Last Year: Sometimes true  Transportation Needs: No Transportation Needs (03/03/2023)   PRAPARE - Administrator, Civil Service (Medical): No    Lack of Transportation (Non-Medical): No  Physical Activity: Insufficiently Active (03/03/2023)   Exercise Vital Sign    Days of Exercise per Week: 3 days    Minutes of Exercise per Session: 30 min  Stress: No Stress Concern Present (03/03/2023)   [employer] of Occupational Health - Occupational Stress Questionnaire    Feeling of Stress : Only a little  Recent Concern: Stress - Stress Concern Present (02/16/2023)   [employer] of Occupational Health - Occupational Stress Questionnaire    Feeling of Stress : To some extent  Social Connections: Socially Isolated (03/03/2023)   Social Connection and Isolation Panel [NHANES]    Frequency of Communication with Friends and Family: More than three times a week    Frequency of Social Gatherings with Friends and Family: More than three times a week    Attends Religious Services: Never    Database administrator or Organizations: No    Attends Banker Meetings: Never    Marital Status: Never married  Intimate Partner Violence: Not At Risk (03/03/2023)   Humiliation, Afraid, Rape, and Kick questionnaire    Fear of Current or Ex-Partner: No    Emotionally Abused: No    Physically Abused: No    Sexually Abused: No    Review of Systems: See HPI, otherwise negative ROS  Physical Exam: BP (!) 144/85   Pulse 80   Temp (!) 97.3 F (36.3 C) (Temporal)   Wt 106.6 kg   SpO2 100%   BMI 37.93 kg/m  General:   Alert,  pleasant and cooperative in NAD Head:   Normocephalic and atraumatic. Neck:  Supple; no masses or thyromegaly. Lungs:  Clear throughout to auscultation.    Heart:  Regular rate and rhythm. Abdomen:  Soft, nontender and nondistended. Normal bowel sounds, without guarding, and without rebound.   Neurologic:  Alert and  oriented x4;  grossly normal neurologically.  Impression/Plan: Kelsey Rose is here for an colonoscopy to be performed for diarrhea  Risks, benefits, limitations, and alternatives regarding  colonoscopy have been reviewed with the patient.  Questions have been answered.  All parties agreeable.   Midge Minium, MD  09/16/2023, 8:22 AM

## 2023-09-16 NOTE — Anesthesia Preprocedure Evaluation (Signed)
Anesthesia Evaluation  Patient identified by MRN, date of birth, ID band Patient awake    Reviewed: Allergy & Precautions, NPO status , Patient's Chart, lab work & pertinent test results  History of Anesthesia Complications Negative for: history of anesthetic complications  Airway Mallampati: IV   Neck ROM: Full    Dental  (+) Upper Dentures, Lower Dentures   Pulmonary asthma , COPD, Current Smoker (1/2 ppd)Patient did not abstain from smoking.   Pulmonary exam normal breath sounds clear to auscultation       Cardiovascular negative cardio ROS Normal cardiovascular exam Rhythm:Regular Rate:Normal     Neuro/Psych  PSYCHIATRIC DISORDERS Anxiety Depression    negative neurological ROS     GI/Hepatic ,GERD  ,,  Endo/Other  Obesity   Renal/GU negative Renal ROS     Musculoskeletal  (+) Arthritis ,    Abdominal   Peds  Hematology  (+) REFUSES BLOOD PRODUCTS, JEHOVAH'S WITNESS  Anesthesia Other Findings   Reproductive/Obstetrics                             Anesthesia Physical Anesthesia Plan  ASA: 2  Anesthesia Plan: General   Post-op Pain Management:    Induction: Intravenous  PONV Risk Score and Plan: 2 and Propofol infusion, TIVA and Treatment may vary due to age or medical condition  Airway Management Planned: Natural Airway  Additional Equipment:   Intra-op Plan:   Post-operative Plan:   Informed Consent: I have reviewed the patients History and Physical, chart, labs and discussed the procedure including the risks, benefits and alternatives for the proposed anesthesia with the patient or authorized representative who has indicated his/her understanding and acceptance.       Plan Discussed with: CRNA  Anesthesia Plan Comments: (LMA/GETA backup discussed.  Patient consented for risks of anesthesia including but not limited to:  - adverse reactions to medications -  damage to eyes, teeth, lips or other oral mucosa - nerve damage due to positioning  - sore throat or hoarseness - damage to heart, brain, nerves, lungs, other parts of body or loss of life  Informed patient about role of CRNA in peri- and intra-operative care.  Patient voiced understanding.)       Anesthesia Quick Evaluation

## 2023-09-16 NOTE — Anesthesia Postprocedure Evaluation (Signed)
Anesthesia Post Note  Patient: Kelsey Rose  Procedure(s) Performed: COLONOSCOPY WITH PROPOFOL BIOPSY  Patient location during evaluation: Endoscopy Anesthesia Type: General Level of consciousness: awake and alert Pain management: pain level controlled Vital Signs Assessment: post-procedure vital signs reviewed and stable Respiratory status: spontaneous breathing, nonlabored ventilation, respiratory function stable and patient connected to nasal cannula oxygen Cardiovascular status: blood pressure returned to baseline and stable Postop Assessment: no apparent nausea or vomiting Anesthetic complications: no   There were no known notable events for this encounter.   Last Vitals:  Vitals:   09/16/23 0905 09/16/23 0912  BP:  (!) 110/57  Pulse: 81 86  Resp:    Temp:    SpO2:  100%    Last Pain:  Vitals:   09/16/23 0912  TempSrc:   PainSc: 0-No pain                 Corinda Gubler

## 2023-09-16 NOTE — Transfer of Care (Signed)
Immediate Anesthesia Transfer of Care Note  Patient: Kelsey Rose  Procedure(s) Performed: COLONOSCOPY WITH PROPOFOL BIOPSY  Patient Location: PACU  Anesthesia Type:General  Level of Consciousness: drowsy and patient cooperative  Airway & Oxygen Therapy: Patient Spontanous Breathing  Post-op Assessment: Report given to RN and Post -op Vital signs reviewed and stable  Post vital signs: stable  Last Vitals:  Vitals Value Taken Time  BP 98/62 09/16/23 0852  Temp 36.1 C 09/16/23 0852  Pulse 78 09/16/23 0852  Resp 19 09/16/23 0852  SpO2 100 % 09/16/23 0852  Vitals shown include unfiled device data.  Last Pain:  Vitals:   09/16/23 0852  TempSrc: Temporal  PainSc: Asleep         Complications: No notable events documented.

## 2023-09-17 ENCOUNTER — Encounter: Payer: Self-pay | Admitting: Gastroenterology

## 2023-09-17 LAB — SURGICAL PATHOLOGY

## 2023-09-21 ENCOUNTER — Ambulatory Visit: Payer: 59 | Admitting: Pain Medicine

## 2023-09-22 ENCOUNTER — Ambulatory Visit: Payer: 59 | Admitting: Physician Assistant

## 2023-09-27 ENCOUNTER — Ambulatory Visit: Payer: 59 | Admitting: Physician Assistant

## 2023-09-28 ENCOUNTER — Ambulatory Visit
Admission: RE | Admit: 2023-09-28 | Discharge: 2023-09-28 | Disposition: A | Discharge status: Home / Self Care | Payer: 59 | Source: Ambulatory Visit | Attending: Pain Medicine | Admitting: Pain Medicine

## 2023-09-28 ENCOUNTER — Encounter: Payer: Self-pay | Admitting: Pain Medicine

## 2023-09-28 ENCOUNTER — Ambulatory Visit: Payer: 59 | Attending: Pain Medicine | Admitting: Pain Medicine

## 2023-09-28 VITALS — BP 124/75 | HR 88 | Temp 97.2°F | Resp 16 | Ht 66.0 in | Wt 232.0 lb

## 2023-09-28 DIAGNOSIS — M47817 Spondylosis without myelopathy or radiculopathy, lumbosacral region: Secondary | ICD-10-CM | POA: Insufficient documentation

## 2023-09-28 DIAGNOSIS — M545 Low back pain, unspecified: Secondary | ICD-10-CM | POA: Diagnosis not present

## 2023-09-28 DIAGNOSIS — M47816 Spondylosis without myelopathy or radiculopathy, lumbar region: Secondary | ICD-10-CM | POA: Insufficient documentation

## 2023-09-28 DIAGNOSIS — Z91041 Radiographic dye allergy status: Secondary | ICD-10-CM | POA: Diagnosis present

## 2023-09-28 DIAGNOSIS — R937 Abnormal findings on diagnostic imaging of other parts of musculoskeletal system: Secondary | ICD-10-CM | POA: Insufficient documentation

## 2023-09-28 DIAGNOSIS — G8929 Other chronic pain: Secondary | ICD-10-CM | POA: Diagnosis not present

## 2023-09-28 DIAGNOSIS — M5137 Other intervertebral disc degeneration, lumbosacral region with discogenic back pain only: Secondary | ICD-10-CM | POA: Diagnosis present

## 2023-09-28 DIAGNOSIS — M5459 Other low back pain: Secondary | ICD-10-CM | POA: Insufficient documentation

## 2023-09-28 MED ORDER — FENTANYL CITRATE (PF) 100 MCG/2ML IJ SOLN
INTRAMUSCULAR | Status: AC
  Filled 2023-09-28: qty 2

## 2023-09-28 MED ORDER — PENTAFLUOROPROP-TETRAFLUOROETH EX AERO
INHALATION_SPRAY | Freq: Once | CUTANEOUS | Status: AC
  Administered 2023-09-28: 30 via TOPICAL

## 2023-09-28 MED ORDER — LIDOCAINE HCL 2 % IJ SOLN
20.0000 mL | Freq: Once | INTRAMUSCULAR | Status: AC
  Administered 2023-09-28: 400 mg

## 2023-09-28 MED ORDER — MIDAZOLAM HCL 5 MG/5ML IJ SOLN
INTRAMUSCULAR | Status: AC
  Filled 2023-09-28: qty 5

## 2023-09-28 MED ORDER — LACTATED RINGERS IV SOLN: Freq: Once | INTRAVENOUS | Status: AC

## 2023-09-28 MED ORDER — MIDAZOLAM HCL 5 MG/5ML IJ SOLN
0.5000 mg | Freq: Once | INTRAMUSCULAR | Status: AC
  Administered 2023-09-28: 3 mg via INTRAVENOUS

## 2023-09-28 MED ORDER — LIDOCAINE HCL 2 % IJ SOLN
INTRAMUSCULAR | Status: AC
Start: 2023-09-28 — End: ?
  Filled 2023-09-28: qty 20

## 2023-09-28 MED ORDER — ROPIVACAINE HCL 2 MG/ML IJ SOLN
18.0000 mL | Freq: Once | INTRAMUSCULAR | Status: AC
Start: 2023-09-28 — End: 2023-09-28
  Administered 2023-09-28: 18 mL via PERINEURAL

## 2023-09-28 MED ORDER — FENTANYL CITRATE (PF) 100 MCG/2ML IJ SOLN
25.0000 ug | INTRAMUSCULAR | Status: DC | PRN
  Administered 2023-09-28: 50 ug via INTRAVENOUS

## 2023-09-28 MED ORDER — TRIAMCINOLONE ACETONIDE 40 MG/ML IJ SUSP
INTRAMUSCULAR | Status: AC
Start: 2023-09-28 — End: ?
  Filled 2023-09-28: qty 2

## 2023-09-28 MED ORDER — ROPIVACAINE HCL 2 MG/ML IJ SOLN
INTRAMUSCULAR | Status: AC
  Filled 2023-09-28: qty 20

## 2023-09-28 MED ORDER — TRIAMCINOLONE ACETONIDE 40 MG/ML IJ SUSP
80.0000 mg | Freq: Once | INTRAMUSCULAR | Status: AC
  Administered 2023-09-28: 80 mg

## 2023-09-28 MED ORDER — PENTAFLUOROPROP-TETRAFLUOROETH EX AERO
INHALATION_SPRAY | CUTANEOUS | Status: AC
  Filled 2023-09-28: qty 30

## 2023-09-28 NOTE — Progress Notes (Signed)
Safety precautions to be maintained throughout the outpatient stay will include: orient to surroundings, keep bed in low position, maintain call bell within reach at all times, provide assistance with transfer out of bed and ambulation.  

## 2023-09-28 NOTE — Patient Instructions (Signed)

## 2023-09-28 NOTE — Progress Notes (Signed)
PROVIDER NOTE: Interpretation of information contained herein should be left to medically-trained personnel. Specific patient instructions are provided elsewhere under "Patient Instructions" section of medical record. This document was created in part using STT-dictation technology, any transcriptional errors that may result from this process are unintentional.  Patient: Kelsey Rose Type: Established DOB: [birth year] MRN: [id number] PCP: Remo Lipps, PA  Service: Procedure DOS: 09/28/2023 Setting: Ambulatory Location: Ambulatory outpatient facility Delivery: Face-to-face Provider: Oswaldo Done, MD Specialty: Interventional Pain Management Specialty designation: 09 Location: Outpatient facility Ref. Prov.: Remo Lipps, PA       Interventional Therapy   Type: Lumbar Facet, Medial Branch Block(s) (w/ fluoroscopic mapping) #2  Laterality: Bilateral  Level: L3, L4, L5, and S1 Medial Branch Level(s). Injecting these levels blocks the L4-5 and L5-S1 lumbar facet joints. Imaging: Fluoroscopic guidance Spinal ([id number]) Anesthesia: Local anesthesia (1-2% Lidocaine) Anxiolysis: IV Versed         Sedation: Moderate Sedation                       DOS: 09/28/2023 Performed by: Oswaldo Done, MD  Primary Purpose: Diagnostic/Therapeutic Indications: Low back pain severe enough to impact quality of life or function. 1. Chronic low back pain (1ry area of Pain) (Bilateral) (L>R) w/o sciatica   2. Lumbar facet arthropathy   3. Lumbar facet joint pain   4. Lumbar facet joint syndrome   5. Spondylosis without myelopathy or radiculopathy, lumbosacral region   6. Degeneration of intervertebral disc of lumbosacral region with discogenic back pain   7. Abnormal MRI, lumbar spine (06/17/2015)   8. History of allergy to radiographic contrast media    NAS-11 Pain score:   Pre-procedure: 7 /10   Post-procedure: 0-No pain/10     Position / Prep / Materials:  Position: Prone   Prep solution: ChloraPrep (2% chlorhexidine gluconate and 70% isopropyl alcohol) Area Prepped: Posterolateral Lumbosacral Spine (Wide prep: From the lower border of the scapula down to the end of the tailbone and from flank to flank.)  Materials:  Tray: Block Needle(s):  Type: Spinal  Gauge (G): 22  Length: 5-in Qty: 4      H&P (Pre-op Assessment):  [name] is a 62 y.o. (year old), female patient, seen today for interventional treatment. She  has a past surgical history that includes Tonsillectomy and adenoidectomy (Age 27); Abdominal hysterectomy (62 yrs old); Joint replacement (Right,  May 2015); Colonoscopy with propofol (N/A, 09/16/2023); and biopsy (09/16/2023). [name] has a current medication list which includes the following prescription(s): albuterol, albuterol, alendronate, atorvastatin, citalopram, lidocaine, naproxen sodium, nystatin, ondansetron, and stiolto respimat, and the following Facility-Administered Medications: fentanyl. Her primarily concern today is the Back Pain  Initial Vital Signs:  Pulse/HCG Rate: 88ECG Heart Rate: 88 (NSR) Temp: (!) 97.2 F (36.2 C) Resp: 18 BP: 126/82 SpO2: 96 %  BMI: Estimated body mass index is 37.45 kg/m as calculated from the following:   Height as of this encounter: 5\' 6"  (1.676 m).   Weight as of this encounter: 232 lb (105.2 kg).  Risk Assessment: Allergies: Reviewed. She is allergic to sulfa antibiotics, iodinated contrast media, and penicillins.  Allergy Precautions: None required Coagulopathies: Reviewed. None identified.  Blood-thinner therapy: None at this time Active Infection(s): Reviewed. None identified. [name] is afebrile  Site Confirmation: [name] was asked to confirm the procedure and laterality before marking the site Procedure checklist: Completed Consent: Before the procedure and under the influence of no sedative(s), amnesic(s),  or anxiolytics, the patient was informed of the treatment options, risks  and possible complications. To fulfill our ethical and legal obligations, as recommended by the American Medical Association's Code of Ethics, I have informed the patient of my clinical impression; the nature and purpose of the treatment or procedure; the risks, benefits, and possible complications of the intervention; the alternatives, including doing nothing; the risk(s) and benefit(s) of the alternative treatment(s) or procedure(s); and the risk(s) and benefit(s) of doing nothing. The patient was provided information about the general risks and possible complications associated with the procedure. These may include, but are not limited to: failure to achieve desired goals, infection, bleeding, organ or nerve damage, allergic reactions, paralysis, and death. In addition, the patient was informed of those risks and complications associated to Spine-related procedures, such as failure to decrease pain; infection (i.e.: Meningitis, epidural or intraspinal abscess); bleeding (i.e.: epidural hematoma, subarachnoid hemorrhage, or any other type of intraspinal or peri-dural bleeding); organ or nerve damage (i.e.: Any type of peripheral nerve, nerve root, or spinal cord injury) with subsequent damage to sensory, motor, and/or autonomic systems, resulting in permanent pain, numbness, and/or weakness of one or several areas of the body; allergic reactions; (i.e.: anaphylactic reaction); and/or death. Furthermore, the patient was informed of those risks and complications associated with the medications. These include, but are not limited to: allergic reactions (i.e.: anaphylactic or anaphylactoid reaction(s)); adrenal axis suppression; blood sugar elevation that in diabetics may result in ketoacidosis or comma; water retention that in patients with history of congestive heart failure may result in shortness of breath, pulmonary edema, and decompensation with resultant heart failure; weight gain; swelling or edema;  medication-induced neural toxicity; particulate matter embolism and blood vessel occlusion with resultant organ, and/or nervous system infarction; and/or aseptic necrosis of one or more joints. Finally, the patient was informed that Medicine is not an exact science; therefore, there is also the possibility of unforeseen or unpredictable risks and/or possible complications that may result in a catastrophic outcome. The patient indicated having understood very clearly. We have given the patient no guarantees and we have made no promises. Enough time was given to the patient to ask questions, all of which were answered to the patient's satisfaction. Ms. Bowerman has indicated that she wanted to continue with the procedure. Attestation: I, the ordering provider, attest that I have discussed with the patient the benefits, risks, side-effects, alternatives, likelihood of achieving goals, and potential problems during recovery for the procedure that I have provided informed consent. Date  Time: 09/28/2023  8:24 AM   Pre-Procedure Preparation:  Monitoring: As per clinic protocol. Respiration, ETCO2, SpO2, BP, heart rate and rhythm monitor placed and checked for adequate function Safety Precautions: Patient was assessed for positional comfort and pressure points before starting the procedure. Time-out: I initiated and conducted the "Time-out" before starting the procedure, as per protocol. The patient was asked to participate by confirming the accuracy of the "Time Out" information. Verification of the correct person, site, and procedure were performed and confirmed by me, the nursing staff, and the patient. "Time-out" conducted as per Joint Commission's Universal Protocol (UP.01.01.01). Time: 0858 Start Time: 0858 hrs.  Description of Procedure:          Laterality: (see above) Targeted Levels: (see above)  Safety Precautions: Aspiration looking for blood return was conducted prior to all injections. At no  point did we inject any substances, as a needle was being advanced. Before injecting, the patient was told to immediately notify  me if she was experiencing any new onset of "ringing in the ears, or metallic taste in the mouth". No attempts were made at seeking any paresthesias. Safe injection practices and needle disposal techniques used. Medications properly checked for expiration dates. SDV (single dose vial) medications used. After the completion of the procedure, all disposable equipment used was discarded in the proper designated medical waste containers. Local Anesthesia: Protocol guidelines were followed. The patient was positioned over the fluoroscopy table. The area was prepped in the usual manner. The time-out was completed. The target area was identified using fluoroscopy. A 12-in long, straight, sterile hemostat was used with fluoroscopic guidance to locate the targets for each level blocked. Once located, the skin was marked with an approved surgical skin marker. Once all sites were marked, the skin (epidermis, dermis, and hypodermis), as well as deeper tissues (fat, connective tissue and muscle) were infiltrated with a small amount of a short-acting local anesthetic, loaded on a 10cc syringe with a 25G, 1.5-in  Needle. An appropriate amount of time was allowed for local anesthetics to take effect before proceeding to the next step. Local Anesthetic: Lidocaine 2.0% The unused portion of the local anesthetic was discarded in the proper designated containers. Technical description of process:   L3 Medial Branch Nerve Block (MBB): The target area for the L3 medial branch is at the junction of the postero-lateral aspect of the superior articular process and the superior, posterior, and medial edge of the transverse process of L4. Under fluoroscopic guidance, a Quincke needle was inserted until contact was made with os over the superior postero-lateral aspect of the pedicular shadow (target area). After  negative aspiration for blood, 0.5 mL of the nerve block solution was injected without difficulty or complication. The needle was removed intact. L4 Medial Branch Nerve Block (MBB): The target area for the L4 medial branch is at the junction of the postero-lateral aspect of the superior articular process and the superior, posterior, and medial edge of the transverse process of L5. Under fluoroscopic guidance, a Quincke needle was inserted until contact was made with os over the superior postero-lateral aspect of the pedicular shadow (target area). After negative aspiration for blood, 0.5 mL of the nerve block solution was injected without difficulty or complication. The needle was removed intact. L5 Medial Branch Nerve Block (MBB): The target area for the L5 medial branch is at the junction of the postero-lateral aspect of the superior articular process and the superior, posterior, and medial edge of the sacral ala. Under fluoroscopic guidance, a Quincke needle was inserted until contact was made with os over the superior postero-lateral aspect of the pedicular shadow (target area). After negative aspiration for blood, 0.5 mL of the nerve block solution was injected without difficulty or complication. The needle was removed intact. S1 Medial Branch Nerve Block (MBB): The target area for the S1 medial branch is at the posterior and inferior 6 o'clock position of the L5-S1 facet joint. Under fluoroscopic guidance, the Quincke needle inserted for the L5 MBB was redirected until contact was made with os over the inferior and postero aspect of the sacrum, at the 6 o' clock position under the L5-S1 facet joint (Target area). After negative aspiration for blood, 0.5 mL of the nerve block solution was injected without difficulty or complication. The needle was removed intact.  Once the entire procedure was completed, the treated area was cleaned, making sure to leave some of the prepping solution back to take advantage  of its long  term bactericidal properties.         Illustration of the posterior view of the lumbar spine and the posterior neural structures. Laminae of L2 through S1 are labeled. DPRL5, dorsal primary ramus of L5; DPRS1, dorsal primary ramus of S1; DPR3, dorsal primary ramus of L3; FJ, facet (zygapophyseal) joint L3-L4; I, inferior articular process of L4; LB1, lateral branch of dorsal primary ramus of L1; IAB, inferior articular branches from L3 medial branch (supplies L4-L5 facet joint); IBP, intermediate branch plexus; MB3, medial branch of dorsal primary ramus of L3; NR3, third lumbar nerve root; S, superior articular process of L5; SAB, superior articular branches from L4 (supplies L4-5 facet joint also); TP3, transverse process of L3.   Facet Joint Innervation (* possible contribution)  L1-2 T12, L1 (L2*)  Medial Branch  L2-3 L1, L2 (L3*)         "          "  L3-4 L2, L3 (L4*)         "          "  L4-5 L3, L4 (L5*)         "          "  L5-S1 L4, L5, S1          "          "    Vitals:   09/28/23 0900 09/28/23 0906 09/28/23 0916 09/28/23 0926  BP: 132/75 113/68 108/76 124/75  Pulse:      Resp: 15 17 16 16   Temp:      SpO2: 100% 100% 96% 95%  Weight:      Height:         End Time: 0906 hrs.  Imaging Guidance (Spinal):          Type of Imaging Technique: Fluoroscopy Guidance (Spinal) Indication(s): Fluoroscopy guidance for needle placement to enhance accuracy in procedures requiring precise needle localization for targeted delivery of medication in or near specific anatomical locations not easily accessible without such real-time imaging assistance. Exposure Time: Please see nurses notes. Contrast: None used. Fluoroscopic Guidance: I was personally present during the use of fluoroscopy. "Tunnel Vision Technique" used to obtain the best possible view of the target area. Parallax error corrected before commencing the procedure. "Direction-depth-direction" technique used to  introduce the needle under continuous pulsed fluoroscopy. Once target was reached, antero-posterior, oblique, and lateral fluoroscopic projection used confirm needle placement in all planes. Images permanently stored in EMR. Interpretation: No contrast injected. I personally interpreted the imaging intraoperatively. Adequate needle placement confirmed in multiple planes. Permanent images saved into the patient's record.  Post-operative Assessment:  Post-procedure Vital Signs:  Pulse/HCG Rate: 8880 Temp: (!) 97.2 F (36.2 C) Resp: 16 BP: 124/75 SpO2: 95 %  EBL: None  Complications: No immediate post-treatment complications observed by team, or reported by patient.  Note: The patient tolerated the entire procedure well. A repeat set of vitals were taken after the procedure and the patient was kept under observation following institutional policy, for this type of procedure. Post-procedural neurological assessment was performed, showing return to baseline, prior to discharge. The patient was provided with post-procedure discharge instructions, including a section on how to identify potential problems. Should any problems arise concerning this procedure, the patient was given instructions to immediately contact us, at any time, without hesitation. In any case, we plan to contact the patient by telephone for a follow-up status report regarding this interventional procedure.  Comments:  No  additional relevant information.  Plan of Care (POC)  Orders:  Orders Placed This Encounter  Procedures   LUMBAR FACET(MEDIAL BRANCH NERVE BLOCK) MBNB    Scheduling Instructions:     Procedure: Lumbar facet block (AKA.: Lumbosacral medial branch nerve block)     Side: Bilateral     Level: L4-5 and L5-S1 Facets (L3, L4, L5, and S1 Medial Branch Nerves)     Sedation: Patient's choice.     Timeframe: Today    Order Specific Question:   Where will this procedure be performed?    Answer:   [facility]    DG [facility] C-ARM 1-60 MIN NO REPORT    Intraoperative interpretation by procedural physician at [facility].    Standing Status:   Standing    Number of Occurrences:   1    Order Specific Question:   Reason for exam:    Answer:   Assistance in needle guidance and placement for procedures requiring needle placement in or near specific anatomical locations not easily accessible without such assistance.   Informed Consent Details: Physician/Practitioner Attestation; Transcribe to consent form and obtain patient signature    Nursing Order: Transcribe to consent form and obtain patient signature. Note: Always confirm laterality of pain with Ms. Laveda Norman, before procedure.    Order Specific Question:   Physician/Practitioner attestation of informed consent for procedure/surgical case    Answer:   I, the physician/practitioner, attest that I have discussed with the patient the benefits, risks, side effects, alternatives, likelihood of achieving goals and potential problems during recovery for the procedure that I have provided informed consent.    Order Specific Question:   Procedure    Answer:   Lumbar Facet Block  under fluoroscopic guidance    Order Specific Question:   Physician/Practitioner performing the procedure    Answer:   Symia Herdt A. Laban Emperor MD    Order Specific Question:   Indication/Reason    Answer:   Low Back Pain, with our without leg pain, due to Facet Joint Arthralgia (Joint Pain) Spondylosis (Arthritis of the Spine), without myelopathy or radiculopathy (Nerve Damage).   Provide equipment / supplies at bedside    Procedure tray: "Block Tray" (Disposable  single use) Skin infiltration needle: Regular 1.5-in, 25-G, (x1) Block Needle type: Spinal Amount/quantity: 4 Size: Medium (5-inch) Gauge: 22G    Standing Status:   Standing    Number of Occurrences:   1    Order Specific Question:   Specify    Answer:   Block Tray   Miscellanous precautions    Standing  Status:   Standing    Number of Occurrences:   1   Chronic Opioid Analgesic:  None MME/day: 0 mg/day   Medications ordered for procedure: Meds ordered this encounter  Medications   lidocaine (XYLOCAINE) 2 % (with pres) injection 400 mg   pentafluoroprop-tetrafluoroeth (GEBAUERS) aerosol   lactated ringers infusion   midazolam (VERSED) 5 MG/5ML injection 0.5-2 mg    Make sure Flumazenil is available in the pyxis when using this medication. If oversedation occurs, administer 0.2 mg IV over 15 sec. If after 45 sec no response, administer 0.2 mg again over 1 min; may repeat at 1 min intervals; not to exceed 4 doses (1 mg)   fentaNYL (SUBLIMAZE) injection 25-50 mcg    Make sure Narcan is available in the pyxis when using this medication. In the event of respiratory depression (RR< 8/min): Titrate NARCAN (naloxone) in increments of 0.1 to 0.2 mg  IV at 2-3 minute intervals, until desired degree of reversal.   ropivacaine (PF) 2 mg/mL (0.2%) (NAROPIN) injection 18 mL   triamcinolone acetonide (KENALOG-40) injection 80 mg   Medications administered: We administered lidocaine, pentafluoroprop-tetrafluoroeth, lactated ringers, midazolam, fentaNYL, ropivacaine (PF) 2 mg/mL (0.2%), and triamcinolone acetonide.  See the medical record for exact dosing, route, and time of administration.  Follow-up plan:   Return in about 2 weeks (around 10/12/2023) for (Face2F), (PPE).       Interventional Therapies  Risk Factors  Considerations  Medical Comorbidities:  COPD  BA  GERD  chronic smoker  paroxysmal vertigo  anxiety  depression  MO (BMI>35)     Planned  Pending:   Diagnostic bilateral (L3-4, L4-5, L5-S1) lumbar facet MBB #1 (08/17/2023)    Under consideration:   Diagnostic bilateral lumbar facet MBB #2  Possible candidate for lumbar facet RFA    Completed:   Diagnostic bilateral lumbar facet MBB x1 (08/17/2023) (100/100/100/0)    Therapeutic  Palliative (PRN) options:   None  established   Completed by other providers:   Diagnostic left L3-4, L4-5 IA facet joint inj. x1 (01/28/2016) by Windell Norfolk, MD (PMR) (6/10 to 0/10)  Diagnostic/therapeutic left glenohumeral (shoulder) joint inj. x1 (05/19/2021) by Dian Situ, MD         Recent Visits Date Type Provider Dept  08/31/23 Office Visit Delano Metz, MD [facility]  08/17/23 Procedure visit Delano Metz, MD [facility]  Showing recent visits within past 90 days and meeting all other requirements Today's Visits Date Type Provider Dept  09/28/23 Procedure visit Delano Metz, MD [facility]  Showing today's visits and meeting all other requirements Future Appointments Date Type Provider Dept  10/14/23 Appointment Delano Metz, MD [facility]  Showing future appointments within next 90 days and meeting all other requirements  Disposition: Discharge home  Discharge (Date  Time): 09/28/2023;   hrs.   Primary Care Physician: Remo Lipps, PA Location: [facility] Note by: Oswaldo Done, MD (TTS technology used. I apologize for any typographical errors that were not detected and corrected.) Date: 09/28/2023; Time: 10:32 AM  Disclaimer:  Medicine is not an Visual merchandiser. The only guarantee in medicine is that nothing is guaranteed. It is important to note that the decision to proceed with this intervention was based on the information collected from the patient. The Data and conclusions were drawn from the patient's questionnaire, the interview, and the physical examination. Because the information was provided in large part by the patient, it cannot be guaranteed that it has not been purposely or unconsciously manipulated. Every effort has been made to obtain as much relevant data as possible for this evaluation. It is important to note that the conclusions that lead to this procedure are derived  in large part from the available data. Always take into account that the treatment will also be dependent on availability of resources and existing treatment guidelines, considered by other Pain Management Practitioners as being common knowledge and practice, at the time of the intervention. For Medico-Legal purposes, it is also important to point out that variation in procedural techniques and pharmacological choices are the acceptable norm. The indications, contraindications, technique, and results of the above procedure should only be interpreted and judged by a Board-Certified Interventional Pain Specialist with extensive familiarity and expertise in the same exact procedure and technique.

## 2023-09-29 ENCOUNTER — Telehealth: Payer: Self-pay

## 2023-09-29 NOTE — Telephone Encounter (Signed)
Post procedure follow up.  LM 

## 2023-10-01 ENCOUNTER — Ambulatory Visit: Payer: 59 | Admitting: Physician Assistant

## 2023-10-14 ENCOUNTER — Ambulatory Visit: Payer: 59 | Attending: Pain Medicine | Admitting: Pain Medicine

## 2023-10-14 ENCOUNTER — Encounter: Payer: Self-pay | Admitting: Pain Medicine

## 2023-10-14 VITALS — BP 137/74 | HR 91 | Temp 97.5°F | Resp 18 | Ht 66.0 in | Wt 232.0 lb

## 2023-10-14 DIAGNOSIS — M545 Low back pain, unspecified: Secondary | ICD-10-CM | POA: Diagnosis present

## 2023-10-14 DIAGNOSIS — R937 Abnormal findings on diagnostic imaging of other parts of musculoskeletal system: Secondary | ICD-10-CM | POA: Diagnosis present

## 2023-10-14 DIAGNOSIS — G8929 Other chronic pain: Secondary | ICD-10-CM | POA: Insufficient documentation

## 2023-10-14 DIAGNOSIS — M5459 Other low back pain: Secondary | ICD-10-CM | POA: Diagnosis present

## 2023-10-14 DIAGNOSIS — Z09 Encounter for follow-up examination after completed treatment for conditions other than malignant neoplasm: Secondary | ICD-10-CM | POA: Diagnosis present

## 2023-10-14 DIAGNOSIS — S22080S Wedge compression fracture of T11-T12 vertebra, sequela: Secondary | ICD-10-CM | POA: Insufficient documentation

## 2023-10-14 DIAGNOSIS — M47816 Spondylosis without myelopathy or radiculopathy, lumbar region: Secondary | ICD-10-CM | POA: Insufficient documentation

## 2023-10-14 NOTE — Progress Notes (Signed)
PROVIDER NOTE: Information contained herein reflects review and annotations entered in association with encounter. Interpretation of such information and data should be left to medically-trained personnel. Information provided to patient can be located elsewhere in the medical record under "Patient Instructions". Document created using STT-dictation technology, any transcriptional errors that may result from process are unintentional.    Patient: Kelsey Rose  Service Category: E/M  Provider: Oswaldo Done, MD  DOB: 1961-06-07  DOS: 10/14/2023  Referring Provider: Remo Lipps, PA  MRN: [id number]  Specialty: Interventional Pain Management  PCP: Remo Lipps, PA  Type: Established Patient  Setting: Ambulatory outpatient    Location: Office  Delivery: Face-to-face     HPI  Ms. Jimisha Askeland, a 62 y.o. year old female, is here today because of her Chronic bilateral low back pain without sciatica [M54.50, G89.29]. Ms. Cure primary complain today is Back Pain and Hip Pain  Pertinent problems: Ms. Barbera has DDD (degenerative disc disease), lumbosacral; Chronic low back pain (1ry area of Pain) (Bilateral) (L>R) w/o sciatica; Pain in shoulder region after shoulder replacement (Right); 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; Chronic pain syndrome; Abnormal MRI, lumbar spine (06/17/2015); Chronic hip pain (4th area of Pain) (Bilateral) (L>R); Chronic shoulder pain (2ry area of Pain) (Bilateral) (L>R); History of total shoulder replacement (Right); Osteoarthritis involving multiple joints; Chronic neck pain (3ry area of Pain) (Midline); Abnormal x-ray of cervical spine; T12 wedge compression fracture, sequela; DDD (degenerative disc disease), cervical; Osteoarthritis of hips (Bilateral); Cervical foraminal stenosis (Right: CC4-5, C5-6, C6-7, C7-T1); Cervical facet hypertrophy; Cervical facet arthropathy; Cervical facet joint pain; Osteophytosis of shoulder (Left); Osteoarthritis of AC  (acromioclavicular) joint (Left); Lumbar facet arthropathy; Lumbar facet joint pain; Osteopenia determined by x-ray; Osteopenia of hips (Bilateral); Ligamentum flavum hypertrophy (L3-4, L4-5, L5-S1); DDD (degenerative disc disease), thoracic; Lumbar intervertebral disc protrusion (Left: L3-4, L4-5); Lumbar foraminal stenosis (Left: L3-4, L5-S1); Spondylosis without myelopathy or radiculopathy, lumbosacral region; and Lumbar facet joint syndrome on their pertinent problem list. Pain Assessment: Severity of Chronic pain is reported as a 4 /10. Location: Back Lower, Right, Left/radiates to hips bilateral (left worse) into buttocks. Onset: More than a month ago. Quality: Constant, Throbbing. Timing: Constant. Modifying factor(s): Laying down, lidocain patch, and tens unit. Vitals:  height is 5\' 6"  (1.676 m) and weight is 232 lb (105.2 kg). Her temporal temperature is 97.5 F (36.4 C) (abnormal). Her blood pressure is 137/74 and her pulse is 91. Her respiration is 18 and oxygen saturation is 100%.  BMI: Estimated body mass index is 37.45 kg/m as calculated from the following:   Height as of this encounter: 5\' 6"  (1.676 m).   Weight as of this encounter: 232 lb (105.2 kg). Last encounter: 08/31/2023. Last procedure: 09/28/2023.  Reason for encounter: post-procedure evaluation and assessment. Discussed the use of AI scribe software for clinical note transcription with the patient, who gave verbal consent to proceed.  History of Present Illness   The patient, with a history of chronic back pain, underwent a second bilateral lumbar facet medial branch block under fluoroscopic guidance on 09/28/23. She reported complete relief of pain during the duration of the local anesthetic, but no long-term benefit once the anesthetic wore off. The patient's pain appears to be mechanical rather than inflammatory in nature.  X-rays of the lumbar spine taken on 05/24/23 showed mildly increased wedging of the T12  vertebrobiotic compression fracture and mild multilevel degenerative changes of the lumbar spine, including moderate intervertebral disc space height loss at T12  L1, L3, 4. The last MRI of the lumbar spine was on 06/17/15, which showed advanced degenerative changes at T12 L1 level, L3, 4, L4, 5, and L5 S1 levels with mild left neuroforaminal narrowing at L3, 4 and L4, 5 levels. Ligamentum flavum hypertrophy and facet arthropathy were also noted from L3, 4 down to L5 S1.  Currently, the patient's pain is primarily located in the lower back, mostly on the left side. She describes a sensation of her hip 'fixing to go out' and has to physically support it when walking due to fear of it 'shooting out from under her.' She denies any leg pain or numbness. However, she reported experiencing spasms in her feet, which required her to get up and walk to alleviate. She also reported a sensation on the right side of her back, which she does not describe as pain but as a 'funny' feeling.  The patient's pain appears to be activity-related, worsening with tasks such as laundry, dishes, and walking. She also reported weight retention despite eating once a day and expressed concerns about potential side effects of her sleep medication, mirtazapine, contributing to weight gain. She has a BMI of 37.2 and is working on weight reduction.     Post-procedure evaluation   Type: Lumbar Facet, Medial Branch Block(s) (w/ fluoroscopic mapping) #2  Laterality: Bilateral  Level: L3, L4, L5, and S1 Medial Branch Level(s). Injecting these levels blocks the L4-5 and L5-S1 lumbar facet joints. Imaging: Fluoroscopic guidance Spinal ([id number]) Anesthesia: Local anesthesia (1-2% Lidocaine) Anxiolysis: IV Versed         Sedation: Moderate Sedation                       DOS: 09/28/2023 Performed by: Oswaldo Done, MD  Primary Purpose: Diagnostic/Therapeutic Indications: Low back pain severe enough to impact quality of life or  function. 1. Chronic low back pain (1ry area of Pain) (Bilateral) (L>R) w/o sciatica   2. Lumbar facet arthropathy   3. Lumbar facet joint pain   4. Lumbar facet joint syndrome   5. Spondylosis without myelopathy or radiculopathy, lumbosacral region   6. Degeneration of intervertebral disc of lumbosacral region with discogenic back pain   7. Abnormal MRI, lumbar spine (06/17/2015)   8. History of allergy to radiographic contrast media    NAS-11 Pain score:   Pre-procedure: 7 /10   Post-procedure: 0-No pain/10     Effectiveness:  Initial hour after procedure: 100 %. Subsequent 4-6 hours post-procedure: 100 %. Analgesia past initial 6 hours: 0 % (relief for 1 day only). Ongoing improvement:  Analgesic: The patient indicates having attained 100% relief of the pain for the duration of local anesthetic followed by no long-term benefit.  This would suggest that there is no inflammatory process to speak off and the pain is likely to be more mechanical.  Unfortunately, her BMI is 37.45 kg/m which is adversely contributing to her pain.  Having said this, further questioning has revealed that the pain that she was experiencing on the right side of her lower back is no longer there meaning that she has attained 100% relief of the pain on the right side of the lower back but she still having some pain on the left side.  Furthermore, it would seem to me that her left-sided low back pain has also improved, but I think that her expectations are to have 100% relief of the pain unless she gets that then the intervention has not really worked  for her. Function: No benefit ROM: No benefit   Pharmacotherapy Assessment  Analgesic: None MME/day: 0 mg/day   Monitoring: [place] PMP: PDMP reviewed during this encounter.       Pharmacotherapy: No side-effects or adverse reactions reported. Compliance: No problems identified. Effectiveness: Clinically acceptable.  Earlyne Iba, RN  10/14/2023  1:45 PM  Sign when  Signing Visit Safety precautions to be maintained throughout the outpatient stay will include: orient to surroundings, keep bed in low position, maintain call bell within reach at all times, provide assistance with transfer out of bed and ambulation.     No results found for: "CBDTHCR" No results found for: "D8THCCBX" No results found for: "D9THCCBX"  UDS:  Summary  Date Value Ref Range Status  05/24/2023 Note  Final    Comment:    ==================================================================== Compliance Drug Analysis, Ur ==================================================================== Test                             Result       Flag       Units  Drug Present and Declared for Prescription Verification   Citalopram                     PRESENT      EXPECTED   Desmethylcitalopram            PRESENT      EXPECTED    Desmethylcitalopram is an expected metabolite of citalopram or the    enantiomeric form, escitalopram.  Drug Present not Declared for Prescription Verification   Mirtazapine                    PRESENT      UNEXPECTED  Drug Absent but Declared for Prescription Verification   Bupropion                      Not Detected UNEXPECTED   Lidocaine                      Not Detected UNEXPECTED    Lidocaine, as indicated in the declared medication list, is not    always detected even when used as directed.    Dextromethorphan               Not Detected UNEXPECTED ==================================================================== Test                      Result    Flag   Units      Ref Range   Creatinine              103              mg/dL      >=64 ==================================================================== Declared Medications:  The flagging and interpretation on this report are based on the  following declared medications.  Unexpected results may arise from  inaccuracies in the declared medications.   **Note: The testing scope of this panel includes  these medications:   Bupropion (Auvelity)  Citalopram (Celexa)  Dextromethorphan (Auvelity)   **Note: The testing scope of this panel does not include small to  moderate amounts of these reported medications:   Topical Lidocaine (Lidoderm)   **Note: The testing scope of this panel does not include the  following reported medications:   Albuterol (Ventolin HFA)  Albuterol (Airsupra)  Alendronate (Fosamax)  Atorvastatin (Lipitor)  Bismuth subsalicylate (Pepto-bismol)  Budesonide (Airsupra)  Loperamide (Imodium)  Nystatin (Mycostatin)  Olodaterol (Stiolto Respimat)  Ondansetron (Zofran)  Tiotropium (Stiolto Respimat) ==================================================================== For clinical consultation, please call [phone]. ====================================================================       ROS  Constitutional: Denies any fever or chills Gastrointestinal: No reported hemesis, hematochezia, vomiting, or acute GI distress Musculoskeletal: Denies any acute onset joint swelling, redness, loss of ROM, or weakness Neurological: No reported episodes of acute onset apraxia, aphasia, dysarthria, agnosia, amnesia, paralysis, loss of coordination, or loss of consciousness  Medication Review  Tiotropium Bromide-Olodaterol, albuterol, alendronate, atorvastatin, citalopram, lidocaine, naproxen sodium, nystatin, and ondansetron  History Review  Allergy: Ms. Lupoli is allergic to sulfa antibiotics, iodinated contrast media, and penicillins. Drug: Ms. Ketchem  reports that she does not currently use drugs. Alcohol:  reports no history of alcohol use. Tobacco:  reports that she has been smoking cigarettes. She has a 53 pack-year smoking history. She does not have any smokeless tobacco history on file. Social: Ms. Mesman  reports that she has been smoking cigarettes. She has a 53 pack-year smoking history. She does not have any smokeless tobacco history on file. She reports  that she does not currently use drugs. She reports that she does not drink alcohol. Medical:  has a past medical history of Anxiety, Arthritis, Asthma, COPD (chronic obstructive pulmonary disease) (HCC), Depression, and GERD (gastroesophageal reflux disease). Surgical: Ms. Dhanani  has a past surgical history that includes Tonsillectomy and adenoidectomy (Age 40); Abdominal hysterectomy (62 yrs old); Joint replacement (Right,  May 2015); Colonoscopy with propofol (N/A, 09/16/2023); and biopsy (09/16/2023). Family: family history includes COPD in her sister and sister; Cirrhosis in her father; Dementia in her mother; Depression in her sister and sister; Diabetes in her daughter; Gallstones in her mother; Heart attack in her brother; Osteoarthritis in her mother.  Laboratory Chemistry Profile   Renal Lab Results  Component Value Date   BUN 6 (L) 05/24/2023   CREATININE 0.77 05/24/2023   BCR 8 (L) 05/24/2023    Hepatic Lab Results  Component Value Date   AST 14 05/24/2023   ALT 13 01/19/2023   ALBUMIN 4.0 05/24/2023   ALKPHOS 121 05/24/2023    Electrolytes Lab Results  Component Value Date   NA 144 05/24/2023   K 4.4 05/24/2023   CL 105 05/24/2023   CALCIUM 9.5 05/24/2023   MG 1.9 05/24/2023    Bone Lab Results  Component Value Date   VD25OH 37.7 01/19/2023   25OHVITD1 34 05/24/2023   25OHVITD2 <1.0 05/24/2023   25OHVITD3 34 05/24/2023    Inflammation (CRP: Acute Phase) (ESR: Chronic Phase) Lab Results  Component Value Date   CRP 1 05/24/2023   ESRSEDRATE 24 05/24/2023         Note: Above Lab results reviewed.  Recent Imaging Review  DG PAIN CLINIC C-ARM 1-60 MIN NO REPORT Fluoro was used, but no Radiologist interpretation will be provided.  Please refer to "NOTES" tab for provider progress note. Note: Reviewed        Physical Exam  General appearance: Well nourished, well developed, and well hydrated. In no apparent acute distress Mental status: Alert, oriented x 3  (person, place, & time)       Respiratory: No evidence of acute respiratory distress Eyes: PERLA Vitals: BP 137/74   Pulse 91   Temp (!) 97.5 F (36.4 C) (Temporal)   Resp 18   Ht 5\' 6"  (1.676 m)   Wt 232 lb (105.2 kg)   SpO2 100%   BMI 37.45  kg/m  BMI: Estimated body mass index is 37.45 kg/m as calculated from the following:   Height as of this encounter: 5\' 6"  (1.676 m).   Weight as of this encounter: 232 lb (105.2 kg). Ideal: Ideal body weight: 59.3 kg (130 lb 11.7 oz) Adjusted ideal body weight: 77.7 kg (171 lb 3.8 oz)  Physical Exam   MEASUREMENTS: BMI- 37.2     Assessment   Diagnosis Status  1. Chronic low back pain (1ry area of Pain) (Bilateral) (L>R) w/o sciatica   2. Lumbar facet joint pain   3. Lumbar facet joint syndrome   4. Postop check   5. Abnormal MRI, lumbar spine (06/17/2015)   6. T12 wedge compression fracture, sequela    Improved Improved Improved   Updated Problems: No problems updated.  Plan of Care  Problem-specific:  Assessment and Plan    Chronic Low Back Pain with Lumbar Facet Arthropathy   She reported 100% pain relief during the local anesthetic following the second bilateral lumbar facet medial branch block on 09/28/2023, but experienced no long-term benefit post-anesthetic. Her pain, primarily in the lower back and mostly on the left side, occurs without leg pain, numbness, or weakness. X-rays from 05/24/2023 revealed mildly increased wedging of the T12 vertebra and mild multilevel degenerative changes. An MRI from 06/17/2015 showed advanced degenerative changes at T12-L1, L3-4, L4-5, and L5-S1 with mild left neuroforaminal narrowing at L3-4 and L4-5, ligamentum flavum hypertrophy, and facet arthropathy from L3-4 to L5-S1, suggesting mechanical rather than inflammatory pain, as steroids did not provide long-term relief. We will order an MRI of the lumbar spine, continue weight management efforts, discuss potential medication changes with her  primary care physician to address the weight gain side effect of mirtazapine, and follow up with MRI results.  Obesity   Her BMI is 37.2, and she reports difficulty losing weight despite eating once a day while on mirtazapine, which has a side effect of weight gain. We discussed the potential referral to weight management and a nutritionist if insurance allows but she declined bariatric surgery. We will continue working on weight reduction with a goal BMI of 30 or less, discuss potential medication changes with her primary care physician to address the weight gain side effect of mirtazapine, and consider referral to weight management and a nutritionist if insurance allows.  General Health Maintenance   We advised her to continue weight management and follow up with her primary care physician regarding medication side effects. We will continue weight management efforts and discuss potential medication changes with her primary care physician.  Follow-up   She should call the clinic to set up an appointment once MRI results are available.     Ms. Jaylinne Slappey has a current medication list which includes the following long-term medication(s): albuterol, albuterol, atorvastatin, and citalopram.  Pharmacotherapy (Medications Ordered): No orders of the defined types were placed in this encounter.  Orders:  Orders Placed This Encounter  Procedures   MR LUMBAR SPINE WO CONTRAST    Patient presents with axial pain with possible radicular component. Please assist [place] in identifying specific level(s) and laterality of any additional findings such as: 1. Facet (Zygapophyseal) joint DJD (Hypertrophy, space narrowing, subchondral sclerosis, and/or osteophyte formation) 2. DDD and/or IVDD (Loss of disc height, desiccation, gas patterns, osteophytes, endplate sclerosis, or "Black disc disease") 3. Pars defects 4. Spondylolisthesis, spondylosis, and/or spondyloarthropathies (include Degree/Grade of  displacement in mm) (stability) 5. Vertebral body Fractures (acute/chronic) (state percentage of collapse) 6. Demineralization (osteopenia/osteoporotic)  7. Bone pathology 8. Foraminal narrowing  9. Surgical changes 10. Central, Lateral Recess, and/or Foraminal Stenosis (include AP diameter of stenosis in mm) 11. Surgical changes (hardware type, status, and presence of fibrosis) 12. Modic Type Changes (MRI only) 13. IVDD (Disc bulge, protrusion, herniation, extrusion) (Level, laterality, extent)    Standing Status:   Future    Standing Expiration Date:   01/12/2024    Scheduling Instructions:     Please make sure that the patient understands that this needs to be done as soon as possible. Never have the patient do the imaging "just before the next appointment". Inform patient that having the imaging done within the [facility] will expedite the availability of the results and will provide      imaging availability to the requesting physician. In addition inform the patient that the imaging order has an expiration date and will not be renewed if not done within the active period.    Order Specific Question:   What is the patient's sedation requirement?    Answer:   No Sedation    Order Specific Question:   Does the patient have a pacemaker or implanted devices?    Answer:   No    Order Specific Question:   Preferred imaging location?    Answer:   [facility] (table limit-350lbs)    Order Specific Question:   Call Results- Best Contact Number?    Answer:   [phone] [facility]    Order Specific Question:   Radiology Contrast Protocol - do NOT remove file path    Answer:   [email or web address]   Nursing Instructions:    Please complete this patient's postprocedure evaluation.    Scheduling Instructions:     Please complete this patient's postprocedure evaluation.   Follow-up plan:   Return if symptoms  worsen or fail to improve.      Interventional Therapies  Risk Factors  Considerations  Medical Comorbidities:  COPD  BA  GERD  chronic smoker  paroxysmal vertigo  anxiety  depression  MO (BMI>35)     Planned  Pending:   MRI of the lumbar spine ordered on 10/14/2023   Under consideration:   Diagnostic bilateral lumbar facet RFA #1     Completed:   Diagnostic bilateral lumbar facet MBB x2 (09/28/2023) (100/100/100/L:0R:100)    Therapeutic  Palliative (PRN) options:   None established   Completed by other providers:   Diagnostic left L3-4, L4-5 IA facet joint inj. x1 (01/28/2016) by Windell Norfolk, MD (PMR) (6/10 to 0/10)  Diagnostic/therapeutic left glenohumeral (shoulder) joint inj. x1 (05/19/2021) by Dian Situ, MD       Recent Visits Date Type Provider Dept  09/28/23 Procedure visit Delano Metz, MD [facility]  08/31/23 Office Visit Delano Metz, MD [facility]  08/17/23 Procedure visit Delano Metz, MD [facility]  Showing recent visits within past 90 days and meeting all other requirements Today's Visits Date Type Provider Dept  10/14/23 Office Visit Delano Metz, MD [facility]  Showing today's visits and meeting all other requirements Future Appointments No visits were found meeting these conditions. Showing future appointments within next 90 days and meeting all other requirements  I discussed the assessment and treatment plan with the patient. The patient was provided an opportunity to ask questions and all were answered. The patient agreed with the plan and demonstrated an understanding of the instructions.  Patient advised to call back or  seek an in-person evaluation if the symptoms or condition worsens.  Duration of encounter: 32 minutes.  Total time on encounter, as per AMA guidelines included both the face-to-face and non-face-to-face time personally spent by the physician  and/or other qualified health care professional(s) on the day of the encounter (includes time in activities that require the physician or other qualified health care professional and does not include time in activities normally performed by clinical staff). Physician's time may include the following activities when performed: Preparing to see the patient (e.g., pre-charting review of records, searching for previously ordered imaging, lab work, and nerve conduction tests) Review of prior analgesic pharmacotherapies. Reviewing PMP Interpreting ordered tests (e.g., lab work, imaging, nerve conduction tests) Performing post-procedure evaluations, including interpretation of diagnostic procedures Obtaining and/or reviewing separately obtained history Performing a medically appropriate examination and/or evaluation Counseling and educating the patient/family/caregiver Ordering medications, tests, or procedures Referring and communicating with other health care professionals (when not separately reported) Documenting clinical information in the electronic or other health record Independently interpreting results (not separately reported) and communicating results to the patient/ family/caregiver Care coordination (not separately reported)  Note by: Oswaldo Done, MD Date: 10/14/2023; Time: 2:36 PM

## 2023-10-14 NOTE — Progress Notes (Signed)
Safety precautions to be maintained throughout the outpatient stay will include: orient to surroundings, keep bed in low position, maintain call bell within reach at all times, provide assistance with transfer out of bed and ambulation.  

## 2023-10-20 ENCOUNTER — Other Ambulatory Visit: Payer: Self-pay | Admitting: Physician Assistant

## 2023-10-20 DIAGNOSIS — E782 Mixed hyperlipidemia: Secondary | ICD-10-CM

## 2023-10-21 ENCOUNTER — Ambulatory Visit
Admission: RE | Admit: 2023-10-21 | Discharge: 2023-10-21 | Disposition: A | Discharge status: Home / Self Care | Payer: 59 | Source: Ambulatory Visit | Attending: Pain Medicine | Admitting: Pain Medicine

## 2023-10-21 DIAGNOSIS — S22080S Wedge compression fracture of T11-T12 vertebra, sequela: Secondary | ICD-10-CM | POA: Diagnosis present

## 2023-10-21 DIAGNOSIS — M5459 Other low back pain: Secondary | ICD-10-CM | POA: Insufficient documentation

## 2023-10-21 DIAGNOSIS — M47816 Spondylosis without myelopathy or radiculopathy, lumbar region: Secondary | ICD-10-CM | POA: Insufficient documentation

## 2023-10-21 DIAGNOSIS — M545 Low back pain, unspecified: Secondary | ICD-10-CM | POA: Diagnosis present

## 2023-10-21 DIAGNOSIS — G8929 Other chronic pain: Secondary | ICD-10-CM | POA: Diagnosis present

## 2023-10-21 DIAGNOSIS — R937 Abnormal findings on diagnostic imaging of other parts of musculoskeletal system: Secondary | ICD-10-CM | POA: Diagnosis present

## 2023-10-21 NOTE — Telephone Encounter (Signed)
Requested Prescriptions  Pending Prescriptions Disp Refills   atorvastatin (LIPITOR) 20 MG tablet [Pharmacy Med Name: ATORVASTATIN 20MG  TABLETS] 90 tablet 0    Sig: TAKE 1 TABLET(20 MG) BY MOUTH DAILY     Cardiovascular:  Antilipid - Statins Failed - 10/21/2023  8:23 AM      Failed - Lipid Panel in normal range within the last 12 months    Cholesterol, Total  Date Value Ref Range Status  08/16/2023 168 100 - 199 mg/dL Final   LDL Chol Calc (NIH)  Date Value Ref Range Status  08/16/2023 88 0 - 99 mg/dL Final   HDL  Date Value Ref Range Status  08/16/2023 49 >39 mg/dL Final   Triglycerides  Date Value Ref Range Status  08/16/2023 179 (H) 0 - 149 mg/dL Final         Passed - Patient is not pregnant      Passed - Valid encounter within last 12 months    Recent Outpatient Visits           2 months ago Mixed hyperlipidemia   [facility] at [facility], [place], [place]   4 months ago Moderate recurrent major depression [facility])   [facility] at [facility], [place], [place]   6 months ago Moderate recurrent major depression [facility])   [facility] at [facility], [place], [place]   7 months ago Insomnia due to other mental disorder   [facility] at [facility], [place], [place]   8 months ago Insomnia due to other mental disorder   [facility] at [facility], [place] Al[place], [place]       Future Appointments             In 3 weeks Mordecai Maes, [place], [place] [facility] at [facility], [facility]   In 2 months Mordecai Maes, [place] Alar, [place] [facility] at [facility], [facility]

## 2023-11-12 ENCOUNTER — Ambulatory Visit: Payer: 59 | Admitting: Physician Assistant

## 2023-11-16 ENCOUNTER — Ambulatory Visit: Payer: 59 | Admitting: Physician Assistant

## 2023-11-21 NOTE — Progress Notes (Signed)
 PROVIDER NOTE: Information contained herein reflects review and annotations entered in association with encounter. Interpretation of such information and data should be left to medically-trained personnel. Information provided to patient can be located elsewhere in the medical record under Patient Instructions. Document created using STT-dictation technology, any transcriptional errors that may result from process are unintentional.    Patient: Kelsey Rose  Service Category: E/M  Provider: Eric DELENA Como, MD  DOB: 02-Jan-1961  DOS: 11/22/2023  Referring Provider: Manya Toribio SQUIBB, PA  MRN: [id number]  Specialty: Interventional Pain Management  PCP: Manya Toribio SQUIBB, PA  Type: Established Patient  Setting: Ambulatory outpatient    Location: Office  Delivery: Face-to-face     HPI  Ms. Kelsey Rose, a 63 y.o. year old female, is here today because of her Abnormal MRI, lumbar spine [R93.7]. Ms. Kelsey Rose primary complain today is Back Pain  Pertinent problems: Ms. Kelsey Rose has DDD (degenerative disc disease), lumbosacral; Chronic low back pain (1ry area of Pain) (Bilateral) (L>R) w/o sciatica; Pain in shoulder region after shoulder replacement (Right); 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; Chronic pain syndrome; Abnormal MRI, lumbar spine (06/17/2015); Chronic hip pain (4th area of Pain) (Bilateral) (L>R); Chronic shoulder pain (2ry area of Pain) (Bilateral) (L>R); History of total shoulder replacement (Right); Osteoarthritis involving multiple joints; Chronic neck pain (3ry area of Pain) (Midline); Abnormal x-ray of cervical spine; T12 wedge compression fracture, sequela; DDD (degenerative disc disease), cervical; Osteoarthritis of hips (Bilateral); Cervical foraminal stenosis (Right: CC4-5, C5-6, C6-7, C7-T1); Cervical facet hypertrophy; Cervical facet arthropathy; Cervical facet joint pain; Osteophytosis of shoulder (Left); Osteoarthritis of AC (acromioclavicular) joint (Left); Lumbar facet  arthropathy; Lumbar facet joint pain; Osteopenia determined by x-ray; Osteopenia of hips (Bilateral); Ligamentum flavum hypertrophy (L3-4, L4-5, L5-S1); DDD (degenerative disc disease), thoracic; Lumbar intervertebral disc protrusion (Left: L3-4, L4-5); Lumbar foraminal stenosis (Left: L3-4, L5-S1); Spondylosis without myelopathy or radiculopathy, lumbosacral region; and Lumbar facet joint syndrome on their pertinent problem list. Pain Assessment: Severity of Chronic pain is reported as a 4 /10. Location: Back Lower, Right, Left/radiates to hips bilateral (left worse) into buttocks. Onset: More than a month ago. Quality: Constant, Throbbing. Timing: Constant. Modifying factor(s): Laying down, lidocain patch, ice pack, and tens unit. Vitals:  height is 5' 6 (1.676 m) and weight is 232 lb (105.2 kg). Her temporal temperature is 97.3 F (36.3 C) (abnormal). Her blood pressure is 140/93 (abnormal) and her pulse is 95. Her respiration is 16 and oxygen saturation is 98%.  BMI: Estimated body mass index is 37.45 kg/m as calculated from the following:   Height as of this encounter: 5' 6 (1.676 m).   Weight as of this encounter: 232 lb (105.2 kg). Last encounter: 10/14/2023. Last procedure: 09/28/2023.  Reason for encounter: follow-up evaluation after MRI.  (11/03/2023) LUMBAR MRI FINDINGS: Alignment: Trace retrolisthesis of T12 on L1. Mild dextrocurvature. Paraspinal and other soft tissues: Enlargement of the infrarenal abdominal aorta, which measures up to 2.8 cm, compared to 1.8 cm more proximally. Atrophy of the posteroinferior paraspinous muscles.    DISC LEVELS: T12-L1: Disc desiccation and mild disc bulge. Mild facet arthropathy. Trace retrolisthesis.  L3-4: Minimal disc bulge.  L4-5: Small right foraminal protrusion.  L5-S1: Minimal disc bulge.  Discussed the use of AI scribe software for clinical note transcription with the patient, who gave verbal consent to proceed.  History of Present  Illness   The patient presents with a history of back pain, which is attributed to minimal disc bulges at L3, 4, and L5,  and a small right foraminal protrusion. There is no spinal canal or neuroforaminal narrowing. The patient also has retrolysis of T12 over L1, which is causing stress on the facet joints and contributing to the back pain.  In addition to the back pain, the patient has been diagnosed with an abdominal aortic aneurysm.  The patient has been experiencing abdominal discomfort, which she initially attributed to gallbladder issues. However, given the diagnosis of the aneurysm, the patient has been advised to take this symptom seriously and follow up with her primary care physician.  The patient has been managing her back pain with lidocaine  patches and a TENS unit. Steroid treatment was attempted but was not successful.  The patient has expressed a desire to join a gym and start swimming as a form of exercise, as it is beneficial for osteoarthritis. The patient has a number of appointments scheduled for the following month and plans to discuss these issues further with her primary care physician.      Pharmacotherapy Assessment  Analgesic: None MME/day: 0 mg/day   Monitoring: [place] PMP: PDMP reviewed during this encounter.       Pharmacotherapy: No side-effects or adverse reactions reported. Compliance: No problems identified. Effectiveness: Clinically acceptable.  Bonner Norris, RN  11/22/2023  2:56 PM  Sign when Signing Visit Safety precautions to be maintained throughout the outpatient stay will include: orient to surroundings, keep bed in low position, maintain call bell within reach at all times, provide assistance with transfer out of bed and ambulation.     No results found for: CBDTHCR No results found for: D8THCCBX No results found for: D9THCCBX  UDS:  Summary  Date Value Ref Range Status  05/24/2023 Note  Final    Comment:     ==================================================================== Compliance Drug Analysis, Ur ==================================================================== Test                             Result       Flag       Units  Drug Present and Declared for Prescription Verification   Citalopram                      PRESENT      EXPECTED   Desmethylcitalopram            PRESENT      EXPECTED    Desmethylcitalopram is an expected metabolite of citalopram  or the    enantiomeric form, escitalopram.  Drug Present not Declared for Prescription Verification   Mirtazapine                     PRESENT      UNEXPECTED  Drug Absent but Declared for Prescription Verification   Bupropion                       Not Detected UNEXPECTED   Lidocaine                       Not Detected UNEXPECTED    Lidocaine , as indicated in the declared medication list, is not    always detected even when used as directed.    Dextromethorphan               Not Detected UNEXPECTED ==================================================================== Test                      Result    Flag  Units      Ref Range   Creatinine              103              mg/dL      >=79 ==================================================================== Declared Medications:  The flagging and interpretation on this report are based on the  following declared medications.  Unexpected results may arise from  inaccuracies in the declared medications.   **Note: The testing scope of this panel includes these medications:   Bupropion  (Auvelity )  Citalopram  (Celexa )  Dextromethorphan (Auvelity )   **Note: The testing scope of this panel does not include small to  moderate amounts of these reported medications:   Topical Lidocaine  (Lidoderm )   **Note: The testing scope of this panel does not include the  following reported medications:   Albuterol  (Ventolin  HFA)  Albuterol  (Airsupra )  Alendronate  (Fosamax )  Atorvastatin  (Lipitor)   Bismuth subsalicylate (Pepto-bismol)  Budesonide (Airsupra )  Loperamide (Imodium)  Nystatin  (Mycostatin )  Olodaterol (Stiolto Respimat )  Ondansetron (Zofran)  Tiotropium (Stiolto Respimat ) ==================================================================== For clinical consultation, please call [phone]. ====================================================================       ROS  Constitutional: Denies any fever or chills Gastrointestinal: No reported hemesis, hematochezia, vomiting, or acute GI distress Musculoskeletal: Denies any acute onset joint swelling, redness, loss of ROM, or weakness Neurological: No reported episodes of acute onset apraxia, aphasia, dysarthria, agnosia, amnesia, paralysis, loss of coordination, or loss of consciousness  Medication Review  Tiotropium Bromide-Olodaterol, albuterol , alendronate , atorvastatin , citalopram , lidocaine , naproxen sodium, nystatin , and ondansetron  History Review  Allergy: Ms. Kelsey Rose is allergic to sulfa antibiotics, iodinated contrast media, and penicillins. Drug: Ms. Kelsey Rose  reports that she does not currently use drugs. Alcohol:  reports no history of alcohol use. Tobacco:  reports that she has been smoking cigarettes. She has a 53 pack-year smoking history. She does not have any smokeless tobacco history on file. Social: Ms. Kelsey Rose  reports that she has been smoking cigarettes. She has a 53 pack-year smoking history. She does not have any smokeless tobacco history on file. She reports that she does not currently use drugs. She reports that she does not drink alcohol. Medical:  has a past medical history of Anxiety, Arthritis, Asthma, COPD (chronic obstructive pulmonary disease) (HCC), Depression, and GERD (gastroesophageal reflux disease). Surgical: Ms. Kelsey Rose  has a past surgical history that includes Tonsillectomy and adenoidectomy (Age 44); Abdominal hysterectomy (63 yrs old); Joint replacement (Right,  May 2015);  Colonoscopy with propofol  (N/A, 09/16/2023); and biopsy (09/16/2023). Family: family history includes COPD in her sister and sister; Cirrhosis in her father; Dementia in her mother; Depression in her sister and sister; Diabetes in her daughter; Gallstones in her mother; Heart attack in her brother; Osteoarthritis in her mother.  Laboratory Chemistry Profile   Renal Lab Results  Component Value Date   BUN 6 (L) 05/24/2023   CREATININE 0.77 05/24/2023   BCR 8 (L) 05/24/2023    Hepatic Lab Results  Component Value Date   AST 14 05/24/2023   ALT 13 01/19/2023   ALBUMIN 4.0 05/24/2023   ALKPHOS 121 05/24/2023    Electrolytes Lab Results  Component Value Date   NA 144 05/24/2023   K 4.4 05/24/2023   CL 105 05/24/2023   CALCIUM  9.5 05/24/2023   MG 1.9 05/24/2023    Bone Lab Results  Component Value Date   VD25OH 37.7 01/19/2023   25OHVITD1 34 05/24/2023   25OHVITD2 <1.0 05/24/2023   25OHVITD3 34 05/24/2023    Inflammation (CRP:  Acute Phase) (ESR: Chronic Phase) Lab Results  Component Value Date   CRP 1 05/24/2023   ESRSEDRATE 24 05/24/2023         Note: Above Lab results reviewed.  Recent Imaging Review  MR LUMBAR SPINE WO CONTRAST CLINICAL DATA:  Low back pain, radiating to left hip/buttock  EXAM: MRI LUMBAR SPINE WITHOUT CONTRAST  TECHNIQUE: Multiplanar, multisequence MR imaging of the lumbar spine was performed. No intravenous contrast was administered.  COMPARISON:  None Available.  FINDINGS: Segmentation:  5 lumbar type vertebral bodies.  Alignment: Trace retrolisthesis of T12 on L1. Mild dextrocurvature.  Vertebrae: No acute fracture, evidence of discitis, or suspicious osseous lesion.  Conus medullaris and cauda equina: Conus extends to the L2 level. Conus and cauda equina appear normal.  Paraspinal and other soft tissues: Enlargement of the infrarenal abdominal aorta, which measures up to 2.8 cm, compared to 1.8 cm more proximally. Atrophy of the  posteroinferior paraspinous muscles. No lymphadenopathy.  Disc levels:  T12-L1: Disc desiccation and mild disc bulge. Mild facet arthropathy. Trace retrolisthesis. No spinal canal stenosis or neural foraminal narrowing.  L1-L2: No significant disc bulge. No spinal canal stenosis or neural foraminal narrowing.  L2-L3: No significant disc bulge. No spinal canal stenosis or neural foraminal narrowing.  L3-L4: Minimal disc bulge. No spinal canal stenosis or neural foraminal narrowing.  L4-L5: Small right foraminal protrusion. No spinal canal stenosis or neural foraminal narrowing.  L5-S1: Minimal disc bulge. No spinal canal stenosis or neural foraminal narrowing.  IMPRESSION: 1. No spinal canal stenosis or neural foraminal narrowing. 2. Enlargement of the infrarenal abdominal aorta, which measures up to 2.8 cm, compared to 1.8 cm more proximally. Recommend follow-up ultrasound every 5 years. (Ref.: J Vasc Surg. 2018; 67:2-77 and J Am Coll Radiol 2013;10(10):789-794.)  Electronically Signed   By: Donald Campion M.D.   On: 11/03/2023 21:49 Note: Reviewed        Physical Exam  General appearance: Well nourished, well developed, and well hydrated. In no apparent acute distress Mental status: Alert, oriented x 3 (person, place, & time)       Respiratory: No evidence of acute respiratory distress Eyes: PERLA Vitals: BP (!) 140/93 (Cuff Size: Normal)   Pulse 95   Temp (!) 97.3 F (36.3 C) (Temporal)   Resp 16   Ht 5' 6 (1.676 m)   Wt 232 lb (105.2 kg)   SpO2 98%   BMI 37.45 kg/m  BMI: Estimated body mass index is 37.45 kg/m as calculated from the following:   Height as of this encounter: 5' 6 (1.676 m).   Weight as of this encounter: 232 lb (105.2 kg). Ideal: Ideal body weight: 59.3 kg (130 lb 11.7 oz) Adjusted ideal body weight: 77.7 kg (171 lb 3.8 oz)  Assessment   Diagnosis Status  1. Abnormal MRI, lumbar spine (06/17/2015)   2. Chronic low back pain (1ry area  of Pain) (Bilateral) (L>R) w/o sciatica   3. Degeneration of intervertebral disc of lumbosacral region with discogenic back pain    Controlled Controlled Controlled   Updated Problems: No problems updated.   Plan of Care  Problem-specific:  Assessment and Plan    Minimal Disc Bulges and Retrolysis MRI shows minimal disc bulges at L3-L4 and L5 without spinal canal or neuroforaminal stenosis. Retrolysis of T12 over L1 contributes to back pain due to stress on facet joints. No surgical intervention is needed. Pain management will focus on non-surgical options. Daily aches and pains are likely due to arthritis  and not suitable for steroid treatment. Non-pharmacological pain management strategies are emphasized. Continue using lidocaine  patches, ice, and TENS unit for pain management. Consider corticosteroid injections for flare-ups involving leg pain and weakness, but avoid chronic steroid use due to potential adverse effects.  Osteoarthritis This chronic condition contributes to daily aches and pains. Swimming is recommended as a low-impact exercise to manage symptoms without worsening joint issues. The benefits of water exercise in reducing joint stress while maintaining muscle strength were discussed. Join a swimming program at the [facility] for low-impact exercise.  Abdominal Aortic Aneurysm MRI indicates an enlargement consistent with an abdominal aortic aneurysm. No immediate intervention is required, but close monitoring is essential. Emphasis is placed on controlling blood pressure and avoiding factors contributing to atherosclerosis. The life-threatening risk if the aneurysm dissects was explained, along with the importance of lifestyle modifications to prevent progression. Follow up with the primary care physician for aneurysm management, monitor blood pressure regularly, avoid smoking, reduce salt intake, and maintain a healthy diet.  General Health Maintenance Lifestyle modifications are  emphasized to manage overall health and prevent complications related to existing conditions. Regular monitoring and control of blood pressure, maintaining a healthy diet, and avoiding smoking were discussed. Maintain a healthy diet, monitor and control blood pressure, and avoid smoking.  Goals of Care There is readiness for end-of-life planning, and cremation arrangements were discussed. Emphasis is on maintaining quality of life and avoiding premature decline. The emotional impact of losing her daughter was discussed, highlighting the importance of engaging in activities that promote physical and mental well-being. Proceed with cremation arrangements as planned and engage in activities that promote physical and mental well-being.  Follow-up Follow up with the primary care physician on Wednesday.      Ms. Kelsey Rose has a current medication list which includes the following long-term medication(s): albuterol , albuterol , atorvastatin , and citalopram .  Pharmacotherapy (Medications Ordered): No orders of the defined types were placed in this encounter.  Orders:  No orders of the defined types were placed in this encounter.  Follow-up plan:   Return if symptoms worsen or fail to improve.      Interventional Therapies  Risk Factors  Considerations  Medical Comorbidities:  COPD  BA  GERD  chronic smoker  paroxysmal vertigo  anxiety  depression  MO (BMI>35)     Planned  Pending:      Under consideration:   Diagnostic bilateral lumbar facet RFA #1     Completed:   Diagnostic bilateral lumbar facet MBB x2 (09/28/2023) (100/100/100/L:0R:100)    Therapeutic  Palliative (PRN) options:   None established   Completed by other providers:   Diagnostic left L3-4, L4-5 IA facet joint inj. x1 (01/28/2016) by Renato Pale, MD (PMR) (6/10 to 0/10)  Diagnostic/therapeutic left glenohumeral (shoulder) joint inj. x1 (05/19/2021) by Dwan Evalene PARAS, MD       Recent  Visits Date Type Provider Dept  11/22/23 Office Visit Tanya Glisson, MD [facility]  10/14/23 Office Visit Tanya Glisson, MD [facility]  09/28/23 Procedure visit Tanya Glisson, MD [facility]  08/31/23 Office Visit Tanya Glisson, MD [facility]  Showing recent visits within past 90 days and meeting all other requirements Future Appointments No visits were found meeting these conditions. Showing future appointments within next 90 days and meeting all other requirements  I discussed the assessment and treatment plan with the patient. The patient was provided an opportunity to ask questions and all were answered. The patient agreed  with the plan and demonstrated an understanding of the instructions.  Patient advised to call back or seek an in-person evaluation if the symptoms or condition worsens.  Duration of encounter: 30 minutes.  Total time on encounter, as per AMA guidelines included both the face-to-face and non-face-to-face time personally spent by the physician and/or other qualified health care professional(s) on the day of the encounter (includes time in activities that require the physician or other qualified health care professional and does not include time in activities normally performed by clinical staff). Physician's time may include the following activities when performed: Preparing to see the patient (e.g., pre-charting review of records, searching for previously ordered imaging, lab work, and nerve conduction tests) Review of prior analgesic pharmacotherapies. Reviewing PMP Interpreting ordered tests (e.g., lab work, imaging, nerve conduction tests) Performing post-procedure evaluations, including interpretation of diagnostic procedures Obtaining and/or reviewing separately obtained history Performing a medically appropriate examination and/or evaluation Counseling and educating the  patient/family/caregiver Ordering medications, tests, or procedures Referring and communicating with other health care professionals (when not separately reported) Documenting clinical information in the electronic or other health record Independently interpreting results (not separately reported) and communicating results to the patient/ family/caregiver Care coordination (not separately reported)  Note by: Eric DELENA Como, MD Date: 11/22/2023; Time: 5:26 AM

## 2023-11-22 ENCOUNTER — Encounter: Payer: Self-pay | Admitting: Pain Medicine

## 2023-11-22 ENCOUNTER — Ambulatory Visit: Payer: 59 | Attending: Pain Medicine | Admitting: Pain Medicine

## 2023-11-22 VITALS — BP 140/93 | HR 95 | Temp 97.3°F | Resp 16 | Ht 66.0 in | Wt 232.0 lb

## 2023-11-22 DIAGNOSIS — G8929 Other chronic pain: Secondary | ICD-10-CM | POA: Diagnosis present

## 2023-11-22 DIAGNOSIS — R937 Abnormal findings on diagnostic imaging of other parts of musculoskeletal system: Secondary | ICD-10-CM | POA: Diagnosis present

## 2023-11-22 DIAGNOSIS — M5137 Other intervertebral disc degeneration, lumbosacral region with discogenic back pain only: Secondary | ICD-10-CM | POA: Insufficient documentation

## 2023-11-22 DIAGNOSIS — M545 Low back pain, unspecified: Secondary | ICD-10-CM | POA: Diagnosis present

## 2023-11-22 NOTE — Patient Instructions (Signed)
  ____________________________________________________________________________________________  Spondylolisthesis  Spondylolisthesis is a condition that occurs when a vertebra in the spine slips out of place, usually in the lower back. Symptoms can vary from mild to severe, and a person may have no symptoms.  Some common symptoms include:  Back pain, especially chronic pain  Pain that radiates down the legs  Pain that worsens with exercise  Tightness in the hamstrings  Neck stiffness  Loss of spine flexibility  Weakness in the legs or trouble walking  Numbness and tingling in the groin and/or buttocks   Some causes of spondylolisthesis include: Birth defects. Sudden injury. Abnormal wear on the cartilage and bones, such as arthritis. Bone disease and fractures. Certain sports activities, such as gymnastics, weightlifting, and football.  A doctor can diagnose spondylolisthesis with a physical exam, X-rays, and possibly a CT scan.    Forward slippage is known as "Anterolisthesis".  Backward slippage is known as "Retrolisthesis".   Pathophysiology of Spondylolisthesis:   Grading Classification of Spondylolisthesis Grade I spondylolisthesis is 1 to 25% slippage, grade II is up to 50% slippage, grade III is up to 75% slippage, and grade IV is 76-100% slippage. If there is more than 100% slippage, it is known as spondyloptosis or grade V spondylolisthesis.       ____________________________________________________________________________________________    ____________________________________________________________________________________________  Understanding MRI Findings           Facet Joint Pain Patterns      Nerve Root Pain Patterns (Dermatomes)    ____________________________________________________________________________________________

## 2023-11-22 NOTE — Progress Notes (Signed)
 Safety precautions to be maintained throughout the outpatient stay will include: orient to surroundings, keep bed in low position, maintain call bell within reach at all times, provide assistance with transfer out of bed and ambulation.

## 2023-11-24 ENCOUNTER — Ambulatory Visit (INDEPENDENT_AMBULATORY_CARE_PROVIDER_SITE_OTHER): Payer: 59 | Admitting: Physician Assistant

## 2023-11-24 ENCOUNTER — Telehealth: Payer: Self-pay | Admitting: Physician Assistant

## 2023-11-24 ENCOUNTER — Encounter: Payer: Self-pay | Admitting: Physician Assistant

## 2023-11-24 VITALS — BP 126/86 | HR 97 | Temp 97.8°F | Ht 66.0 in | Wt 249.0 lb

## 2023-11-24 DIAGNOSIS — I2510 Atherosclerotic heart disease of native coronary artery without angina pectoris: Secondary | ICD-10-CM

## 2023-11-24 DIAGNOSIS — E66813 Obesity, class 3: Secondary | ICD-10-CM

## 2023-11-24 DIAGNOSIS — Z6841 Body Mass Index (BMI) 40.0 and over, adult: Secondary | ICD-10-CM | POA: Diagnosis not present

## 2023-11-24 DIAGNOSIS — I77811 Abdominal aortic ectasia: Secondary | ICD-10-CM | POA: Diagnosis not present

## 2023-11-24 DIAGNOSIS — Z66 Do not resuscitate: Secondary | ICD-10-CM | POA: Insufficient documentation

## 2023-11-24 DIAGNOSIS — I700 Atherosclerosis of aorta: Secondary | ICD-10-CM | POA: Diagnosis not present

## 2023-11-24 MED ORDER — WEGOVY 0.25 MG/0.5ML ~~LOC~~ SOAJ: 0.2500 mg | SUBCUTANEOUS | Status: DC

## 2023-11-24 NOTE — Progress Notes (Signed)
 Date:  11/24/2023   Name:  Kelsey Rose   DOB:  August 25, 1961   MRN:  [id number]   Chief Complaint: Hypertension  HPI Kelsey Rose presents today for follow-up on chronic conditions, namely HTN but also HLD and obesity.  She has recently been stressed about an incidental finding on lumbar MRI reported as enlarged abdominal aorta at 2.8 cm.  I have reassured the patient that this does not even meet criteria for aneurysm by most standards, and is not something that she needs to worry about at this time.  Per patient report, pain management can no longer help with her chronic pain as she failed to improve with 2 steroid injections.  Unfortunately she is up 17 pounds since her last visit with me 3 months ago.  Of note, she has mostly completed paperwork for living will and DNR, mainly prompted by the finding of her "aneurysm" which scared her.   Medication list has been reviewed and updated.  Current Meds  Medication Sig   albuterol  (ACCUNEB ) 1.25 MG/3ML nebulizer solution Take 1 ampule by nebulization every 6 (six) hours as needed for wheezing.   albuterol  (PROVENTIL  HFA;VENTOLIN  HFA) 108 (90 Base) MCG/ACT inhaler Inhale 2 puffs into the lungs every 6 (six) hours as needed for wheezing or shortness of breath.   alendronate  (FOSAMAX ) 70 MG tablet Take 1 tablet (70 mg total) by mouth every 7 (seven) days. Take with a full glass of water on an empty stomach.   atorvastatin  (LIPITOR) 20 MG tablet TAKE 1 TABLET(20 MG) BY MOUTH DAILY   citalopram  (CELEXA ) 40 MG tablet Take 1 tablet (40 mg total) by mouth daily.   lidocaine  (LIDODERM ) 5 % Place 1 patch onto the skin daily. Remove & Discard patch within 12 hours or as directed by MD   mirtazapine  (REMERON ) 7.5 MG tablet Take 7.5 mg by mouth as needed.   nystatin  powder Apply 1 Application topically 3 (three) times daily. PRN   Semaglutide -Weight Management (WEGOVY ) 0.25 MG/0.5ML SOAJ Inject 0.25 mg into the skin once a week.   Tiotropium  Bromide-Olodaterol (STIOLTO RESPIMAT) 2.5-2.5 MCG/ACT AERS Inhale 2 each into the lungs daily.     Review of Systems  Patient Active Problem List   Diagnosis Date Noted   DNR (do not resuscitate) 11/24/2023   Abdominal aortic ectasia (HCC) 11/24/2023   Lumbar facet joint syndrome 08/17/2023   Coronary atherosclerosis of native coronary artery 08/16/2023   Spondylosis without myelopathy or radiculopathy, lumbosacral region 06/28/2023   T12 wedge compression fracture, sequela 06/09/2023   DDD (degenerative disc disease), cervical 06/09/2023   COPD with chronic bronchitis (HCC) 06/09/2023   Osteoarthritis of hips (Bilateral) 06/09/2023   Cervical foraminal stenosis (Right: CC4-5, C5-6, C6-7, C7-T1) 06/09/2023   Cervical facet hypertrophy 06/09/2023   Cervical facet arthropathy 06/09/2023   Cervical facet joint pain 06/09/2023   Osteophytosis of shoulder (Left) 06/09/2023   Osteoarthritis of AC (acromioclavicular) joint (Left) 06/09/2023   Lumbar facet arthropathy 06/09/2023   Lumbar facet joint pain 06/09/2023   Osteopenia determined by x-ray 06/09/2023   Osteopenia of hips (Bilateral) 06/09/2023   Ligamentum flavum hypertrophy (L3-4, L4-5, L5-S1) 06/09/2023   Multilevel degenerative disc disease 06/09/2023   Lumbar intervertebral disc protrusion (Left: L3-4, L4-5) 06/09/2023   Lumbar foraminal stenosis (Left: L3-4, L5-S1) 06/09/2023   Chronic hip pain (4th area of Pain) (Bilateral) (L>R) 05/24/2023   Chronic shoulder pain (2ry area of Pain) (Bilateral) (L>R) 05/24/2023   Osteoarthritis involving multiple joints 05/24/2023   Chronic  neck pain (3ry area of Pain) (Midline) 05/24/2023   Chronic pain syndrome 05/23/2023   Aortic atherosclerosis (HCC) 03/25/2023   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 03/25/2023   Osteoporosis 03/25/2023   Generalized anxiety disorder 02/19/2023   Insomnia due to other mental disorder 02/19/2023   Benign paroxysmal positional vertigo 04/28/2022   Chronic  low back pain (1ry area of Pain) (Bilateral) (L>R) w/o sciatica 04/28/2022   Moderate recurrent major depression (HCC) 04/28/2022   History of vitamin D  deficiency 04/28/2022   Chronic diarrhea 02/16/2022   Class 3 severe obesity due to excess calories with serious comorbidity and body mass index (BMI) of 40.0 to 44.9 in adult (HCC) 08/22/2015   DDD (degenerative disc disease), lumbosacral 08/22/2015   GERD (gastroesophageal reflux disease) 04/25/2015   Hyperlipemia 10/30/2014   Chronic obstructive pulmonary disease (HCC) 03/29/2014   Tobacco use disorder, continuous 03/29/2014   Asthma 06/05/2013   Pain in shoulder region after shoulder replacement (Right) 11/18/2012    Allergies  Allergen Reactions   Sulfa Antibiotics Swelling    Developed swelling in eyes when using Sulfa eye drops.   Iodinated Contrast Media Nausea Only   Penicillins Nausea And Vomiting    Got really sick, throwing up as child    Immunization History  Administered Date(s) Administered   Influenza-Unspecified 10/22/2015   Pneumococcal Polysaccharide-23 08/30/2014   Tdap 04/14/2023    Past Surgical History:  Procedure Laterality Date   ABDOMINAL HYSTERECTOMY  63 yrs old   BIOPSY  09/16/2023   Procedure: BIOPSY;  Surgeon: Marnee Sink, MD;  Location: [facility];  Service: Endoscopy;;   COLONOSCOPY WITH PROPOFOL  N/A 09/16/2023   Procedure: COLONOSCOPY WITH PROPOFOL ;  Surgeon: Marnee Sink, MD;  Location: [facility];  Service: Endoscopy;  Laterality: N/A;   JOINT REPLACEMENT Right  May 2015   Shoulder replacement   TONSILLECTOMY AND ADENOIDECTOMY  Age 72    Social History   Tobacco Use   Smoking status: Every Day    Current packs/day: 1.00    Average packs/day: 1 pack/day for 53.0 years (53.0 ttl pk-yrs)    Types: Cigarettes   Tobacco comments:    Smoking 0.25 ppd as of 04/14/23  Vaping Use   Vaping status: Never Used  Substance Use Topics   Alcohol use: No   Drug use: Not Currently     Family History  Problem Relation Age of Onset   Cirrhosis Father    Depression Sister    COPD Sister    Diabetes Daughter    Depression Sister    COPD Sister    Gallstones Mother    Dementia Mother    Osteoarthritis Mother    Heart attack Brother         11/24/2023    2:24 PM 08/16/2023    3:49 PM 06/14/2023    1:29 PM 04/14/2023    1:29 PM  GAD 7 : Generalized Anxiety Score  Nervous, Anxious, on Edge 1 3 0 0  Control/stop worrying 1 3 2 2   Worry too much - different things 1 3 2 2   Trouble relaxing 0 3 2 2   Restless 3 3 1 1   Easily annoyed or irritable 3 3 1 3   Afraid - awful might happen 0 0 1 0  Total GAD 7 Score 9 18 9 10   Anxiety Difficulty Somewhat difficult Extremely difficult Somewhat difficult Somewhat difficult       11/24/2023    2:24 PM 11/22/2023    2:58 PM 10/14/2023    1:46  PM  Depression screen PHQ 2/9  Decreased Interest 3 1 1   Down, Depressed, Hopeless 3 0 0  PHQ - 2 Score 6 1 1   Altered sleeping 3    Tired, decreased energy 3    Change in appetite 3    Feeling bad or failure about yourself  0    Trouble concentrating 3    Moving slowly or fidgety/restless 0    Suicidal thoughts 0    PHQ-9 Score 18    Difficult doing work/chores Not difficult at all      BP Readings from Last 3 Encounters:  11/24/23 126/86  11/22/23 (!) 140/93  10/14/23 137/74    Wt Readings from Last 3 Encounters:  11/24/23 249 lb (112.9 kg)  11/22/23 232 lb (105.2 kg)  10/14/23 232 lb (105.2 kg)    BP 126/86   Pulse 97   Temp 97.8 F (36.6 C)   Ht 5\' 6"  (1.676 m)   Wt 249 lb (112.9 kg)   SpO2 96%   BMI 40.19 kg/m   Physical Exam Vitals and nursing note reviewed.  Constitutional:      Appearance: Normal appearance. She is obese.  Cardiovascular:     Rate and Rhythm: Normal rate.  Pulmonary:     Effort: Pulmonary effort is normal.  Abdominal:     General: There is no distension.  Musculoskeletal:        General: Normal range of motion.  Skin:     General: Skin is warm and dry.  Neurological:     Mental Status: She is alert and oriented to person, place, and time.     Gait: Gait is intact.  Psychiatric:        Mood and Affect: Mood and affect normal.     Recent Labs     Component Value Date/Time   NA 144 05/24/2023 1425   K 4.4 05/24/2023 1425   CL 105 05/24/2023 1425   CO2 24 01/19/2023 1608   GLUCOSE 112 (H) 05/24/2023 1425   BUN 6 (L) 05/24/2023 1425   CREATININE 0.77 05/24/2023 1425   CALCIUM  9.5 05/24/2023 1425   PROT 7.0 05/24/2023 1425   ALBUMIN 4.0 05/24/2023 1425   AST 14 05/24/2023 1425   ALT 13 01/19/2023 1608   ALKPHOS 121 05/24/2023 1425   BILITOT <0.2 05/24/2023 1425    Lab Results  Component Value Date   WBC 7.2 01/19/2023   HGB 15.3 01/19/2023   HCT 44.5 01/19/2023   MCV 91 01/19/2023   PLT 234 01/19/2023   Lab Results  Component Value Date   HGBA1C 5.5 12/20/2014   Lab Results  Component Value Date   CHOL 168 08/16/2023   HDL 49 08/16/2023   LDLCALC 88 08/16/2023   TRIG 179 (H) 08/16/2023   CHOLHDL 3.4 08/16/2023   Lab Results  Component Value Date   TSH 1.810 01/19/2023     Assessment and Plan:  1. Class 3 severe obesity due to excess calories with serious comorbidity and body mass index (BMI) of 40.0 to 44.9 in adult [facility]) (Primary) Check routine labs as below.  Given sample of Wegovy , we will try to get prior Auth through Medicare on account of comorbid cardiovascular disease as documented below.  - Comprehensive metabolic panel - Hemoglobin A1c - Lipid panel - Semaglutide -Weight Management (WEGOVY ) 0.25 MG/0.5ML SOAJ; Inject 0.25 mg into the skin once a week.  Dispense: 2 mL  2. Atherosclerosis of native coronary artery of native heart without angina pectoris  Continue statin therapy.  Encourage smoking cessation. - Semaglutide -Weight Management (WEGOVY ) 0.25 MG/0.5ML SOAJ; Inject 0.25 mg into the skin once a week.  Dispense: 2 mL  3. Aortic atherosclerosis (HCC) Continue  statin therapy.  Encourage smoking cessation. - Semaglutide -Weight Management (WEGOVY ) 0.25 MG/0.5ML SOAJ; Inject 0.25 mg into the skin once a week.  Dispense: 2 mL  4. Abdominal aortic ectasia (HCC) Patient reassured that while the abdominal aorta is ectatic, it does not meet criteria for aneurysm and is not immediately concerning.  She should continue her statin therapy as prescribed, and reduce risk through smoking cessation and blood pressure control  5. DNR (do not resuscitate) Had a conversation with patient about this today.  She understands the meaning of a DNR and has had time to consider her decision.  She does not wish to be resuscitated in any way, does not wish to receive blood transfusions.   Follow-up as scheduled in February   Cody Das, PA-C, DMSc, Nutritionist [facility] Primary Care and Sports Medicine [facility] [phone]

## 2023-11-24 NOTE — Telephone Encounter (Signed)
 Please initiate PA for Wegovy . Medicare will not cover it for weight loss so it is important to include cardiovascular disease in the PA. She has coronary artery disease and aortic atherosclerosis. Other comorbidities include HLD, osteoarthritis, COPD, depression

## 2023-11-25 ENCOUNTER — Telehealth: Payer: Self-pay

## 2023-11-25 LAB — LIPID PANEL
Chol/HDL Ratio: 4 {ratio} (ref 0.0–4.4)
Cholesterol, Total: 198 mg/dL (ref 100–199)
HDL: 50 mg/dL (ref >39)
LDL Chol Calc (NIH): 107 mg/dL — ABNORMAL HIGH (ref 0–99)
Triglycerides: 236 mg/dL — ABNORMAL HIGH (ref 0–149)
VLDL Cholesterol Cal: 41 mg/dL — ABNORMAL HIGH (ref 5–40)

## 2023-11-25 LAB — COMPREHENSIVE METABOLIC PANEL
ALT: 12 [IU]/L (ref 0–32)
AST: 14 [IU]/L (ref 0–40)
Albumin: 3.8 g/dL — ABNORMAL LOW (ref 3.9–4.9)
Alkaline Phosphatase: 102 [IU]/L (ref 44–121)
BUN/Creatinine Ratio: 9 — ABNORMAL LOW (ref 12–28)
BUN: 7 mg/dL — ABNORMAL LOW (ref 8–27)
Bilirubin Total: 0.3 mg/dL (ref 0.0–1.2)
CO2: 23 mmol/L (ref 20–29)
Calcium: 9.2 mg/dL (ref 8.7–10.3)
Chloride: 101 mmol/L (ref 96–106)
Creatinine, Ser: 0.75 mg/dL (ref 0.57–1.00)
Globulin, Total: 3 g/dL (ref 1.5–4.5)
Glucose: 91 mg/dL (ref 70–99)
Potassium: 4.6 mmol/L (ref 3.5–5.2)
Sodium: 141 mmol/L (ref 134–144)
Total Protein: 6.8 g/dL (ref 6.0–8.5)
eGFR: 91 mL/min/{1.73_m2} (ref >59)

## 2023-11-25 LAB — HEMOGLOBIN A1C
Est. average glucose Bld gHb Est-mCnc: 117 mg/dL
Hgb A1c MFr Bld: 5.7 % — ABNORMAL HIGH (ref 4.8–5.6)

## 2023-11-25 NOTE — Telephone Encounter (Signed)
Denied  Decision Notes: Your requested medication is an anti-obesity medication that is excluded from Part D prescription coverage under Medicare rules, unless it is being used for a medically-accepted diagnosis approved by the Food and Drug Administration. Please refer to your Evidence of Coverage (EOC) section that references Part D drug coverage in your pharmacy plan documents for more information. Reviewed by: Maurice March *Please note: WUJWJX may be covered when used for a medically-accepted indication. The Food and Drug Administration has approved [phone] for the treatment to reduce the risk of major adverse cardiovascular events (cardiovascular death, non-fatal myocardial infarction, or non-fatal stroke). Any request outside this indication is an exclusion. If the treating physician would like to discuss this coverage decision with the physician or health care professional reviewer, please call [employer] Prior Authorization department at [phone].

## 2023-11-25 NOTE — Telephone Encounter (Signed)
PA completed waiting on insurance approval.  Key: [id number]  KP

## 2023-11-25 NOTE — Telephone Encounter (Signed)
PA completed  KP 

## 2023-12-13 NOTE — Telephone Encounter (Signed)
 Please advise. Thank you. JM

## 2023-12-23 ENCOUNTER — Ambulatory Visit: Payer: 59 | Admitting: Physician Assistant

## 2023-12-27 ENCOUNTER — Ambulatory Visit: Payer: 59 | Admitting: Physician Assistant

## 2023-12-29 ENCOUNTER — Other Ambulatory Visit: Payer: Self-pay | Admitting: Medical Genetics

## 2024-01-17 ENCOUNTER — Ambulatory Visit: Payer: 59 | Admitting: Physician Assistant

## 2024-01-20 ENCOUNTER — Other Ambulatory Visit: Payer: Self-pay | Admitting: Physician Assistant

## 2024-01-20 DIAGNOSIS — E782 Mixed hyperlipidemia: Secondary | ICD-10-CM

## 2024-01-21 NOTE — Telephone Encounter (Signed)
 Requested Prescriptions  Pending Prescriptions Disp Refills   atorvastatin (LIPITOR) 20 MG tablet [Pharmacy Med Name: ATORVASTATIN 20MG  TABLETS] 90 tablet 0    Sig: TAKE 1 TABLET(20 MG) BY MOUTH DAILY     Cardiovascular:  Antilipid - Statins Failed - 01/21/2024 12:24 PM      Failed - Lipid Panel in normal range within the last 12 months    Cholesterol, Total  Date Value Ref Range Status  11/24/2023 198 100 - 199 mg/dL Final   LDL Chol Calc (NIH)  Date Value Ref Range Status  11/24/2023 107 (H) 0 - 99 mg/dL Final   HDL  Date Value Ref Range Status  11/24/2023 50 >39 mg/dL Final   Triglycerides  Date Value Ref Range Status  11/24/2023 236 (H) 0 - 149 mg/dL Final         Passed - Patient is not pregnant      Passed - Valid encounter within last 12 months    Recent Outpatient Visits           1 month ago Class 3 severe obesity due to excess calories with serious comorbidity and body mass index (BMI) of 40.0 to 44.9 in adult [facility])   [place] [facility] at [facility], Melton Alar, [place]   5 months ago Mixed hyperlipidemia   [facility] at [facility], Melton Alar, [place]   7 months ago Moderate recurrent major depression [facility])   [facility] at [facility], Melton Alar, [place]   9 months ago Moderate recurrent major depression [facility])   [facility] at [facility], Melton Alar, [place]   10 months ago Insomnia due to other mental disorder   [facility] at [facility], Melton Alar, [place]       Future Appointments             In 3 weeks Mordecai Maes, Melton Alar, PA [facility] at [facility], [facility]

## 2024-02-11 ENCOUNTER — Encounter: Payer: Self-pay | Admitting: Physician Assistant

## 2024-02-11 ENCOUNTER — Ambulatory Visit: Admitting: Physician Assistant

## 2024-02-11 VITALS — BP 124/80 | HR 85 | Temp 98.4°F | Ht 66.0 in | Wt 237.0 lb

## 2024-02-11 DIAGNOSIS — J449 Chronic obstructive pulmonary disease, unspecified: Secondary | ICD-10-CM | POA: Diagnosis not present

## 2024-02-11 DIAGNOSIS — F17209 Nicotine dependence, unspecified, with unspecified nicotine-induced disorders: Secondary | ICD-10-CM | POA: Diagnosis not present

## 2024-02-11 DIAGNOSIS — L304 Erythema intertrigo: Secondary | ICD-10-CM | POA: Diagnosis not present

## 2024-02-11 MED ORDER — FLUCONAZOLE 150 MG PO TABS
150.0000 mg | ORAL_TABLET | Freq: Once | ORAL | 0 refills | Status: AC
Start: 2024-02-11 — End: 2024-02-11

## 2024-02-11 MED ORDER — BUPROPION HCL ER (SR) 150 MG PO TB12: 150.0000 mg | ORAL_TABLET | Freq: Every day | ORAL | 1 refills | Status: DC

## 2024-02-11 MED ORDER — LIDOCAINE 5 % EX PTCH: 1.0000 | MEDICATED_PATCH | Freq: Every day | CUTANEOUS | 2 refills | Status: DC | PRN

## 2024-02-11 MED ORDER — ALBUTEROL SULFATE HFA 108 (90 BASE) MCG/ACT IN AERS: 2.0000 | INHALATION_SPRAY | Freq: Four times a day (QID) | RESPIRATORY_TRACT | 3 refills | Status: DC | PRN

## 2024-02-11 MED ORDER — NYSTATIN 100000 UNIT/GM EX POWD: 1.0000 | Freq: Three times a day (TID) | CUTANEOUS | 5 refills | Status: DC | PRN

## 2024-02-11 MED ORDER — NYSTATIN 100000 UNIT/GM EX CREA: 1.0000 | TOPICAL_CREAM | Freq: Two times a day (BID) | CUTANEOUS | 2 refills | Status: DC

## 2024-02-11 NOTE — Progress Notes (Signed)
 Date:  02/11/2024   Name:  Kelsey Rose   DOB:  [birth year]   MRN:  [id number]   Chief Complaint: Hypertension and Rash (Has a smell )  Rash This is a new problem. Episode onset: X2 days. The problem has been gradually worsening since onset. Location: right thigh, groin area. The rash is characterized by redness, burning, pain and blistering. Past treatments include nothing.   Kelsey Rose presents today for medication refill and evaluation of a burning "raw" rash of the right inguinal region.  She has previously struggled with inframammary intertrigo for which she typically uses nystatin powder, but has run out of this.  In an effort to treat the rash, she has been bathing twice per day.  States the rash has an odor.  She has prediabetes with last A1c 5.7 on 11/24/2023.  Says she wants to restart bupropion.  Has done quite well in smoking reduction, now smokes about 2 cigarettes/day, primarily owing to the recent move (cannot smoke inside her new place).   Medication list has been reviewed and updated.  Current Meds  Medication Sig   albuterol (ACCUNEB) 1.25 MG/3ML nebulizer solution Take 1 ampule by nebulization every 6 (six) hours as needed for wheezing.   albuterol (VENTOLIN HFA) 108 (90 Base) MCG/ACT inhaler Inhale 2 puffs into the lungs every 6 (six) hours as needed for wheezing or shortness of breath.   alendronate (FOSAMAX) 70 MG tablet Take 1 tablet (70 mg total) by mouth every 7 (seven) days. Take with a full glass of water on an empty stomach.   atorvastatin (LIPITOR) 20 MG tablet TAKE 1 TABLET(20 MG) BY MOUTH DAILY   buPROPion (WELLBUTRIN SR) 150 MG 12 hr tablet Take 1 tablet (150 mg total) by mouth daily.   citalopram (CELEXA) 40 MG tablet Take 1 tablet (40 mg total) by mouth daily.   [EXPIRED] fluconazole (DIFLUCAN) 150 MG tablet Take 1 tablet (150 mg total) by mouth once for 1 dose.   mirtazapine (REMERON) 7.5 MG tablet Take 7.5 mg by mouth as needed.   nystatin cream  (MYCOSTATIN) Apply 1 Application topically 2 (two) times daily.   Tiotropium Bromide-Olodaterol (STIOLTO RESPIMAT) 2.5-2.5 MCG/ACT AERS Inhale 2 each into the lungs daily.   [DISCONTINUED] albuterol (PROVENTIL HFA;VENTOLIN HFA) 108 (90 Base) MCG/ACT inhaler Inhale 2 puffs into the lungs every 6 (six) hours as needed for wheezing or shortness of breath.   [DISCONTINUED] lidocaine (LIDODERM) 5 % Place 1 patch onto the skin daily. Remove & Discard patch within 12 hours or as directed by MD   [DISCONTINUED] nystatin powder Apply 1 Application topically 3 (three) times daily. PRN     Review of Systems  Skin:  Positive for rash.    Patient Active Problem List   Diagnosis Date Noted   Intertrigo 02/12/2024   DNR (do not resuscitate) 11/24/2023   Abdominal aortic ectasia (HCC) 11/24/2023   Lumbar facet joint syndrome 08/17/2023   Coronary atherosclerosis of native coronary artery 08/16/2023   Spondylosis without myelopathy or radiculopathy, lumbosacral region 06/28/2023   T12 wedge compression fracture, sequela 06/09/2023   DDD (degenerative disc disease), cervical 06/09/2023   COPD with chronic bronchitis (HCC) 06/09/2023   Osteoarthritis of hips (Bilateral) 06/09/2023   Cervical foraminal stenosis (Right: CC4-5, C5-6, C6-7, C7-T1) 06/09/2023   Cervical facet hypertrophy 06/09/2023   Cervical facet arthropathy 06/09/2023   Cervical facet joint pain 06/09/2023   Osteophytosis of shoulder (Left) 06/09/2023   Osteoarthritis of AC (acromioclavicular) joint (Left) 06/09/2023  Lumbar facet arthropathy 06/09/2023   Lumbar facet joint pain 06/09/2023   Osteopenia determined by x-ray 06/09/2023   Osteopenia of hips (Bilateral) 06/09/2023   Ligamentum flavum hypertrophy (L3-4, L4-5, L5-S1) 06/09/2023   Multilevel degenerative disc disease 06/09/2023   Lumbar intervertebral disc protrusion (Left: L3-4, L4-5) 06/09/2023   Lumbar foraminal stenosis (Left: L3-4, L5-S1) 06/09/2023   Chronic hip pain  (4th area of Pain) (Bilateral) (L>R) 05/24/2023   Chronic shoulder pain (2ry area of Pain) (Bilateral) (L>R) 05/24/2023   Osteoarthritis involving multiple joints 05/24/2023   Chronic neck pain (3ry area of Pain) (Midline) 05/24/2023   Chronic pain syndrome 05/23/2023   Aortic atherosclerosis (HCC) 03/25/2023   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 03/25/2023   Osteoporosis 03/25/2023   Generalized anxiety disorder 02/19/2023   Insomnia due to other mental disorder 02/19/2023   Benign paroxysmal positional vertigo 04/28/2022   Chronic low back pain (1ry area of Pain) (Bilateral) (L>R) w/o sciatica 04/28/2022   Moderate recurrent major depression (HCC) 04/28/2022   History of vitamin D deficiency 04/28/2022   Chronic diarrhea 02/16/2022   Class 3 severe obesity due to excess calories with serious comorbidity and body mass index (BMI) of 40.0 to 44.9 in adult (HCC) 08/22/2015   DDD (degenerative disc disease), lumbosacral 08/22/2015   GERD (gastroesophageal reflux disease) 04/25/2015   Hyperlipemia 10/30/2014   Chronic obstructive pulmonary disease (HCC) 03/29/2014   Tobacco use disorder, continuous 03/29/2014   Asthma 06/05/2013   Pain in shoulder region after shoulder replacement (Right) 11/18/2012    Allergies  Allergen Reactions   Sulfa Antibiotics Swelling    Developed swelling in eyes when using Sulfa eye drops.   Iodinated Contrast Media Nausea Only   Penicillins Nausea And Vomiting    Got really sick, throwing up as child    Immunization History  Administered Date(s) Administered   Influenza-Unspecified 10/22/2015   Pneumococcal Polysaccharide-23 08/30/2014   Tdap 04/14/2023    Past Surgical History:  Procedure Laterality Date   ABDOMINAL HYSTERECTOMY  63 yrs old   BIOPSY  09/16/2023   Procedure: BIOPSY;  Surgeon: Midge Minium, MD;  Location: [facility] ENDOSCOPY;  Service: Endoscopy;;   COLONOSCOPY WITH PROPOFOL N/A 09/16/2023   Procedure: COLONOSCOPY WITH PROPOFOL;  Surgeon:  Midge Minium, MD;  Location: [facility] ENDOSCOPY;  Service: Endoscopy;  Laterality: N/A;   JOINT REPLACEMENT Right  May 2015   Shoulder replacement   TONSILLECTOMY AND ADENOIDECTOMY  Age 2    Social History   Tobacco Use   Smoking status: Every Day    Current packs/day: 0.10    Average packs/day: 1 pack/day for 45.3 years (44.6 ttl pk-yrs)    Types: Cigarettes    Start date: 55  Vaping Use   Vaping status: Never Used  Substance Use Topics   Alcohol use: No   Drug use: Not Currently    Family History  Problem Relation Age of Onset   Cirrhosis Father    Depression Sister    COPD Sister    Diabetes Daughter    Depression Sister    COPD Sister    Gallstones Mother    Dementia Mother    Osteoarthritis Mother    Heart attack Brother         02/11/2024    1:30 PM 11/24/2023    2:24 PM 08/16/2023    3:49 PM 06/14/2023    1:29 PM  GAD 7 : Generalized Anxiety Score  Nervous, Anxious, on Edge 1 1 3  0  Control/stop worrying 1 1 3  2  Worry too much - different things 2 1 3 2   Trouble relaxing 2 0 3 2  Restless 2 3 3 1   Easily annoyed or irritable 2 3 3 1   Afraid - awful might happen 0 0 0 1  Total GAD 7 Score 10 9 18 9   Anxiety Difficulty Somewhat difficult Somewhat difficult Extremely difficult Somewhat difficult       02/11/2024    1:30 PM 11/24/2023    2:24 PM 11/22/2023    2:58 PM  Depression screen PHQ 2/9  Decreased Interest 2 3 1   Down, Depressed, Hopeless 2 3 0  PHQ - 2 Score 4 6 1   Altered sleeping 1 3   Tired, decreased energy 3 3   Change in appetite 3 3   Feeling bad or failure about yourself  0 0   Trouble concentrating 1 3   Moving slowly or fidgety/restless 2 0   Suicidal thoughts 0 0   PHQ-9 Score 14 18   Difficult doing work/chores Somewhat difficult Not difficult at all     BP Readings from Last 3 Encounters:  02/11/24 124/80  11/24/23 126/86  11/22/23 (!) 140/93    Wt Readings from Last 3 Encounters:  02/11/24 237 lb (107.5 kg)  11/24/23 249  lb (112.9 kg)  11/22/23 232 lb (105.2 kg)    BP 124/80 (BP Location: Left Arm, Cuff Size: Large)   Pulse 85   Temp 98.4 F (36.9 C)   Ht 5\' 6"  (1.676 m)   Wt 237 lb (107.5 kg)   SpO2 96%   BMI 38.25 kg/m   Physical Exam Vitals and nursing note reviewed.  Constitutional:      Appearance: Normal appearance.  Cardiovascular:     Rate and Rhythm: Normal rate.  Pulmonary:     Effort: Pulmonary effort is normal.  Abdominal:     General: There is no distension.  Musculoskeletal:        General: Normal range of motion.  Skin:    General: Skin is warm and dry.     Findings: Rash present.     Comments: Large erythematous rash of the right inguinal region, slightly warm but not particularly tender to palpation.  Incidentally there is a relatively small skin tag in this area  Neurological:     Mental Status: She is alert and oriented to person, place, and time.     Gait: Gait is intact.  Psychiatric:        Mood and Affect: Mood and affect normal.     Recent Labs     Component Value Date/Time   NA 141 11/24/2023 1519   K 4.6 11/24/2023 1519   CL 101 11/24/2023 1519   CO2 23 11/24/2023 1519   GLUCOSE 91 11/24/2023 1519   BUN 7 (L) 11/24/2023 1519   CREATININE 0.75 11/24/2023 1519   CALCIUM 9.2 11/24/2023 1519   PROT 6.8 11/24/2023 1519   ALBUMIN 3.8 (L) 11/24/2023 1519   AST 14 11/24/2023 1519   ALT 12 11/24/2023 1519   ALKPHOS 102 11/24/2023 1519   BILITOT 0.3 11/24/2023 1519    Lab Results  Component Value Date   WBC 7.2 01/19/2023   HGB 15.3 01/19/2023   HCT 44.5 01/19/2023   MCV 91 01/19/2023   PLT 234 01/19/2023   Lab Results  Component Value Date   HGBA1C 5.7 (H) 11/24/2023   Lab Results  Component Value Date   CHOL 198 11/24/2023   HDL 50 11/24/2023  LDLCALC 107 (H) 11/24/2023   TRIG 236 (H) 11/24/2023   CHOLHDL 4.0 11/24/2023   Lab Results  Component Value Date   TSH 1.810 01/19/2023     Assessment and Plan:  Intertrigo Assessment &  Plan: Patient education provided.  I find this is likely fungal, and she should try to keep the area as dry as possible.  She should bathe only once per day, completely dry the area, then apply the nystatin cream +/- powder.  I will also give her single dose of oral fluconazole for additional coverage.  Orders: -     Nystatin; Apply 1 Application topically 2 (two) times daily.  Dispense: 30 g; Refill: 2 -     Fluconazole; Take 1 tablet (150 mg total) by mouth once for 1 dose.  Dispense: 1 tablet; Refill: 0 -     Nystatin; Apply 1 Application topically 3 (three) times daily as needed.  Dispense: 30 g; Refill: 5  Tobacco use disorder, continuous Assessment & Plan: Congratulated on substantial progress so far.  She has nearly quit.  Will restart bupropion as requested which may give her a mood boost and allow her to finally kick the habit.  Orders: -     buPROPion HCl ER (SR); Take 1 tablet (150 mg total) by mouth daily.  Dispense: 90 tablet; Refill: 1  Chronic obstructive pulmonary disease, unspecified COPD type (HCC) Assessment & Plan: Refill albuterol, encouraged smoking cessation.  Orders: -     Albuterol Sulfate HFA; Inhale 2 puffs into the lungs every 6 (six) hours as needed for wheezing or shortness of breath.  Dispense: 3 each; Refill: 3  Other orders -     Lidocaine; Place 1 patch onto the skin daily as needed. Remove & Discard patch within 12 hours or as directed by MD  Dispense: 15 patch; Refill: 2     Return in about 10 days (around 02/21/2024) for OV f/u rash.    Alvester Morin, PA-C, DMSc, Nutritionist [facility] [phone]

## 2024-02-12 ENCOUNTER — Encounter: Payer: Self-pay | Admitting: Physician Assistant

## 2024-02-12 DIAGNOSIS — L304 Erythema intertrigo: Secondary | ICD-10-CM | POA: Insufficient documentation

## 2024-02-12 NOTE — Assessment & Plan Note (Signed)
 Congratulated on substantial progress so far.  She has nearly quit.  Will restart bupropion as requested which may give her a mood boost and allow her to finally kick the habit.

## 2024-02-12 NOTE — Assessment & Plan Note (Signed)
 Refill albuterol, encouraged smoking cessation.

## 2024-02-12 NOTE — Assessment & Plan Note (Signed)
 Patient education provided.  I find this is likely fungal, and she should try to keep the area as dry as possible.  She should bathe only once per day, completely dry the area, then apply the nystatin cream +/- powder.  I will also give her single dose of oral fluconazole for additional coverage.

## 2024-02-23 ENCOUNTER — Ambulatory Visit: Admitting: Physician Assistant

## 2024-03-15 ENCOUNTER — Encounter: Payer: Self-pay | Admitting: Physician Assistant

## 2024-03-15 ENCOUNTER — Ambulatory Visit (INDEPENDENT_AMBULATORY_CARE_PROVIDER_SITE_OTHER): Admitting: Physician Assistant

## 2024-03-15 VITALS — BP 118/84 | HR 81 | Temp 97.9°F | Ht 66.0 in | Wt 236.0 lb

## 2024-03-15 DIAGNOSIS — L304 Erythema intertrigo: Secondary | ICD-10-CM

## 2024-03-15 DIAGNOSIS — F17209 Nicotine dependence, unspecified, with unspecified nicotine-induced disorders: Secondary | ICD-10-CM | POA: Diagnosis not present

## 2024-03-15 DIAGNOSIS — G8929 Other chronic pain: Secondary | ICD-10-CM

## 2024-03-15 DIAGNOSIS — M545 Low back pain, unspecified: Secondary | ICD-10-CM

## 2024-03-15 NOTE — Progress Notes (Signed)
 Date:  03/15/2024   Name:  Kelsey Rose   DOB:  October 15, 1961   MRN:  [id number]   Chief Complaint: Rash (Rash is better )  HPI Kelsey Rose returns overdue for follow-up on candidal rash last assessed 1 month ago and treated with oral fluconazole  as well as topical nystatin .  Patient reports that shortly after taking the fluconazole , the rash significantly improved, and with the use of nystatin  cream, it went away completely.  She has made excellent progress regarding tobacco use disorder, currently smoking 1 to 2 cigarettes/day, on Wellbutrin  to help with this.  She also mentions her chronic back pain today, has seen pain management Dr. Naveira for this problem with extensive imaging, last visit with him was January 2025.  She has had several corticosteroid injections without much relief.  She is adamant about avoiding opioid pain management.  Wonders if there are any other options for her.   Medication list has been reviewed and updated.  Current Meds  Medication Sig   albuterol  (ACCUNEB ) 1.25 MG/3ML nebulizer solution Take 1 ampule by nebulization every 6 (six) hours as needed for wheezing.   albuterol  (VENTOLIN  HFA) 108 (90 Base) MCG/ACT inhaler Inhale 2 puffs into the lungs every 6 (six) hours as needed for wheezing or shortness of breath.   alendronate  (FOSAMAX ) 70 MG tablet Take 1 tablet (70 mg total) by mouth every 7 (seven) days. Take with a full glass of water on an empty stomach.   atorvastatin  (LIPITOR) 20 MG tablet TAKE 1 TABLET(20 MG) BY MOUTH DAILY   buPROPion  (WELLBUTRIN  SR) 150 MG 12 hr tablet Take 1 tablet (150 mg total) by mouth daily.   citalopram  (CELEXA ) 40 MG tablet Take 1 tablet (40 mg total) by mouth daily.   lidocaine  (LIDODERM ) 5 % Place 1 patch onto the skin daily as needed. Remove & Discard patch within 12 hours or as directed by MD   mirtazapine  (REMERON ) 7.5 MG tablet Take 7.5 mg by mouth as needed.   Tiotropium Bromide-Olodaterol (STIOLTO RESPIMAT) 2.5-2.5  MCG/ACT AERS Inhale 2 each into the lungs daily.   [DISCONTINUED] nystatin  cream (MYCOSTATIN ) Apply 1 Application topically 2 (two) times daily.   [DISCONTINUED] nystatin  powder Apply 1 Application topically 3 (three) times daily as needed.     Review of Systems  Patient Active Problem List   Diagnosis Date Noted   Intertrigo 02/12/2024   DNR (do not resuscitate) 11/24/2023   Abdominal aortic ectasia (HCC) 11/24/2023   Lumbar facet joint syndrome 08/17/2023   Coronary atherosclerosis of native coronary artery 08/16/2023   Spondylosis without myelopathy or radiculopathy, lumbosacral region 06/28/2023   T12 wedge compression fracture, sequela 06/09/2023   DDD (degenerative disc disease), cervical 06/09/2023   COPD with chronic bronchitis (HCC) 06/09/2023   Osteoarthritis of hips (Bilateral) 06/09/2023   Cervical foraminal stenosis (Right: CC4-5, C5-6, C6-7, C7-T1) 06/09/2023   Cervical facet hypertrophy 06/09/2023   Cervical facet arthropathy 06/09/2023   Cervical facet joint pain 06/09/2023   Osteophytosis of shoulder (Left) 06/09/2023   Osteoarthritis of AC (acromioclavicular) joint (Left) 06/09/2023   Lumbar facet arthropathy 06/09/2023   Lumbar facet joint pain 06/09/2023   Osteopenia determined by x-ray 06/09/2023   Osteopenia of hips (Bilateral) 06/09/2023   Ligamentum flavum hypertrophy (L3-4, L4-5, L5-S1) 06/09/2023   Multilevel degenerative disc disease 06/09/2023   Lumbar intervertebral disc protrusion (Left: L3-4, L4-5) 06/09/2023   Lumbar foraminal stenosis (Left: L3-4, L5-S1) 06/09/2023   Chronic hip pain (4th area of Pain) (Bilateral) (L>R)  05/24/2023   Chronic shoulder pain (2ry area of Pain) (Bilateral) (L>R) 05/24/2023   Osteoarthritis involving multiple joints 05/24/2023   Chronic neck pain (3ry area of Pain) (Midline) 05/24/2023   Chronic pain syndrome 05/23/2023   Aortic atherosclerosis (HCC) 03/25/2023   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 03/25/2023    Osteoporosis 03/25/2023   Generalized anxiety disorder 02/19/2023   Insomnia due to other mental disorder 02/19/2023   Benign paroxysmal positional vertigo 04/28/2022   Chronic low back pain (1ry area of Pain) (Bilateral) (L>R) w/o sciatica 04/28/2022   Moderate recurrent major depression (HCC) 04/28/2022   History of vitamin D  deficiency 04/28/2022   Chronic diarrhea 02/16/2022   Class 3 severe obesity due to excess calories with serious comorbidity and body mass index (BMI) of 40.0 to 44.9 in adult 08/22/2015   DDD (degenerative disc disease), lumbosacral 08/22/2015   GERD (gastroesophageal reflux disease) 04/25/2015   Hyperlipemia 10/30/2014   Chronic obstructive pulmonary disease (HCC) 03/29/2014   Tobacco use disorder, continuous 03/29/2014   Asthma 06/05/2013   Pain in shoulder region after shoulder replacement (Right) 11/18/2012    Allergies  Allergen Reactions   Sulfa Antibiotics Swelling    Developed swelling in eyes when using Sulfa eye drops.   Iodinated Contrast Media Nausea Only   Penicillins Nausea And Vomiting    Got really sick, throwing up as child    Immunization History  Administered Date(s) Administered   Influenza-Unspecified 10/22/2015   Pneumococcal Polysaccharide-23 08/30/2014   Tdap 04/14/2023    Past Surgical History:  Procedure Laterality Date   ABDOMINAL HYSTERECTOMY  63 yrs old   BIOPSY  09/16/2023   Procedure: BIOPSY;  Surgeon: Marnee Sink, MD;  Location: [facility] ENDOSCOPY;  Service: Endoscopy;;   COLONOSCOPY WITH PROPOFOL  N/A 09/16/2023   Procedure: COLONOSCOPY WITH PROPOFOL ;  Surgeon: Marnee Sink, MD;  Location: [facility] ENDOSCOPY;  Service: Endoscopy;  Laterality: N/A;   JOINT REPLACEMENT Right  May 2015   Shoulder replacement   TONSILLECTOMY AND ADENOIDECTOMY  Age 75    Social History   Tobacco Use   Smoking status: Every Day    Current packs/day: 0.10    Average packs/day: 1 pack/day for 45.3 years (44.6 ttl pk-yrs)    Types: Cigarettes     Start date: 22  Vaping Use   Vaping status: Never Used  Substance Use Topics   Alcohol use: No   Drug use: Not Currently    Family History  Problem Relation Age of Onset   Cirrhosis Father    Depression Sister    COPD Sister    Diabetes Daughter    Depression Sister    COPD Sister    Gallstones Mother    Dementia Mother    Osteoarthritis Mother    Heart attack Brother         03/15/2024    1:39 PM 02/11/2024    1:30 PM 11/24/2023    2:24 PM 08/16/2023    3:49 PM  GAD 7 : Generalized Anxiety Score  Nervous, Anxious, on Edge 1 1 1 3   Control/stop worrying 1 1 1 3   Worry too much - different things 1 2 1 3   Trouble relaxing 1 2 0 3  Restless 1 2 3 3   Easily annoyed or irritable 1 2 3 3   Afraid - awful might happen 0 0 0 0  Total GAD 7 Score 6 10 9 18   Anxiety Difficulty Somewhat difficult Somewhat difficult Somewhat difficult Extremely difficult       03/15/2024  1:38 PM 02/11/2024    1:30 PM 11/24/2023    2:24 PM  Depression screen PHQ 2/9  Decreased Interest 2 2 3   Down, Depressed, Hopeless 3 2 3   PHQ - 2 Score 5 4 6   Altered sleeping 1 1 3   Tired, decreased energy 3 3 3   Change in appetite 3 3 3   Feeling bad or failure about yourself  0 0 0  Trouble concentrating 3 1 3   Moving slowly or fidgety/restless 2 2 0  Suicidal thoughts 0 0 0  PHQ-9 Score 17 14 18   Difficult doing work/chores Extremely dIfficult Somewhat difficult Not difficult at all    BP Readings from Last 3 Encounters:  03/15/24 118/84  02/11/24 124/80  11/24/23 126/86    Wt Readings from Last 3 Encounters:  03/15/24 236 lb (107 kg)  02/11/24 237 lb (107.5 kg)  11/24/23 249 lb (112.9 kg)    BP 118/84   Pulse 81   Temp 97.9 F (36.6 C)   Ht 5\' 6"  (1.676 m)   Wt 236 lb (107 kg)   SpO2 96%   BMI 38.09 kg/m   Physical Exam Vitals and nursing note reviewed.  Constitutional:      Appearance: Normal appearance.     Comments: Uses cane  Cardiovascular:     Rate and Rhythm:  Normal rate.  Pulmonary:     Effort: Pulmonary effort is normal.  Abdominal:     General: There is no distension.  Musculoskeletal:        General: Normal range of motion.  Skin:    General: Skin is warm and dry.  Neurological:     Mental Status: She is alert and oriented to person, place, and time.     Gait: Gait is intact.  Psychiatric:        Mood and Affect: Mood and affect normal.     Recent Labs     Component Value Date/Time   NA 141 11/24/2023 1519   K 4.6 11/24/2023 1519   CL 101 11/24/2023 1519   CO2 23 11/24/2023 1519   GLUCOSE 91 11/24/2023 1519   BUN 7 (L) 11/24/2023 1519   CREATININE 0.75 11/24/2023 1519   CALCIUM  9.2 11/24/2023 1519   PROT 6.8 11/24/2023 1519   ALBUMIN 3.8 (L) 11/24/2023 1519   AST 14 11/24/2023 1519   ALT 12 11/24/2023 1519   ALKPHOS 102 11/24/2023 1519   BILITOT 0.3 11/24/2023 1519    Lab Results  Component Value Date   WBC 7.2 01/19/2023   HGB 15.3 01/19/2023   HCT 44.5 01/19/2023   MCV 91 01/19/2023   PLT 234 01/19/2023   Lab Results  Component Value Date   HGBA1C 5.7 (H) 11/24/2023   Lab Results  Component Value Date   CHOL 198 11/24/2023   HDL 50 11/24/2023   LDLCALC 107 (H) 11/24/2023   TRIG 236 (H) 11/24/2023   CHOLHDL 4.0 11/24/2023   Lab Results  Component Value Date   TSH 1.810 01/19/2023     Assessment and Plan:  Chronic bilateral low back pain without sciatica Assessment & Plan: Will refer to [facility] for consideration of water-based PT  Orders: -     Ambulatory referral to Physical Therapy  Tobacco use disorder, continuous Assessment & Plan: Congratulated on progress so far, continue working toward total cessation.   Intertrigo Assessment & Plan: Resolved.  Patient will hold onto topical nystatin  in case needed in the future.      Return  in about 3 months (around 06/15/2024) for OV f/u chronic conditions.    Cody Das, PA-C, DMSc, Nutritionist [facility] [phone]

## 2024-03-15 NOTE — Assessment & Plan Note (Signed)
 Resolved.  Patient will hold onto topical nystatin  in case needed in the future.

## 2024-03-15 NOTE — Assessment & Plan Note (Signed)
 Will refer to [facility] Physical Therapy for consideration of water-based PT

## 2024-03-15 NOTE — Assessment & Plan Note (Signed)
 Congratulated on progress so far, continue working toward total cessation.

## 2024-03-16 ENCOUNTER — Other Ambulatory Visit: Payer: Self-pay | Admitting: Physician Assistant

## 2024-03-16 DIAGNOSIS — M810 Age-related osteoporosis without current pathological fracture: Secondary | ICD-10-CM

## 2024-03-20 NOTE — Telephone Encounter (Signed)
 Requested Prescriptions  Pending Prescriptions Disp Refills   alendronate  (FOSAMAX ) 70 MG tablet [Pharmacy Med Name: ALENDRONATE  70MG  TABLETS] 12 tablet 0    Sig: TAKE 1 TABLET(70 MG) BY MOUTH EVERY 7 DAYS WITH A FULL GLASS OF WATER AND ON AN EMPTY STOMACH     Endocrinology:  Bisphosphonates Failed - 03/20/2024  8:26 AM      Failed - Phosphate in normal range and within 360 days    No results found for: "PHOS"       Passed - Ca in normal range and within 360 days    Calcium   Date Value Ref Range Status  11/24/2023 9.2 8.7 - 10.3 mg/dL Final         Passed - Vitamin D  in normal range and within 360 days    25-Hydroxy, Vitamin D -3  Date Value Ref Range Status  05/24/2023 34 ng/mL Final    Comment:    This test was developed and its performance characteristics determined by [facility]. It has not been cleared or approved by the Food and Drug Administration.    25-Hydroxy, Vitamin D -2  Date Value Ref Range Status  05/24/2023 <1.0 ng/mL Final    Comment:    This test was developed and its performance characteristics determined by [facility]. It has not been cleared or approved by the Food and Drug Administration.    25-Hydroxy, Vitamin D   Date Value Ref Range Status  05/24/2023 34 ng/mL Final    Comment:    Reference Range: All Ages: Target levels 30 - 100    Vit D, 25-Hydroxy  Date Value Ref Range Status  01/19/2023 37.7 30.0 - 100.0 ng/mL Final    Comment:    Vitamin D  deficiency has been defined by the Institute of Medicine and an Endocrine Society practice guideline as a level of serum 25-OH vitamin D  less than 20 ng/mL (1,2). The Endocrine Society went on to further define vitamin D  insufficiency as a level between 21 and 29 ng/mL (2). 1. IOM (Institute of Medicine). 2010. Dietary reference    intakes for calcium  and D. [place]: The    [employer]. 2. Holick MF, Binkley Canones, Bischoff-Ferrari HA, et al.    Evaluation, treatment, and prevention of  vitamin D     deficiency: an Endocrine Society clinical practice    guideline. JCEM. 2011 Jul; 96(7):1911-30.          Passed - Cr in normal range and within 360 days    Creatinine, Ser  Date Value Ref Range Status  11/24/2023 0.75 0.57 - 1.00 mg/dL Final         Passed - Mg Level in normal range and within 360 days    Magnesium  Date Value Ref Range Status  05/24/2023 1.9 1.6 - 2.3 mg/dL Final         Passed - eGFR is 30 or above and within 360 days    eGFR  Date Value Ref Range Status  11/24/2023 91 >59 mL/min/1.73 Final         Passed - Valid encounter within last 12 months    Recent Outpatient Visits           5 days ago Chronic bilateral low back pain without sciatica   [facility] at [facility], Arleen [place]acer, [place]   1 month ago [facility] at [facility], Arleen Lacer, [place]  Passed - Bone Mineral Density or Dexa Scan completed in the last 2 years

## 2024-04-24 ENCOUNTER — Other Ambulatory Visit: Payer: Self-pay | Admitting: Physician Assistant

## 2024-04-24 DIAGNOSIS — E782 Mixed hyperlipidemia: Secondary | ICD-10-CM

## 2024-04-26 NOTE — Telephone Encounter (Signed)
 Requested Prescriptions  Pending Prescriptions Disp Refills   atorvastatin  (LIPITOR) 20 MG tablet [Pharmacy Med Name: ATORVASTATIN  20MG  TABLETS] 90 tablet 0    Sig: TAKE 1 TABLET(20 MG) BY MOUTH DAILY     Cardiovascular:  Antilipid - Statins Failed - 04/26/2024 11:05 AM      Failed - Lipid Panel in normal range within the last 12 months    Cholesterol, Total  Date Value Ref Range Status  11/24/2023 198 100 - 199 mg/dL Final   LDL Chol Calc (NIH)  Date Value Ref Range Status  11/24/2023 107 (H) 0 - 99 mg/dL Final   HDL  Date Value Ref Range Status  11/24/2023 50 >39 mg/dL Final   Triglycerides  Date Value Ref Range Status  11/24/2023 236 (H) 0 - 149 mg/dL Final         Passed - Patient is not pregnant      Passed - Valid encounter within last 12 months    Recent Outpatient Visits           1 month ago Chronic bilateral low back pain without sciatica   [facility] at [facility], [place]leen [place], [place]   2 months ago [facility]   [facility] Medicine at [facility], Arleen Lacer, [place]

## 2024-05-03 ENCOUNTER — Ambulatory Visit: Admitting: Emergency Medicine

## 2024-05-03 VITALS — Ht 66.0 in | Wt 236.0 lb

## 2024-05-03 DIAGNOSIS — Z0000 Encounter for general adult medical examination without abnormal findings: Secondary | ICD-10-CM | POA: Diagnosis not present

## 2024-05-03 DIAGNOSIS — Z1231 Encounter for screening mammogram for malignant neoplasm of breast: Secondary | ICD-10-CM

## 2024-05-03 NOTE — Patient Instructions (Signed)
 Ms. Kelsey Rose , Thank you for taking time out of your busy schedule to complete your Annual Wellness Visit with me. I enjoyed our conversation and look forward to speaking with you again next year. I, as well as your care team,  appreciate your ongoing commitment to your health goals. Please review the following plan we discussed and let me know if I can assist you in the future. Your Game plan/ To Do List    Referrals: None   Follow up Visits: Next Medicare AWV with our clinical staff: 05/09/25 @ 2:40pm (VIDEO VISIT)   Have you seen your provider in the last 6 months (3 months if uncontrolled diabetes)? Yes Next Office Visit with your provider: 06/15/24 @ 1:20pm with Toribio Hoyle, PA  Clinician Recommendations:  Aim for 30 minutes of exercise or brisk walking, 6-8 glasses of water, and 5 servings of fruits and vegetables each day. I have placed an order for a mammogram and/or bone density scan, please call  Imaging @ [phone] to schedule at your earliest convenience. Call to schedule an eye exam at your earliest convenience.      This is a list of the screening recommended for you and due dates:  Health Maintenance  Topic Date Due   COVID-19 Vaccine (1) Never done   HIV Screening  Never done   Zoster (Shingles) Vaccine (1 of 2) Never done   Pneumococcal Vaccination (2 of 2 - PCV) 08/31/2015   Mammogram  02/17/2024   Pap with HPV screening  04/16/2024   Flu Shot  06/09/2024   Screening for Lung Cancer  07/13/2024   Colon Cancer Screening  09/15/2024   Medicare Annual Wellness Visit  05/03/2025   DTaP/Tdap/Td vaccine (2 - Td or Tdap) 04/13/2033   Hepatitis C Screening  Completed   Hepatitis B Vaccine  Aged Out   HPV Vaccine  Aged Out   Meningitis B Vaccine  Aged Out    Advanced directives: (Copy Requested) Please bring a copy of your health care power of attorney and living will to the office to be added to your chart at your convenience. You can mail to [facility]  [address] [place], [place] [address] or email to [email or web address] Advance Care Planning is important because it:  [x]  Makes sure you receive the medical care that is consistent with your values, goals, and preferences  [x]  It provides guidance to your family and loved ones and reduces their decisional burden about whether or not they are making the right decisions based on your wishes.  Follow the link provided in your after visit summary or read over the paperwork we have mailed to you to help you started getting your Advance Directives in place. If you need assistance in completing these, please reach out to us  so that we can help you!  See attachments for Preventive Care and Fall Prevention Tips.   Fall Prevention in the Home, Adult Falls can cause injuries and affect people of all ages. There are many simple things that you can do to make your home safe and to help prevent falls. If you need it, ask for help making these changes. What actions can I take to prevent falls? General information Use good lighting in all rooms. Make sure to: Replace any light bulbs that burn out. Turn on lights if it is dark and use night-lights. Keep items that you use often in easy-to-reach places. Lower the shelves around your home if needed. Move furniture so  that there are clear paths around it. Do not keep throw rugs or other things on the floor that can make you trip. If any of your floors are uneven, fix them. Add color or contrast paint or tape to clearly mark and help you see: Grab bars or handrails. First and last steps of staircases. Where the edge of each step is. If you use a ladder or stepladder: Make sure that it is fully opened. Do not climb a closed ladder. Make sure the sides of the ladder are locked in place. Have someone hold the ladder while you use it. Know where your pets are as you move through your home. What can I do in the bathroom?     Keep the  floor dry. Clean up any water that is on the floor right away. Remove soap buildup in the bathtub or shower. Buildup makes bathtubs and showers slippery. Use non-skid mats or decals on the floor of the bathtub or shower. Attach bath mats securely with double-sided, non-slip rug tape. If you need to sit down while you are in the shower, use a non-slip stool. Install grab bars by the toilet and in the bathtub and shower. Do not use towel bars as grab bars. What can I do in the bedroom? Make sure that you have a light by your bed that is easy to reach. Do not use any sheets or blankets on your bed that hang to the floor. Have a firm bench or chair with side arms that you can use for support when you get dressed. What can I do in the kitchen? Clean up any spills right away. If you need to reach something above you, use a sturdy step stool that has a grab bar. Keep electrical cables out of the way. Do not use floor polish or wax that makes floors slippery. What can I do with my stairs? Do not leave anything on the stairs. Make sure that you have a light switch at the top and the bottom of the stairs. Have them installed if you do not have them. Make sure that there are handrails on both sides of the stairs. Fix handrails that are broken or loose. Make sure that handrails are as long as the staircases. Install non-slip stair treads on all stairs in your home if they do not have carpet. Avoid having throw rugs at the top or bottom of stairs, or secure the rugs with carpet tape to prevent them from moving. Choose a carpet design that does not hide the edge of steps on the stairs. Make sure that carpet is firmly attached to the stairs. Fix any carpet that is loose or worn. What can I do on the outside of my home? Use bright outdoor lighting. Repair the edges of walkways and driveways and fix any cracks. Clear paths of anything that can make you trip, such as tools or rocks. Add color or contrast  paint or tape to clearly mark and help you see high doorway thresholds. Trim any bushes or trees on the main path into your home. Check that handrails are securely fastened and in good repair. Both sides of all steps should have handrails. Install guardrails along the edges of any raised decks or porches. Have leaves, snow, and ice cleared regularly. Use sand, salt, or ice melt on walkways during winter months if you live where there is ice and snow. In the garage, clean up any spills right away, including grease or oil spills. What other  actions can I take? Review your medicines with your health care provider. Some medicines can make you confused or feel dizzy. This can increase your chance of falling. Wear closed-toe shoes that fit well and support your feet. Wear shoes that have rubber soles and low heels. Use a cane, walker, scooter, or crutches that help you move around if needed. Talk with your provider about other ways that you can decrease your risk of falls. This may include seeing a physical therapist to learn to do exercises to improve movement and strength. Where to find more information Centers for Disease Control and Prevention, [employer]: TonerPromos.no [employer] on Aging: BaseRingTones.pl National Institute on Aging: BaseRingTones.pl Contact a health care provider if: You are afraid of falling at home. You feel weak, drowsy, or dizzy at home. You fall at home. Get help right away if you: Lose consciousness or have trouble moving after a fall. Have a fall that causes a head injury. These symptoms may be an emergency. Get help right away. Call 911. Do not wait to see if the symptoms will go away. Do not drive yourself to the hospital. This information is not intended to replace advice given to you by your health care provider. Make sure you discuss any questions you have with your health care provider. Document Revised: 06/29/2022 Document Reviewed: 06/29/2022 Elsevier Patient Education   2024 [employer].

## 2024-05-03 NOTE — Progress Notes (Signed)
 Subjective:   Kelsey Rose is a 63 y.o. who presents for a Medicare Wellness preventive visit.  As a reminder, Annual Wellness Visits don't include a physical exam, and some assessments may be limited, especially if this visit is performed virtually. We may recommend an in-person follow-up visit with your provider if needed.  Visit Complete: Virtual I connected with  Kelsey Rose on 05/03/24 by a audio enabled telemedicine application and verified that I am speaking with the correct person using two identifiers.  Patient Location: Home  Provider Location: Home Office  I discussed the limitations of evaluation and management by telemedicine. The patient expressed understanding and agreed to proceed.  Vital Signs: Because this visit was a virtual/telehealth visit, some criteria may be missing or patient reported. Any vitals not documented were not able to be obtained and vitals that have been documented are patient reported.  VideoDeclined- This patient declined Librarian, academic. Therefore the visit was completed with audio only.  Persons Participating in Visit: Patient.  AWV Questionnaire: Yes: Patient Medicare AWV questionnaire was completed by the patient on 05/01/24; I have confirmed that all information answered by patient is correct and no changes since this date.  Cardiac Risk Factors include: obesity (BMI >30kg/m2);dyslipidemia;smoking/ tobacco exposure;sedentary lifestyle;Other (see comment), Risk factor comments: CAD     Objective:    Today's Vitals   05/03/24 1433  Weight: 236 lb (107 kg)  Height: 5' 6 (1.676 m)  PainSc: 6    Body mass index is 38.09 kg/m.     05/03/2024    2:54 PM 11/22/2023    2:59 PM 10/14/2023    1:46 PM 09/28/2023    8:28 AM 09/16/2023    8:01 AM 08/31/2023    1:55 PM 08/17/2023    8:19 AM  Advanced Directives  Does Patient Have a Medical Advance Directive? Yes No No No No No No  Type of Advance  Directive Living will        Does patient want to make changes to medical advance directive? No - Patient declined        Would patient like information on creating a medical advance directive?  No - Patient declined Yes (MAU/Ambulatory/Procedural Areas - Information given) No - Patient declined No - Patient declined No - Patient declined     Current Medications (verified) Outpatient Encounter Medications as of 05/03/2024  Medication Sig   albuterol  (ACCUNEB ) 1.25 MG/3ML nebulizer solution Take 1 ampule by nebulization every 6 (six) hours as needed for wheezing.   albuterol  (VENTOLIN  HFA) 108 (90 Base) MCG/ACT inhaler Inhale 2 puffs into the lungs every 6 (six) hours as needed for wheezing or shortness of breath.   alendronate  (FOSAMAX ) 70 MG tablet TAKE 1 TABLET(70 MG) BY MOUTH EVERY 7 DAYS WITH A FULL GLASS OF WATER AND ON AN EMPTY STOMACH   atorvastatin  (LIPITOR) 20 MG tablet TAKE 1 TABLET(20 MG) BY MOUTH DAILY   Biotin 5000 MCG TABS Take 1 tablet by mouth daily.   buPROPion  (WELLBUTRIN  SR) 150 MG 12 hr tablet Take 1 tablet (150 mg total) by mouth daily.   citalopram  (CELEXA ) 40 MG tablet Take 1 tablet (40 mg total) by mouth daily.   KLAYESTA  powder Apply 1 Application topically 3 (three) times daily as needed.   lidocaine  (LIDODERM ) 5 % Place 1 patch onto the skin daily as needed. Remove & Discard patch within 12 hours or as directed by MD   mirtazapine  (REMERON ) 7.5 MG tablet Take 7.5  mg by mouth as needed.   nystatin  cream (MYCOSTATIN ) Apply topically 2 (two) times daily.   Tiotropium Bromide-Olodaterol (STIOLTO RESPIMAT) 2.5-2.5 MCG/ACT AERS Inhale 2 each into the lungs daily.   No facility-administered encounter medications on file as of 05/03/2024.    Allergies (verified) Sulfa antibiotics, Iodinated contrast media, and Penicillins   History: Past Medical History:  Diagnosis Date   Anxiety    Arthritis    Asthma    COPD (chronic obstructive pulmonary disease) (HCC)     Depression    Enlarged aorta (HCC)    GERD (gastroesophageal reflux disease)    Past Surgical History:  Procedure Laterality Date   ABDOMINAL HYSTERECTOMY  63 yrs old   BIOPSY  09/16/2023   Procedure: BIOPSY;  Surgeon: Jinny Carmine, MD;  Location: [facility] ENDOSCOPY;  Service: Endoscopy;;   COLONOSCOPY WITH PROPOFOL  N/A 09/16/2023   Procedure: COLONOSCOPY WITH PROPOFOL ;  Surgeon: Jinny Carmine, MD;  Location: [facility] ENDOSCOPY;  Service: Endoscopy;  Laterality: N/A;   JOINT REPLACEMENT Right  May 2015   Shoulder replacement   TONSILLECTOMY AND ADENOIDECTOMY  Age 61   Family History  Problem Relation Age of Onset   Gallstones Mother    Dementia Mother    Osteoarthritis Mother    Cirrhosis Father    Depression Sister    COPD Sister    Depression Sister    COPD Sister    Heart attack Brother    CAD Brother    Diabetes Daughter    Social History   Socioeconomic History   Marital status: Single    Spouse name: Not on file   Number of children: 1   Years of education: Not on file   Highest education level: 10th grade  Occupational History   Occupation: disability  Tobacco Use   Smoking status: Every Day    Current packs/day: 0.10    Average packs/day: 1 pack/day for 45.5 years (44.6 ttl pk-yrs)    Types: Cigarettes    Start date: 1980   Smokeless tobacco: Never  Vaping Use   Vaping status: Never Used  Substance and Sexual Activity   Alcohol use: No   Drug use: Not Currently   Sexual activity: Not Currently    Birth control/protection: None  Other Topics Concern   Not on file  Social History Narrative   Daughter passed away 09/20/2022   Social Drivers of Health   Financial Resource Strain: Medium Risk (05/03/2024)   Overall Financial Resource Strain (CARDIA)    Difficulty of Paying Living Expenses: Somewhat hard  Food Insecurity: Food Insecurity Present (05/03/2024)   Hunger Vital Sign    Worried About Running Out of Food in the Last Year: Sometimes true    Ran Out of Food  in the Last Year: Sometimes true  Transportation Needs: No Transportation Needs (05/03/2024)   PRAPARE - Administrator, Civil Service (Medical): No    Lack of Transportation (Non-Medical): No  Physical Activity: Sufficiently Active (05/03/2024)   Exercise Vital Sign    Days of Exercise per Week: 6 days    Minutes of Exercise per Session: 30 min  Stress: Stress Concern Present (05/03/2024)   [employer] of Occupational Health - Occupational Stress Questionnaire    Feeling of Stress: Very much  Social Connections: Socially Isolated (05/03/2024)   Social Connection and Isolation Panel    Frequency of Communication with Friends and Family: More than three times a week    Frequency of Social Gatherings with Friends and Family:  Three times a week    Attends Religious Services: Never    Active Member of Clubs or Organizations: No    Attends Banker Meetings: Never    Marital Status: Never married    Tobacco Counseling Ready to quit: Not Answered Counseling given: Not Answered    Clinical Intake:  Pre-visit preparation completed: Yes  Pain : 0-10 Pain Score: 6  Pain Type: Chronic pain Pain Location: Back Pain Descriptors / Indicators: Aching     BMI - recorded: 38.09 Nutritional Status: BMI > 30  Obese Nutritional Risks: None Diabetes: No  Lab Results  Component Value Date   HGBA1C 5.7 (H) 11/24/2023   HGBA1C 5.5 12/20/2014     How often do you need to have someone help you when you read instructions, pamphlets, or other written materials from your doctor or pharmacy?: 1 - Never  Interpreter Needed?: No  Information entered by :: Vina Ned, CMA   Activities of Daily Living     05/03/2024    2:37 PM 05/01/2024    3:18 AM  In your present state of health, do you have any difficulty performing the following activities:  Hearing? 0 0  Vision? 1 1  Comment needs new glasses   Difficulty concentrating or making decisions? 0 0  Walking  or climbing stairs? 1 1  Comment rollator   Dressing or bathing? 1 1  Comment has someone that comes in twice a week   Doing errands, shopping? 1 1  Comment drives, but sometimes has difficulty with shopping   Preparing Food and eating ? N Y  Using the Toilet? N Y  In the past six months, have you accidently leaked urine? Y Y  Comment sometimes   Do you have problems with loss of bowel control? Y Y  Comment has IBS   Managing your Medications? N N  Managing your Finances? N N  Housekeeping or managing your Housekeeping? CINDERELLA CINDERELLA  Comment has someone that comes in twice a week     Patient Care Team: Manya Toribio SQUIBB, PA as PCP - General (Physician Assistant) Mevelyn Charleston, OD as Referring Physician (Optometry)  I have updated your Care Teams any recent Medical Services you may have received from other providers in the past year.     Assessment:   This is a routine wellness examination for Kelsey Rose.  Hearing/Vision screen Hearing Screening - Comments:: Denies hearing loss Vision Screening - Comments:: Gets routine eye exams, Dr Charleston Mevelyn, [place]   Goals Addressed             This Visit's Progress    Patient Stated       Join [employer] so I can swim and exercise       Depression Screen     05/03/2024    2:49 PM 03/15/2024    1:38 PM 02/11/2024    1:30 PM 11/24/2023    2:24 PM 11/22/2023    2:58 PM 10/14/2023    1:46 PM 09/28/2023    8:28 AM  PHQ 2/9 Scores  PHQ - 2 Score 4 5 4 6 1 1 2   PHQ- 9 Score 12 17 14 18        Fall Risk     05/03/2024    2:56 PM 05/01/2024    3:18 AM 03/15/2024    1:38 PM 02/11/2024    1:30 PM 11/24/2023    2:23 PM  Fall Risk   Falls in the past year? 0 0 1  0 1  Number falls in past yr: 0 0 0 0 0  Injury with Fall? 0 0 0 0 1  Risk for fall due to : Orthopedic patient;Impaired balance/gait;Impaired mobility  No Fall Risks No Fall Risks History of fall(s)  Follow up Falls evaluation completed;Education provided  Falls evaluation completed  Falls evaluation completed Falls evaluation completed    MEDICARE RISK AT HOME:  Medicare Risk at Home Any stairs in or around the home?: No If so, are there any without handrails?: No Home free of loose throw rugs in walkways, pet beds, electrical cords, etc?: Yes Adequate lighting in your home to reduce risk of falls?: Yes Life alert?: No Use of a cane, walker or w/c?: Yes (rollator) Grab bars in the bathroom?: Yes Shower chair or bench in shower?: Yes Elevated toilet seat or a handicapped toilet?: Yes  TIMED UP AND GO:  Was the test performed?  No  Cognitive Function: 6CIT completed        05/03/2024    2:57 PM 03/03/2023    1:15 PM  6CIT Screen  What Year? 0 points 0 points  What month? 3 points 0 points  What time? 0 points 0 points  Count back from 20 0 points 0 points  Months in reverse 0 points 0 points  Repeat phrase 0 points 0 points  Total Score 3 points 0 points    Immunizations Immunization History  Administered Date(s) Administered   Influenza-Unspecified 10/22/2015   Pneumococcal Polysaccharide-23 08/30/2014   Tdap 04/14/2023    Screening Tests Health Maintenance  Topic Date Due   COVID-19 Vaccine (1) Never done   HIV Screening  Never done   Zoster Vaccines- Shingrix (1 of 2) Never done   Pneumococcal Vaccine 9-62 Years old (2 of 2 - PCV) 08/31/2015   MAMMOGRAM  02/17/2024   Cervical Cancer Screening (HPV/Pap Cotest)  04/16/2024   INFLUENZA VACCINE  06/09/2024   Lung Cancer Screening  07/13/2024   Colonoscopy  09/15/2024   Medicare Annual Wellness (AWV)  05/03/2025   DTaP/Tdap/Td (2 - Td or Tdap) 04/13/2033   Hepatitis C Screening  Completed   Hepatitis B Vaccines  Aged Out   HPV VACCINES  Aged Out   Meningococcal B Vaccine  Aged Out    Health Maintenance  Health Maintenance Due  Topic Date Due   COVID-19 Vaccine (1) Never done   HIV Screening  Never done   Zoster Vaccines- Shingrix (1 of 2) Never done   Pneumococcal Vaccine 10-38  Years old (2 of 2 - PCV) 08/31/2015   MAMMOGRAM  02/17/2024   Cervical Cancer Screening (HPV/Pap Cotest)  04/16/2024   Health Maintenance Items Addressed: Mammogram ordered, See Nurse Notes at the end of this note  Additional Screening:  Vision Screening: Recommended annual ophthalmology exams for early detection of glaucoma and other disorders of the eye. Would you like a referral to an eye doctor? No    Dental Screening: Recommended annual dental exams for proper oral hygiene  Community Resource Referral / Chronic Care Management: CRR required this visit?  No   CCM required this visit?  No   Plan:    I have personally reviewed and noted the following in the patient's chart:   Medical and social history Use of alcohol, tobacco or illicit drugs  Current medications and supplements including opioid prescriptions. Patient is not currently taking opioid prescriptions. Functional ability and status Nutritional status Physical activity Advanced directives List of other physicians Hospitalizations, surgeries, and ER  visits in previous 12 months Vitals Screenings to include cognitive, depression, and falls Referrals and appointments  In addition, I have reviewed and discussed with patient certain preventive protocols, quality metrics, and best practice recommendations. A written personalized care plan for preventive services as well as general preventive health recommendations were provided to patient.   Vina Ned, CMA   05/03/2024   After Visit Summary: (MyChart) Due to this being a telephonic visit, the after visit summary with patients personalized plan was offered to patient via MyChart   Notes:  6 CIT Score - 3 Placed order for [facility] ([facility]) Needs pneumonia vaccine Needs Shingrix vaccine (pharmacy) Declined Covid vaccine

## 2024-05-11 ENCOUNTER — Other Ambulatory Visit: Payer: Self-pay | Admitting: Physician Assistant

## 2024-05-11 DIAGNOSIS — F331 Major depressive disorder, recurrent, moderate: Secondary | ICD-10-CM

## 2024-05-15 NOTE — Telephone Encounter (Signed)
 Requested Prescriptions  Pending Prescriptions Disp Refills   citalopram  (CELEXA ) 40 MG tablet [Pharmacy Med Name: CITALOPRAM  40MG  TABLETS] 90 tablet 3    Sig: TAKE 1 TABLET(40 MG) BY MOUTH DAILY     Psychiatry:  Antidepressants - SSRI Passed - 05/15/2024  2:24 PM      Passed - Completed PHQ-2 or PHQ-9 in the last 360 days      Passed - Valid encounter within last 6 months    Recent Outpatient Visits           2 months ago Chronic bilateral low back pain without sciatica   [facility] at [facility], Toribio SQUIBB, [place]   3 months ago Intertrigo   [facility] Medicine at [facility], Toribio SQUIBB, [place]

## 2024-05-18 ENCOUNTER — Other Ambulatory Visit: Payer: Self-pay | Admitting: Physician Assistant

## 2024-05-18 DIAGNOSIS — F17209 Nicotine dependence, unspecified, with unspecified nicotine-induced disorders: Secondary | ICD-10-CM

## 2024-05-19 NOTE — Telephone Encounter (Signed)
 Requested Prescriptions  Refused Prescriptions Disp Refills   buPROPion  (WELLBUTRIN  SR) 150 MG 12 hr tablet [Pharmacy Med Name: BUPROPION  SR 150MG  TABLETS (12 H)] 90 tablet 1    Sig: TAKE 1 TABLET(150 MG) BY MOUTH DAILY     Psychiatry: Antidepressants - bupropion  Passed - 05/19/2024  3:11 PM      Passed - Cr in normal range and within 360 days    Creatinine, Ser  Date Value Ref Range Status  11/24/2023 0.75 0.57 - 1.00 mg/dL Final         Passed - AST in normal range and within 360 days    AST  Date Value Ref Range Status  11/24/2023 14 0 - 40 IU/L Final         Passed - ALT in normal range and within 360 days    ALT  Date Value Ref Range Status  11/24/2023 12 0 - 32 IU/L Final         Passed - Completed PHQ-2 or PHQ-9 in the last 360 days      Passed - Last BP in normal range    BP Readings from Last 1 Encounters:  03/15/24 118/84         Passed - Valid encounter within last 6 months    Recent Outpatient Visits           2 months ago Chronic bilateral low back pain without sciatica   [facility] at [facility], Toribio SQUIBB, [place]   3 months ago Intertrigo   [facility] Primary Care & Sports Medicine at [facility], Toribio SQUIBB, [place]

## 2024-06-15 ENCOUNTER — Encounter: Payer: Self-pay | Admitting: Physician Assistant

## 2024-06-15 ENCOUNTER — Ambulatory Visit (INDEPENDENT_AMBULATORY_CARE_PROVIDER_SITE_OTHER): Admitting: Physician Assistant

## 2024-06-15 VITALS — BP 122/74 | HR 84 | Temp 98.2°F | Ht 66.0 in | Wt 222.0 lb

## 2024-06-15 DIAGNOSIS — E66812 Obesity, class 2: Secondary | ICD-10-CM | POA: Diagnosis not present

## 2024-06-15 DIAGNOSIS — M810 Age-related osteoporosis without current pathological fracture: Secondary | ICD-10-CM

## 2024-06-15 DIAGNOSIS — Z1329 Encounter for screening for other suspected endocrine disorder: Secondary | ICD-10-CM | POA: Diagnosis not present

## 2024-06-15 DIAGNOSIS — Z6835 Body mass index (BMI) 35.0-35.9, adult: Secondary | ICD-10-CM

## 2024-06-15 DIAGNOSIS — J4489 Other specified chronic obstructive pulmonary disease: Secondary | ICD-10-CM

## 2024-06-15 DIAGNOSIS — F17201 Nicotine dependence, unspecified, in remission: Secondary | ICD-10-CM | POA: Diagnosis not present

## 2024-06-15 DIAGNOSIS — F332 Major depressive disorder, recurrent severe without psychotic features: Secondary | ICD-10-CM

## 2024-06-15 MED ORDER — STIOLTO RESPIMAT 2.5-2.5 MCG/ACT IN AERS
2.0000 | INHALATION_SPRAY | Freq: Every day | RESPIRATORY_TRACT | 6 refills | Status: AC
Start: 2024-06-15 — End: ?

## 2024-06-15 MED ORDER — ALENDRONATE SODIUM 70 MG PO TABS: 70.0000 mg | ORAL_TABLET | ORAL | 3 refills | Status: AC

## 2024-06-15 MED ORDER — DEXAMETHASONE 1 MG PO TABS: 1.0000 mg | ORAL_TABLET | Freq: Once | ORAL | 0 refills | Status: AC

## 2024-06-15 NOTE — Progress Notes (Signed)
 Date:  06/15/2024   Name:  Kelsey Rose   DOB:  01-Jan-1961   MRN:  [id number]   Chief Complaint: Medical Management of Chronic Issues  HPI Kelsey Rose returns for routine follow-up primarily wanting to discuss depression today.  It was recently the anniversary of her daughter's passing, and she seems to be losing hope that her grief will improve in time.  She is clearly quite depressed and does not have any interest in activities or other social pursuits.  She denies active SI, stating she enjoys her days with her dog.  She is presently on Celexa , mirtazapine , and bupropion .  Fortunately, she was able to completely quit smoking as of about 1 month ago and seems proud of this.  We have been meaning to screen for Cushing's given her thin extremities with central adiposity.  Medication list has been reviewed and updated.  Current Meds  Medication Sig   albuterol  (ACCUNEB ) 1.25 MG/3ML nebulizer solution Take 1 ampule by nebulization every 6 (six) hours as needed for wheezing.   albuterol  (VENTOLIN  HFA) 108 (90 Base) MCG/ACT inhaler Inhale 2 puffs into the lungs every 6 (six) hours as needed for wheezing or shortness of breath.   atorvastatin  (LIPITOR) 20 MG tablet TAKE 1 TABLET(20 MG) BY MOUTH DAILY   buPROPion  (WELLBUTRIN  SR) 150 MG 12 hr tablet Take 1 tablet (150 mg total) by mouth daily.   citalopram  (CELEXA ) 40 MG tablet TAKE 1 TABLET(40 MG) BY MOUTH DAILY   [EXPIRED] dexamethasone  (DECADRON ) 1 MG tablet Take 1 tablet (1 mg total) by mouth once for 1 dose. TAKE AT 11 PM THE NIGHT BEFORE A FASTING EARLY MORNING BLOOD DRAW   KLAYESTA  powder Apply 1 Application topically 3 (three) times daily as needed.   lidocaine  (LIDODERM ) 5 % Place 1 patch onto the skin daily as needed. Remove & Discard patch within 12 hours or as directed by MD   mirtazapine  (REMERON ) 7.5 MG tablet Take 7.5 mg by mouth as needed.   nystatin  cream (MYCOSTATIN ) Apply topically 2 (two) times daily.   [DISCONTINUED]  alendronate  (FOSAMAX ) 70 MG tablet TAKE 1 TABLET(70 MG) BY MOUTH EVERY 7 DAYS WITH A FULL GLASS OF WATER AND ON AN EMPTY STOMACH   [DISCONTINUED] Biotin 5000 MCG TABS Take 1 tablet by mouth daily.   [DISCONTINUED] Tiotropium Bromide-Olodaterol (STIOLTO RESPIMAT ) 2.5-2.5 MCG/ACT AERS Inhale 2 each into the lungs daily.     Review of Systems  Patient Active Problem List   Diagnosis Date Noted   Intertrigo 02/12/2024   DNR (do not resuscitate) 11/24/2023   Abdominal aortic ectasia (HCC) 11/24/2023   Lumbar facet joint syndrome 08/17/2023   Coronary atherosclerosis of native coronary artery 08/16/2023   Spondylosis without myelopathy or radiculopathy, lumbosacral region 06/28/2023   T12 wedge compression fracture, sequela 06/09/2023   DDD (degenerative disc disease), cervical 06/09/2023   COPD with chronic bronchitis (HCC) 06/09/2023   Osteoarthritis of hips (Bilateral) 06/09/2023   Cervical foraminal stenosis (Right: CC4-5, C5-6, C6-7, C7-T1) 06/09/2023   Cervical facet hypertrophy 06/09/2023   Cervical facet arthropathy 06/09/2023   Cervical facet joint pain 06/09/2023   Osteophytosis of shoulder (Left) 06/09/2023   Osteoarthritis of AC (acromioclavicular) joint (Left) 06/09/2023   Lumbar facet arthropathy 06/09/2023   Lumbar facet joint pain 06/09/2023   Osteopenia determined by x-ray 06/09/2023   Osteopenia of hips (Bilateral) 06/09/2023   Ligamentum flavum hypertrophy (L3-4, L4-5, L5-S1) 06/09/2023   Multilevel degenerative disc disease 06/09/2023   Lumbar intervertebral disc protrusion (Left:  L3-4, L4-5) 06/09/2023   Lumbar foraminal stenosis (Left: L3-4, L5-S1) 06/09/2023   Chronic hip pain (4th area of Pain) (Bilateral) (L>R) 05/24/2023   Chronic shoulder pain (2ry area of Pain) (Bilateral) (L>R) 05/24/2023   Osteoarthritis involving multiple joints 05/24/2023   Chronic neck pain (3ry area of Pain) (Midline) 05/24/2023   Chronic pain syndrome 05/23/2023   Aortic  atherosclerosis (HCC) 03/25/2023   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 03/25/2023   Osteoporosis 03/25/2023   Generalized anxiety disorder 02/19/2023   Insomnia due to other mental disorder 02/19/2023   Benign paroxysmal positional vertigo 04/28/2022   Chronic low back pain (1ry area of Pain) (Bilateral) (L>R) w/o sciatica 04/28/2022   Severe episode of recurrent major depressive disorder, without psychotic features (HCC) 04/28/2022   History of vitamin D  deficiency 04/28/2022   Chronic diarrhea 02/16/2022   Class 2 severe obesity with serious comorbidity in adult (HCC) 08/22/2015   DDD (degenerative disc disease), lumbosacral 08/22/2015   GERD (gastroesophageal reflux disease) 04/25/2015   Hyperlipemia 10/30/2014   Chronic obstructive pulmonary disease (HCC) 03/29/2014   Moderate tobacco use disorder, in early remission 03/29/2014   Asthma 06/05/2013   Pain in shoulder region after shoulder replacement (Right) 11/18/2012    Allergies  Allergen Reactions   Sulfa Antibiotics Swelling    Developed swelling in eyes when using Sulfa eye drops.   Iodinated Contrast Media Nausea Only   Penicillins Nausea And Vomiting    Got really sick, throwing up as child    Immunization History  Administered Date(s) Administered   Influenza-Unspecified 10/22/2015   Pneumococcal Polysaccharide-23 08/30/2014   Tdap 04/14/2023    Past Surgical History:  Procedure Laterality Date   ABDOMINAL HYSTERECTOMY  63 yrs old   BIOPSY  09/16/2023   Procedure: BIOPSY;  Surgeon: Jinny Carmine, MD;  Location: [facility] ENDOSCOPY;  Service: Endoscopy;;   COLONOSCOPY WITH PROPOFOL  N/A 09/16/2023   Procedure: COLONOSCOPY WITH PROPOFOL ;  Surgeon: Jinny Carmine, MD;  Location: [facility] ENDOSCOPY;  Service: Endoscopy;  Laterality: N/A;   JOINT REPLACEMENT Right  May 2015   Shoulder replacement   TONSILLECTOMY AND ADENOIDECTOMY  Age 33    Social History   Tobacco Use   Smoking status: Former    Current packs/day: 0.00     Average packs/day: 1 pack/day for 45.5 years (44.6 ttl pk-yrs)    Types: Cigarettes    Start date: 63    Quit date: 05/09/2024    Years since quitting: 0.1   Smokeless tobacco: Never  Vaping Use   Vaping status: Never Used  Substance Use Topics   Alcohol use: No   Drug use: Not Currently    Family History  Problem Relation Age of Onset   Gallstones Mother    Dementia Mother    Osteoarthritis Mother    Cirrhosis Father    Depression Sister    COPD Sister    Depression Sister    COPD Sister    Heart attack Brother    CAD Brother    Diabetes Daughter         06/15/2024    1:22 PM 03/15/2024    1:39 PM 02/11/2024    1:30 PM 11/24/2023    2:24 PM  GAD 7 : Generalized Anxiety Score  Nervous, Anxious, on Edge 3 1 1 1   Control/stop worrying 3 1 1 1   Worry too much - different things 3 1 2 1   Trouble relaxing 3 1 2  0  Restless 3 1 2 3   Easily annoyed or irritable  3 1 2 3   Afraid - awful might happen 0 0 0 0  Total GAD 7 Score 18 6 10 9   Anxiety Difficulty Extremely difficult Somewhat difficult Somewhat difficult Somewhat difficult       06/15/2024    1:21 PM 05/03/2024    2:49 PM 03/15/2024    1:38 PM  Depression screen PHQ 2/9  Decreased Interest 3 1 2   Down, Depressed, Hopeless 3 3 3   PHQ - 2 Score 6 4 5   Altered sleeping 3 1 1   Tired, decreased energy 3 3 3   Change in appetite 3 3 3   Feeling bad or failure about yourself  0 0 0  Trouble concentrating 2 1 3   Moving slowly or fidgety/restless 0 0 2  Suicidal thoughts 0 0 0  PHQ-9 Score 17 12 17   Difficult doing work/chores Extremely dIfficult Somewhat difficult Extremely dIfficult    BP Readings from Last 3 Encounters:  06/15/24 122/74  03/15/24 118/84  02/11/24 124/80    Wt Readings from Last 3 Encounters:  06/15/24 222 lb (100.7 kg)  05/03/24 236 lb (107 kg)  03/15/24 236 lb (107 kg)    BP 122/74   Pulse 84   Temp 98.2 F (36.8 C)   Ht 5' 6 (1.676 m)   Wt 222 lb (100.7 kg)   SpO2 96%   BMI 35.83  kg/m   Physical Exam Vitals and nursing note reviewed.  Constitutional:      Appearance: Normal appearance.  Cardiovascular:     Rate and Rhythm: Normal rate.  Pulmonary:     Effort: Pulmonary effort is normal.  Abdominal:     General: There is no distension.  Musculoskeletal:        General: Normal range of motion.  Skin:    General: Skin is warm and dry.  Neurological:     Mental Status: She is alert and oriented to person, place, and time.     Gait: Gait is intact.  Psychiatric:        Mood and Affect: Mood is depressed. Affect is tearful.        Thought Content: Thought content does not include suicidal ideation.     Recent Labs     Component Value Date/Time   NA 141 11/24/2023 1519   K 4.6 11/24/2023 1519   CL 101 11/24/2023 1519   CO2 23 11/24/2023 1519   GLUCOSE 91 11/24/2023 1519   BUN 7 (L) 11/24/2023 1519   CREATININE 0.75 11/24/2023 1519   CALCIUM  9.2 11/24/2023 1519   PROT 6.8 11/24/2023 1519   ALBUMIN 3.8 (L) 11/24/2023 1519   AST 14 11/24/2023 1519   ALT 12 11/24/2023 1519   ALKPHOS 102 11/24/2023 1519   BILITOT 0.3 11/24/2023 1519    Lab Results  Component Value Date   WBC 7.2 01/19/2023   HGB 15.3 01/19/2023   HCT 44.5 01/19/2023   MCV 91 01/19/2023   PLT 234 01/19/2023   Lab Results  Component Value Date   HGBA1C 5.7 (H) 11/24/2023   HGBA1C 5.5 12/20/2014   Lab Results  Component Value Date   CHOL 198 11/24/2023   HDL 50 11/24/2023   LDLCALC 107 (H) 11/24/2023   TRIG 236 (H) 11/24/2023   CHOLHDL 4.0 11/24/2023   Lab Results  Component Value Date   TSH 1.810 01/19/2023      Assessment and Plan:  Severe episode of recurrent major depressive disorder, without psychotic features (HCC) Assessment & Plan: Apathy and amotivation seem  to be predominating symptoms at present.  Patient does not wish to change pharmacotherapy right now, but would be worth considering switching bupropion  to Auvelity  if patient amenable in the  future.  She says she might be checking out the local [employer] and I encouraged her to pursue this as I think it would be hugely beneficial for her   COPD with chronic bronchitis (HCC) Assessment & Plan: Refill Stiolto inhaler.  Orders: -     Stiolto Respimat ; Inhale 2 each into the lungs daily.  Dispense: 1 each; Refill: 6  Class 2 severe obesity due to excess calories with serious comorbidity and body mass index (BMI) of 35.0 to 35.9 in adult [facility]) Assessment & Plan: Check on routine labs today  Orders: -     CBC with Differential/Platelet -     Comprehensive metabolic panel with GFR -     TSH -     Lipid panel  Age-related osteoporosis without current pathological fracture Assessment & Plan: Refill Fosamax   Orders: -     Alendronate  Sodium; Take 1 tablet (70 mg total) by mouth once a week. Take with a full glass of water on an empty stomach.  Dispense: 12 tablet; Refill: 3  Encounter for screening for endocrine disorder -     Cortisol, free, Serum -     dexAMETHasone ; Take 1 tablet (1 mg total) by mouth once for 1 dose. TAKE AT 11 PM THE NIGHT BEFORE A FASTING EARLY MORNING BLOOD DRAW  Dispense: 1 tablet; Refill: 0  Moderate tobacco use disorder, in early remission Assessment & Plan: Congratulated on cessation, encouraged ongoing avoidance of tobacco      Return in about 3 months (around 09/15/2024) for OV f/u chronic conditions.    Rolan Hoyle, PA-C, DMSc, Nutritionist [facility] [phone]

## 2024-06-17 NOTE — Assessment & Plan Note (Signed)
 Check on routine labs today

## 2024-06-17 NOTE — Assessment & Plan Note (Signed)
Refill Fosamax

## 2024-06-17 NOTE — Assessment & Plan Note (Signed)
 Congratulated on cessation, encouraged ongoing avoidance of tobacco

## 2024-06-17 NOTE — Assessment & Plan Note (Signed)
 Apathy and amotivation seem to be predominating symptoms at present.  Patient does not wish to change pharmacotherapy right now, but would be worth considering switching bupropion  to Auvelity  if patient amenable in the future.  She says she might be checking out the local [employer] and I encouraged her to pursue this as I think it would be hugely beneficial for her

## 2024-06-17 NOTE — Assessment & Plan Note (Signed)
 Refill Stiolto inhaler.

## 2024-06-19 ENCOUNTER — Telehealth: Payer: Self-pay

## 2024-06-19 DIAGNOSIS — Z1329 Encounter for screening for other suspected endocrine disorder: Secondary | ICD-10-CM | POA: Diagnosis not present

## 2024-06-19 NOTE — Telephone Encounter (Signed)
 Please review. Pt wants CT lung test screening one was ordered last year but it does not have your name on it. KP

## 2024-06-19 NOTE — Telephone Encounter (Signed)
 Sent pt the number to call and schedule a mammogram on Mychart.  KP

## 2024-06-19 NOTE — Telephone Encounter (Signed)
 Copied from CRM #[id number]. Topic: Referral - Request for Referral >> Jun 19, 2024  9:42 AM Leonette SQUIBB wrote: Did the patient discuss referral with their provider in the last year? Yes (If No - schedule appointment) (If Yes - send message)  Appointment offered? Yes  Type of order/referral and detailed reason for visit: CT for Lung screen and mammogram   Preference of office, provider, location: [facility] in [place] [address]  If referral order, have you been seen by this specialty before? Yes (If Yes, this issue or another issue? When? Where?  Can we respond through MyChart? Yes

## 2024-06-20 NOTE — Telephone Encounter (Signed)
 Called pt went over [employer]. She verbalized understanding.  KP

## 2024-06-28 ENCOUNTER — Ambulatory Visit: Payer: Self-pay | Admitting: Physician Assistant

## 2024-06-28 LAB — CBC WITH DIFFERENTIAL/PLATELET
Basophils Absolute: 0 x10E3/uL (ref 0.0–0.2)
Basos: 1 %
EOS (ABSOLUTE): 0.1 x10E3/uL (ref 0.0–0.4)
Eos: 1 %
Hematocrit: 42.8 % (ref 34.0–46.6)
Hemoglobin: 14.1 g/dL (ref 11.1–15.9)
Immature Grans (Abs): 0 x10E3/uL (ref 0.0–0.1)
Immature Granulocytes: 0 %
Lymphocytes Absolute: 1.2 x10E3/uL (ref 0.7–3.1)
Lymphs: 18 %
MCH: 30.5 pg (ref 26.6–33.0)
MCHC: 32.9 g/dL (ref 31.5–35.7)
MCV: 93 fL (ref 79–97)
Monocytes Absolute: 0.2 x10E3/uL (ref 0.1–0.9)
Monocytes: 3 %
Neutrophils Absolute: 5.1 x10E3/uL (ref 1.4–7.0)
Neutrophils: 77 %
Platelets: 215 x10E3/uL (ref 150–450)
RBC: 4.62 x10E6/uL (ref 3.77–5.28)
RDW: 13.1 % (ref 11.7–15.4)
WBC: 6.7 x10E3/uL (ref 3.4–10.8)

## 2024-06-28 LAB — COMPREHENSIVE METABOLIC PANEL WITH GFR
ALT: 9 IU/L (ref 0–32)
AST: 12 IU/L (ref 0–40)
Albumin: 3.8 g/dL — ABNORMAL LOW (ref 3.9–4.9)
Alkaline Phosphatase: 123 IU/L — ABNORMAL HIGH (ref 44–121)
BUN/Creatinine Ratio: 5 — ABNORMAL LOW (ref 12–28)
BUN: 4 mg/dL — ABNORMAL LOW (ref 8–27)
Bilirubin Total: 0.3 mg/dL (ref 0.0–1.2)
CO2: 28 mmol/L (ref 20–29)
Calcium: 8.9 mg/dL (ref 8.7–10.3)
Chloride: 98 mmol/L (ref 96–106)
Creatinine, Ser: 0.85 mg/dL (ref 0.57–1.00)
Globulin, Total: 2.5 g/dL (ref 1.5–4.5)
Glucose: 115 mg/dL — ABNORMAL HIGH (ref 70–99)
Potassium: 3.2 mmol/L — ABNORMAL LOW (ref 3.5–5.2)
Sodium: 143 mmol/L (ref 134–144)
Total Protein: 6.3 g/dL (ref 6.0–8.5)
eGFR: 77 mL/min/1.73 (ref >59)

## 2024-06-28 LAB — TSH: TSH: 1.3 u[IU]/mL (ref 0.450–4.500)

## 2024-06-28 LAB — LIPID PANEL
Chol/HDL Ratio: 2.8 ratio (ref 0.0–4.4)
Cholesterol, Total: 152 mg/dL (ref 100–199)
HDL: 55 mg/dL (ref >39)
LDL Chol Calc (NIH): 82 mg/dL (ref 0–99)
Triglycerides: 80 mg/dL (ref 0–149)
VLDL Cholesterol Cal: 15 mg/dL (ref 5–40)

## 2024-06-28 LAB — CORTISOL, FREE: Cortisol, Free Dialysis, LCMS: 0.026 ug/dL

## 2024-07-05 ENCOUNTER — Other Ambulatory Visit: Payer: Self-pay | Admitting: Physician Assistant

## 2024-07-05 DIAGNOSIS — E782 Mixed hyperlipidemia: Secondary | ICD-10-CM

## 2024-07-06 NOTE — Telephone Encounter (Signed)
 Requested medication (s) are due for refill today: yes  Requested medication (s) are on the active medication list: hx med  Last refill:  04/14/24  Future visit scheduled: no  Notes to clinic:  hx provider/off protocol   Requested Prescriptions  Pending Prescriptions Disp Refills   nystatin  cream (MYCOSTATIN ) [Pharmacy Med Name: NYSTATIN  CREAM 30GM] 30 g     Sig: APPLY TOPICALLY TO THE AFFECTED AREA TWICE DAILY     Off-Protocol Failed - 07/06/2024  2:40 PM      Failed - Medication not assigned to a protocol, review manually.      Passed - Valid encounter within last 12 months    Recent Outpatient Visits           3 weeks ago Severe episode of recurrent major depressive disorder, without psychotic features (HCC)   [place] [facility] at [facility], [place]QUIB[place], [place]   3 months ago Chronic bilateral low back pain without sciatica   [facility] at [facility], [place]QU[place], [place]   4 months ago Intertrigo   [facility] at [facility], Toribio SQUIBB, [place]              Signed Prescriptions Disp Refills   atorvastatin  (LIPITOR) 20 MG tablet 90 tablet 3    Sig: TAKE 1 TABLET(20 MG) BY MOUTH DAILY     Cardiovascular:  Antilipid - Statins Failed - 07/06/2024  2:40 PM      Failed - Lipid Panel in normal range within the last 12 months    Cholesterol, Total  Date Value Ref Range Status  06/19/2024 152 100 - 199 mg/dL Final   LDL Chol Calc (NIH)  Date Value Ref Range Status  06/19/2024 82 0 - 99 mg/dL Final   HDL  Date Value Ref Range Status  06/19/2024 55 >39 mg/dL Final   Triglycerides  Date Value Ref Range Status  06/19/2024 80 0 - 149 mg/dL Final         Passed - Patient is not pregnant      Passed - Valid encounter within last 12 months    Recent Outpatient Visits           3 weeks ago Severe episode of recurrent major depressive disorder, without  psychotic features (HCC)   [place] [facility] at [facility], Toribio SQUIBB, [place]   3 months ago Chronic bilateral low back pain without sciatica   [facility] at [facility], Toribio SQUIBB, [place]   4 months ago Intertrigo   [facility] at [facility], Toribio SQUIBB, [place]

## 2024-07-06 NOTE — Telephone Encounter (Signed)
 Requested Prescriptions  Pending Prescriptions Disp Refills   nystatin  cream (MYCOSTATIN ) [Pharmacy Med Name: NYSTATIN  CREAM 30GM] 30 g     Sig: APPLY TOPICALLY TO THE AFFECTED AREA TWICE DAILY     Off-Protocol Failed - 07/06/2024  2:40 PM      Failed - Medication not assigned to a protocol, review manually.      Passed - Valid encounter within last 12 months    Recent Outpatient Visits           3 weeks ago Severe episode of recurrent major depressive disorder, without psychotic features (HCC)   [place] [facility] at [facility], Toribio SQUIBB, [place]   3 months ago Chronic bilateral low back pain without sciatica   [facility] at [facility], Toribio SQUIB[place], [place]   4 months ago Intertrigo   [facility] Medicine at [facility], Toribio SQUIBB, [place]               atorvastatin  (LIPITOR) 20 MG tablet [Pharmacy Med Name: ATORVASTATIN  20MG  TABLETS] 90 tablet 3    Sig: TAKE 1 TABLET(20 MG) BY MOUTH DAILY     Cardiovascular:  Antilipid - Statins Failed - 07/06/2024  2:40 PM      Failed - Lipid Panel in normal range within the last 12 months    Cholesterol, Total  Date Value Ref Range Status  06/19/2024 152 100 - 199 mg/dL Final   LDL Chol Calc (NIH)  Date Value Ref Range Status  06/19/2024 82 0 - 99 mg/dL Final   HDL  Date Value Ref Range Status  06/19/2024 55 >39 mg/dL Final   Triglycerides  Date Value Ref Range Status  06/19/2024 80 0 - 149 mg/dL Final         Passed - Patient is not pregnant      Passed - Valid encounter within last 12 months    Recent Outpatient Visits           3 weeks ago Severe episode of recurrent major depressive disorder, without psychotic features (HCC)   [place] [facility] at [facility], Toribio SQUIBB, [place]   3 months ago Chronic bilateral low back pain without sciatica   [facility] at  [facility], Tor[place]QU[place], [place]   4 months ago Intertrigo   [place] [facility] at [facility], Toribio SQUIBB, [place]

## 2024-07-26 ENCOUNTER — Other Ambulatory Visit: Payer: Self-pay | Admitting: Acute Care

## 2024-07-26 DIAGNOSIS — Z122 Encounter for screening for malignant neoplasm of respiratory organs: Secondary | ICD-10-CM

## 2024-07-26 DIAGNOSIS — F17210 Nicotine dependence, cigarettes, uncomplicated: Secondary | ICD-10-CM

## 2024-07-26 DIAGNOSIS — Z87891 Personal history of nicotine dependence: Secondary | ICD-10-CM

## 2024-08-13 ENCOUNTER — Other Ambulatory Visit: Payer: Self-pay | Admitting: Physician Assistant

## 2024-08-15 ENCOUNTER — Ambulatory Visit: Admission: RE | Admit: 2024-08-15 | Source: Ambulatory Visit

## 2024-08-15 NOTE — Telephone Encounter (Signed)
 Requested Prescriptions  Pending Prescriptions Disp Refills   lidocaine  (LIDODERM ) 5 % [Pharmacy Med Name: LIDOCAINE  5% PATCH] 15 patch 2    Sig: UNWRAP AND APPLY 1 PATCH TO SKIN DAILY. REMOVE AND DISCARD PATCH WITHIN 12 HOURS OR AS DIRECTED.     Analgesics:  Topicals Failed - 08/15/2024  9:22 AM      Failed - Manual Review: Labs are only required if the patient has taken medication for more than 8 weeks.      Passed - PLT in normal range and within 360 days    Platelets  Date Value Ref Range Status  06/19/2024 215 150 - 450 x10E3/uL Final         Passed - HGB in normal range and within 360 days    Hemoglobin  Date Value Ref Range Status  06/19/2024 14.1 11.1 - 15.9 g/dL Final         Passed - HCT in normal range and within 360 days    Hematocrit  Date Value Ref Range Status  06/19/2024 42.8 34.0 - 46.6 % Final         Passed - Cr in normal range and within 360 days    Creatinine, Ser  Date Value Ref Range Status  06/19/2024 0.85 0.57 - 1.00 mg/dL Final         Passed - eGFR is 30 or above and within 360 days    eGFR  Date Value Ref Range Status  06/19/2024 77 >59 mL/min/1.73 Final         Passed - Patient is not pregnant      Passed - Valid encounter within last 12 months    Recent Outpatient Visits           2 months ago Severe episode of recurrent major depressive disorder, without psychotic features (HCC)   [place] [facility] at [facility], Toribio SQUIBB, [place]   5 months ago Chronic bilateral low back pain without sciatica   [facility] at [facility], Toribio SQUIBB, [place]   6 months ago [facility] at [facility], Toribio SQUIBB, [place]

## 2024-08-16 ENCOUNTER — Ambulatory Visit: Admission: RE | Admit: 2024-08-16 | Source: Ambulatory Visit

## 2024-08-17 ENCOUNTER — Ambulatory Visit

## 2024-08-18 ENCOUNTER — Ambulatory Visit
Admission: RE | Admit: 2024-08-18 | Discharge: 2024-08-18 | Disposition: A | Discharge status: Home / Self Care | Source: Ambulatory Visit | Attending: Acute Care | Admitting: Acute Care

## 2024-08-18 DIAGNOSIS — Z122 Encounter for screening for malignant neoplasm of respiratory organs: Secondary | ICD-10-CM | POA: Diagnosis not present

## 2024-08-18 DIAGNOSIS — F17210 Nicotine dependence, cigarettes, uncomplicated: Secondary | ICD-10-CM | POA: Insufficient documentation

## 2024-08-18 DIAGNOSIS — Z87891 Personal history of nicotine dependence: Secondary | ICD-10-CM | POA: Insufficient documentation

## 2024-08-22 ENCOUNTER — Other Ambulatory Visit: Payer: Self-pay

## 2024-08-22 DIAGNOSIS — F17210 Nicotine dependence, cigarettes, uncomplicated: Secondary | ICD-10-CM

## 2024-08-22 DIAGNOSIS — Z87891 Personal history of nicotine dependence: Secondary | ICD-10-CM

## 2024-08-22 DIAGNOSIS — Z122 Encounter for screening for malignant neoplasm of respiratory organs: Secondary | ICD-10-CM

## 2024-08-29 ENCOUNTER — Other Ambulatory Visit: Payer: Self-pay | Admitting: Physician Assistant

## 2024-08-31 NOTE — Telephone Encounter (Signed)
 Requested medication (s) are due for refill today: yes  Requested medication (s) are on the active medication list: yes  Last refill:  07/06/24  Future visit scheduled: yes  Notes to clinic:  Medication not assigned to a protocol, review manually.      Requested Prescriptions  Pending Prescriptions Disp Refills   nystatin  cream (MYCOSTATIN ) [Pharmacy Med Name: NYSTATIN  CREAM 30GM] 30 g 2    Sig: APPLY TOPICALLY TO THE AFFECTED AREA TWICE DAILY     Off-Protocol Failed - 08/31/2024  8:30 AM      Failed - Medication not assigned to a protocol, review manually.      Passed - Valid encounter within last 12 months    Recent Outpatient Visits           2 months ago Severe episode of recurrent major depressive disorder, without psychotic features [facility])   [place] [facility] at [facility], Toribio SQUIBB, [place]   5 months ago Chronic bilateral low back pain without sciatica   [facility] at [facility], [place]ib[place]QU[place]   6 months ago [facility] at [facility], Toribio SQUIBB, [place]

## 2024-09-06 ENCOUNTER — Other Ambulatory Visit: Payer: Self-pay | Admitting: Medical Genetics

## 2024-09-06 DIAGNOSIS — Z006 Encounter for examination for normal comparison and control in clinical research program: Secondary | ICD-10-CM

## 2024-09-15 ENCOUNTER — Ambulatory Visit: Admitting: Physician Assistant

## 2024-09-19 ENCOUNTER — Other Ambulatory Visit: Payer: Self-pay | Admitting: Physician Assistant

## 2024-09-21 ENCOUNTER — Telehealth: Payer: Self-pay

## 2024-09-21 ENCOUNTER — Ambulatory Visit: Admitting: Physician Assistant

## 2024-09-21 ENCOUNTER — Other Ambulatory Visit: Payer: Self-pay

## 2024-09-21 NOTE — Telephone Encounter (Signed)
 Called pt to go over refill for Wellbutrin . Pt wanted it removed from her listed due to not having refill and that she hasn't taken it in 2 weeks. Seeing if she wanted to still take the medication.  KP

## 2024-09-21 NOTE — Telephone Encounter (Signed)
 Requested medication (s) are due for refill today - unsure  Requested medication (s) are on the active medication list -yes  Future visit scheduled -yes  Last refill: 04/14/24  Notes to clinic: listed as historical medication, off protocol- provider review   Requested Prescriptions  Pending Prescriptions Disp Refills   KLAYESTA  powder [Pharmacy Med Name: KLAYESTA  TOP PWDR 100,000 30GM] 30 g     Sig: APPLY ONE APPLICATION TOPICALLY THREE TIMES DAILY AS NEEDED.     Off-Protocol Failed - 09/21/2024 10:36 AM      Failed - Medication not assigned to a protocol, review manually.      Passed - Valid encounter within last 12 months    Recent Outpatient Visits           3 months ago Severe episode of recurrent major depressive disorder, without psychotic features (HCC)   [place] [facility] at [facility], Toribio SQU[place], [place]   6 months ago Chronic bilateral low back pain without sciatica   [facility] at [facility], [place]QU[place], [place]   7 months ago Intertrigo   [facility] Medicine at [facility], Toribio SQUIBB, [place]                 Requested Prescriptions  Pending Prescriptions Disp Refills   KLAYESTA  powder [Pharmacy Med Name: KLAYESTA  TOP PWDR 100,000 30GM] 30 g     Sig: APPLY ONE APPLICATION TOPICALLY THREE TIMES DAILY AS NEEDED.     Off-Protocol Failed - 09/21/2024 10:36 AM      Failed - Medication not assigned to a protocol, review manually.      Passed - Valid encounter within last 12 months    Recent Outpatient Visits           3 months ago Severe episode of recurrent major depressive disorder, without psychotic features [facility])   [place] [facility] at [facility], Toribio SQU[place]   6 months ago Chronic bilateral low back pain without sciatica   [facility] Care [facility] at [facility], [place]   7  months ago [facility] Medicine at [facility], Toribio SQUIBB, [place]

## 2024-09-25 ENCOUNTER — Ambulatory Visit: Admitting: Physician Assistant

## 2024-09-29 ENCOUNTER — Ambulatory Visit: Admitting: Physician Assistant

## 2024-09-29 ENCOUNTER — Other Ambulatory Visit: Payer: Self-pay | Admitting: Physician Assistant

## 2024-09-29 ENCOUNTER — Encounter: Payer: Self-pay | Admitting: Physician Assistant

## 2024-09-29 VITALS — BP 120/84 | HR 87 | Temp 98.1°F | Ht 66.0 in | Wt 211.0 lb

## 2024-09-29 DIAGNOSIS — M545 Low back pain, unspecified: Secondary | ICD-10-CM

## 2024-09-29 DIAGNOSIS — M25511 Pain in right shoulder: Secondary | ICD-10-CM | POA: Diagnosis not present

## 2024-09-29 DIAGNOSIS — J4489 Other specified chronic obstructive pulmonary disease: Secondary | ICD-10-CM

## 2024-09-29 DIAGNOSIS — Z23 Encounter for immunization: Secondary | ICD-10-CM

## 2024-09-29 DIAGNOSIS — G8929 Other chronic pain: Secondary | ICD-10-CM

## 2024-09-29 DIAGNOSIS — F332 Major depressive disorder, recurrent severe without psychotic features: Secondary | ICD-10-CM | POA: Diagnosis not present

## 2024-09-29 MED ORDER — CELECOXIB 200 MG PO CAPS: 200.0000 mg | ORAL_CAPSULE | Freq: Every day | ORAL | 1 refills | Status: DC | PRN

## 2024-09-29 MED ORDER — AUVELITY 45-105 MG PO TBCR: 1.0000 | EXTENDED_RELEASE_TABLET | Freq: Two times a day (BID) | ORAL | Status: AC

## 2024-09-29 MED ORDER — IPRATROPIUM-ALBUTEROL 0.5-2.5 (3) MG/3ML IN SOLN
3.0000 mL | Freq: Four times a day (QID) | RESPIRATORY_TRACT | 1 refills | Status: AC | PRN
Start: 2024-09-29 — End: ?

## 2024-09-29 NOTE — Assessment & Plan Note (Signed)
 Switch AccuNeb  to DuoNeb, as needed use.  Encouraged smoking cessation

## 2024-09-29 NOTE — Progress Notes (Signed)
 Date:  09/29/2024   Name:  Kelsey Rose   DOB:  06-07-1961   MRN:  [id number]   Chief Complaint: Medical Management of Chronic Issues and Back Pain (Is open to see Dr. Russ)  HPI  Evangelyne returns for routine follow-up on chronic conditions including MDD, COPD, chronic back pain.    It was recently the anniversary of her daughter's passing in October. She remains very depressed and does not have any interest in activities or other social pursuits.   She denies active SI, stating she enjoys her days with her dog.  She is presently on Celexa , mirtazapine .  She was on bupropion  but ran out 2 weeks ago, feels like she was deriving some benefit from this medication.  Last visit she had voiced interest in pursuing [employer] membership, but never got around to it.  Says financially things are tight.  She also has very little motivation likely owing to her severe depression.SABRA  Unfortunately, after quitting smoking for 1 to 2 months, she resumed and is presently smoking about 3 to 4 cigarettes/day.  She says this is her only crutch for when she feels stressed.  She mentions ongoing struggle with her chronic back pain for which she was seen Dr. Tanya in December/January but she has not seen him since. Last year I mentioned Tylenol arthritis minimally effective. Uses a TENS unit along with stretching, lidocaine  patches, IcyHot. Also has other chronic joint pain including R shoulder which was previously replaced.  She continues to complain of the right shoulder pain.  Shortly following last visit, she completed DST for Cushing's screening, but it was negative.  Medication list has been reviewed and updated.  Current Meds  Medication Sig   alendronate  (FOSAMAX ) 70 MG tablet Take 1 tablet (70 mg total) by mouth once a week. Take with a full glass of water on an empty stomach.   atorvastatin  (LIPITOR) 20 MG tablet TAKE 1 TABLET(20 MG) BY MOUTH DAILY   AUVELITY  45-105 MG TBCR Take 1 tablet by  mouth in the morning and at bedtime.   celecoxib  (CELEBREX ) 200 MG capsule Take 1 capsule (200 mg total) by mouth daily as needed (for joint pain). Take with food.  Do not take with other NSAIDs (ibuprofen, naproxen, aspirin, etc.)   citalopram  (CELEXA ) 40 MG tablet TAKE 1 TABLET(40 MG) BY MOUTH DAILY   ipratropium-albuterol  (DUONEB) 0.5-2.5 (3) MG/3ML SOLN Take 3 mLs by nebulization every 6 (six) hours as needed.   KLAYESTA  powder Apply 1 Application topically 3 (three) times daily as needed.   lidocaine  (LIDODERM ) 5 % UNWRAP AND APPLY 1 PATCH TO SKIN DAILY. REMOVE AND DISCARD PATCH WITHIN 12 HOURS OR AS DIRECTED.   mirtazapine  (REMERON ) 7.5 MG tablet Take 7.5 mg by mouth as needed.   nystatin  cream (MYCOSTATIN ) APPLY TOPICALLY TO THE AFFECTED AREA TWICE DAILY   Tiotropium Bromide-Olodaterol (STIOLTO RESPIMAT ) 2.5-2.5 MCG/ACT AERS Inhale 2 each into the lungs daily.   [DISCONTINUED] albuterol  (ACCUNEB ) 1.25 MG/3ML nebulizer solution Take 1 ampule by nebulization every 6 (six) hours as needed for wheezing.     Review of Systems  Patient Active Problem List   Diagnosis Date Noted   Intertrigo 02/12/2024   DNR (do not resuscitate) 11/24/2023   Abdominal aortic ectasia 11/24/2023   Lumbar facet joint syndrome 08/17/2023   Coronary atherosclerosis of native coronary artery 08/16/2023   Spondylosis without myelopathy or radiculopathy, lumbosacral region 06/28/2023   T12 wedge compression fracture, sequela 06/09/2023   DDD (degenerative disc  disease), cervical 06/09/2023   COPD with chronic bronchitis (HCC) 06/09/2023   Osteoarthritis of hips (Bilateral) 06/09/2023   Cervical foraminal stenosis (Right: CC4-5, C5-6, C6-7, C7-T1) 06/09/2023   Cervical facet hypertrophy 06/09/2023   Cervical facet arthropathy 06/09/2023   Cervical facet joint pain 06/09/2023   Osteophytosis of shoulder (Left) 06/09/2023   Osteoarthritis of AC (acromioclavicular) joint (Left) 06/09/2023   Lumbar facet  arthropathy 06/09/2023   Lumbar facet joint pain 06/09/2023   Osteopenia determined by x-ray 06/09/2023   Osteopenia of hips (Bilateral) 06/09/2023   Ligamentum flavum hypertrophy (L3-4, L4-5, L5-S1) 06/09/2023   Multilevel degenerative disc disease 06/09/2023   Lumbar intervertebral disc protrusion (Left: L3-4, L4-5) 06/09/2023   Lumbar foraminal stenosis (Left: L3-4, L5-S1) 06/09/2023   Chronic hip pain (4th area of Pain) (Bilateral) (L>R) 05/24/2023   Chronic right shoulder pain 05/24/2023   Osteoarthritis involving multiple joints 05/24/2023   Chronic neck pain (3ry area of Pain) (Midline) 05/24/2023   Chronic pain syndrome 05/23/2023   Aortic atherosclerosis 03/25/2023   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 03/25/2023   Osteoporosis 03/25/2023   Generalized anxiety disorder 02/19/2023   Insomnia due to other mental disorder 02/19/2023   Benign paroxysmal positional vertigo 04/28/2022   Chronic low back pain (1ry area of Pain) (Bilateral) (L>R) w/o sciatica 04/28/2022   Severe episode of recurrent major depressive disorder, without psychotic features (HCC) 04/28/2022   History of vitamin D  deficiency 04/28/2022   Chronic diarrhea 02/16/2022   Class 2 severe obesity with serious comorbidity in adult 08/22/2015   DDD (degenerative disc disease), lumbosacral 08/22/2015   GERD (gastroesophageal reflux disease) 04/25/2015   Hyperlipemia 10/30/2014   Chronic obstructive pulmonary disease (HCC) 03/29/2014   Moderate tobacco use disorder, in early remission 03/29/2014   Asthma 06/05/2013   Pain in shoulder region after shoulder replacement (Right) 11/18/2012    Allergies  Allergen Reactions   Sulfa Antibiotics Swelling    Developed swelling in eyes when using Sulfa eye drops.   Iodinated Contrast Media Nausea Only   Penicillins Nausea And Vomiting    Got really sick, throwing up as child    Immunization History  Administered Date(s) Administered   Influenza, Seasonal, Injecte,  Preservative Fre 09/29/2024   Influenza-Unspecified 10/22/2015   Pneumococcal Polysaccharide-23 08/30/2014   Tdap 04/14/2023    Past Surgical History:  Procedure Laterality Date   ABDOMINAL HYSTERECTOMY  63 yrs old   BIOPSY  09/16/2023   Procedure: BIOPSY;  Surgeon: Jinny Carmine, MD;  Location: [facility] ENDOSCOPY;  Service: Endoscopy;;   COLONOSCOPY WITH PROPOFOL  N/A 09/16/2023   Procedure: COLONOSCOPY WITH PROPOFOL ;  Surgeon: Jinny Carmine, MD;  Location: [facility] ENDOSCOPY;  Service: Endoscopy;  Laterality: N/A;   JOINT REPLACEMENT Right  May 2015   Shoulder replacement   TONSILLECTOMY AND ADENOIDECTOMY  Age 88    Social History   Tobacco Use   Smoking status: Former    Current packs/day: 0.00    Average packs/day: 1 pack/day for 45.5 years (44.6 ttl pk-yrs)    Types: Cigarettes    Start date: 30    Quit date: 05/09/2024    Years since quitting: 0.3   Smokeless tobacco: Never  Vaping Use   Vaping status: Never Used  Substance Use Topics   Alcohol use: No   Drug use: Not Currently    Family History  Problem Relation Age of Onset   Gallstones Mother    Dementia Mother    Osteoarthritis Mother    Cirrhosis Father    Depression Sister  COPD Sister    Depression Sister    COPD Sister    Heart attack Brother    CAD Brother    Diabetes Daughter         09/29/2024    2:37 PM 06/15/2024    1:22 PM 03/15/2024    1:39 PM 02/11/2024    1:30 PM  GAD 7 : Generalized Anxiety Score  Nervous, Anxious, on Edge 3 3 1 1   Control/stop worrying 3 3 1 1   Worry too much - different things 3 3 1 2   Trouble relaxing 3 3 1 2   Restless 3 3 1 2   Easily annoyed or irritable 3 3 1 2   Afraid - awful might happen 0 0 0 0  Total GAD 7 Score 18 18 6 10   Anxiety Difficulty Extremely difficult Extremely difficult Somewhat difficult Somewhat difficult       09/29/2024    2:37 PM 06/15/2024    1:21 PM 05/03/2024    2:49 PM  Depression screen PHQ 2/9  Decreased Interest 3 3 1   Down, Depressed,  Hopeless 3 3 3   PHQ - 2 Score 6 6 4   Altered sleeping 3 3 1   Tired, decreased energy 3 3 3   Change in appetite  3 3  Feeling bad or failure about yourself  0 0 0  Trouble concentrating 2 2 1   Moving slowly or fidgety/restless 0 0 0  Suicidal thoughts 0 0 0  PHQ-9 Score 14 17  12    Difficult doing work/chores Somewhat difficult Extremely dIfficult Somewhat difficult     Data saved with a previous flowsheet row definition    BP Readings from Last 3 Encounters:  09/29/24 120/84  06/15/24 122/74  03/15/24 118/84    Wt Readings from Last 3 Encounters:  09/29/24 211 lb (95.7 kg)  06/15/24 222 lb (100.7 kg)  05/03/24 236 lb (107 kg)    BP 120/84   Pulse 87   Temp 98.1 F (36.7 C)   Ht 5' 6 (1.676 m)   Wt 211 lb (95.7 kg)   SpO2 95%   BMI 34.06 kg/m   Physical Exam Vitals and nursing note reviewed.  Constitutional:      Appearance: Normal appearance. She is obese.  Cardiovascular:     Rate and Rhythm: Normal rate.  Pulmonary:     Effort: Pulmonary effort is normal.  Abdominal:     General: Abdomen is protuberant. There is no distension.  Musculoskeletal:        General: Normal range of motion.     Comments: Right shoulder pain reliably reproducible with internal and external rotation, as well as flexion above 90 degrees.  Skin:    General: Skin is warm and dry.  Neurological:     Mental Status: She is alert and oriented to person, place, and time.     Gait: Gait is intact.  Psychiatric:        Mood and Affect: Mood and affect normal.     Recent Labs     Component Value Date/Time   NA 143 06/19/2024 0808   K 3.2 (L) 06/19/2024 0808   CL 98 06/19/2024 0808   CO2 28 06/19/2024 0808   GLUCOSE 115 (H) 06/19/2024 0808   BUN 4 (L) 06/19/2024 0808   CREATININE 0.85 06/19/2024 0808   CALCIUM  8.9 06/19/2024 0808   PROT 6.3 06/19/2024 0808   ALBUMIN 3.8 (L) 06/19/2024 0808   AST 12 06/19/2024 0808   ALT 9 06/19/2024 9191  ALKPHOS 123 (H) 06/19/2024 0808    BILITOT 0.3 06/19/2024 0808    Lab Results  Component Value Date   WBC 6.7 06/19/2024   HGB 14.1 06/19/2024   HCT 42.8 06/19/2024   MCV 93 06/19/2024   PLT 215 06/19/2024   Lab Results  Component Value Date   HGBA1C 5.7 (H) 11/24/2023   HGBA1C 5.5 12/20/2014   Lab Results  Component Value Date   CHOL 152 06/19/2024   HDL 55 06/19/2024   LDLCALC 82 06/19/2024   TRIG 80 06/19/2024   CHOLHDL 2.8 06/19/2024   Lab Results  Component Value Date   TSH 1.300 06/19/2024      Assessment and Plan:  Severe episode of recurrent major depressive disorder, without psychotic features (HCC) Assessment & Plan: Apathy and amotivation seem to be predominating symptoms at present.  Switching bupropion  to Auvelity , sample given today.  Short interval follow-up in 2 weeks  Orders: -     Auvelity ; Take 1 tablet by mouth in the morning and at bedtime.  Dispense: 30 tablet  COPD with chronic bronchitis (HCC) Assessment & Plan: Switch AccuNeb  to DuoNeb, as needed use.  Encouraged smoking cessation  Orders: -     Ipratropium-Albuterol ; Take 3 mLs by nebulization every 6 (six) hours as needed.  Dispense: 360 mL; Refill: 1  Chronic bilateral low back pain without sciatica Assessment & Plan: Try celecoxib  once daily  Orders: -     Celecoxib ; Take 1 capsule (200 mg total) by mouth daily as needed (for joint pain). Take with food.  Do not take with other NSAIDs (ibuprofen, naproxen, aspirin, etc.)  Dispense: 30 capsule; Refill: 1  Chronic right shoulder pain Assessment & Plan: Tried celecoxib  once daily   Encounter for immunization -     Flu vaccine trivalent PF, 6mos and older(Flulaval,Afluria,Fluarix,Fluzone)     Short interval follow-up in 2 weeks for depression and back pain   Rolan Hoyle, PA-C, DMSc, Nutritionist [facility] Primary Care and Sports [facility] [phone]

## 2024-09-29 NOTE — Assessment & Plan Note (Signed)
 Apathy and amotivation seem to be predominating symptoms at present.  Switching bupropion  to Auvelity , sample given today.  Short interval follow-up in 2 weeks

## 2024-09-29 NOTE — Assessment & Plan Note (Signed)
 Try celecoxib  once daily

## 2024-09-29 NOTE — Assessment & Plan Note (Signed)
 Tried celecoxib  once daily

## 2024-10-02 NOTE — Telephone Encounter (Signed)
 Duplicate request.  Requested Prescriptions  Pending Prescriptions Disp Refills   ipratropium-albuterol  (DUONEB) 0.5-2.5 (3) MG/3ML SOLN [Pharmacy Med Name: IPRATROPI/ALB 0.5/3MG  INH SOLUTION]      Sig: TAKE BY NEBULIZATION EVERY 6 HOURS AS NEEDED     Pulmonology:  Combination Products - albuterol  / ipratropium Passed - 10/02/2024 11:17 AM      Passed - Last BP in normal range    BP Readings from Last 1 Encounters:  09/29/24 120/84         Passed - Last Heart Rate in normal range    Pulse Readings from Last 1 Encounters:  09/29/24 87         Passed - Valid encounter within last 12 months    Recent Outpatient Visits           3 days ago Severe episode of recurrent major depressive disorder, without psychotic features (HCC)   [place] [facility] Medicine at [facility], [place], [place]   3 months ago Severe episode of recurrent major depressive disorder, without psychotic features (HCC)   [place] Primary Care & Sports Medicine at [facility], [place], [place]   6 months ago Chronic bilateral low back pain without sciatica   [facility] at [facility], [place], [place]   7 months ago Intertrigo   [facility] Primary Care & Sports Medicine at [facility], [place] P, [place]               celecoxib  (CELEBREX ) 200 MG capsule [Pharmacy Med Name: CELECOXIB  200MG  CAPSULES] 90 capsule     Sig: TAKE 1 CAPSULE(200 MG) BY MOUTH DAILY WITH FOOD AS NEEDED FOR JOINT PAIN. DO NOT TAKE WITH OTHER NSAIDS IBUPROFEN, NAPROXEN, ASPIRIN, ETC     Analgesics:  COX2 Inhibitors Failed - 10/02/2024 11:17 AM      Failed - Manual Review: Labs are only required if the patient has taken medication for more than 8 weeks.      Passed - HGB in normal range and within 360 days    Hemoglobin  Date Value Ref Range Status  06/19/2024 14.1 11.1 - 15.9 g/dL Final         Passed - Cr in normal range and within 360 days     Creatinine, Ser  Date Value Ref Range Status  06/19/2024 0.85 0.57 - 1.00 mg/dL Final         Passed - HCT in normal range and within 360 days    Hematocrit  Date Value Ref Range Status  06/19/2024 42.8 34.0 - 46.6 % Final         Passed - AST in normal range and within 360 days    AST  Date Value Ref Range Status  06/19/2024 12 0 - 40 IU/L Final         Passed - ALT in normal range and within 360 days    ALT  Date Value Ref Range Status  06/19/2024 9 0 - 32 IU/L Final         Passed - eGFR is 30 or above and within 360 days    eGFR  Date Value Ref Range Status  06/19/2024 77 >59 mL/min/1.73 Final         Passed - Patient is not pregnant      Passed - Valid encounter within last 12 months    Recent Outpatient Visits  3 days ago Severe episode of recurrent major depressive disorder, without psychotic features [facility])   [place] Primary Care & Sports Medicine at [facility], Toribio SQU[place]B, [place]   3 months ago Severe episode of recurrent major depressive disorder, without psychotic features [facility])   [facility] Primary Care & Sports Medicine at [facility], [place], [place]   6 months ago Chronic bilateral low back pain without sciatica   [facility] Primary Care & Sports Medicine at [facility], [place]   7 months ago [facility] Primary Care & Sports Medicine at [facility], Toribio SQU[place]B, [place]

## 2024-10-09 ENCOUNTER — Other Ambulatory Visit: Payer: Self-pay | Admitting: Physician Assistant

## 2024-10-12 ENCOUNTER — Other Ambulatory Visit: Payer: Self-pay

## 2024-10-12 NOTE — Telephone Encounter (Signed)
 Requested medication (s) are due for refill today - unsure  Requested medication (s) are on the active medication list -yes  Future visit scheduled -yes  Last refill: 08/15/24 #15 2RF  Notes to clinic: long term use- requires review   Requested Prescriptions  Pending Prescriptions Disp Refills   lidocaine  (LIDODERM ) 5 % [Pharmacy Med Name: LIDOCAINE  5% PATCH] 15 patch 2    Sig: UNWRAP AND APPLY 1 PATCH TO SKIN DAILY. REMOVE AND DISCARD PATCH WITHIN 12 HOURS OR AS DIRECTED.     Analgesics:  Topicals Failed - 10/12/2024 10:31 AM      Failed - Manual Review: Labs are only required if the patient has taken medication for more than 8 weeks.      Passed - PLT in normal range and within 360 days    Platelets  Date Value Ref Range Status  06/19/2024 215 150 - 450 x10E3/uL Final         Passed - HGB in normal range and within 360 days    Hemoglobin  Date Value Ref Range Status  06/19/2024 14.1 11.1 - 15.9 g/dL Final         Passed - HCT in normal range and within 360 days    Hematocrit  Date Value Ref Range Status  06/19/2024 42.8 34.0 - 46.6 % Final         Passed - Cr in normal range and within 360 days    Creatinine, Ser  Date Value Ref Range Status  06/19/2024 0.85 0.57 - 1.00 mg/dL Final         Passed - eGFR is 30 or above and within 360 days    eGFR  Date Value Ref Range Status  06/19/2024 77 >59 mL/min/1.73 Final         Passed - Patient is not pregnant      Passed - Valid encounter within last 12 months    Recent Outpatient Visits           1 week ago Severe episode of recurrent major depressive disorder, without psychotic features (HCC)   [place] [facility] & [facility] Medicine at [facility], Tor[place], [place]   3 months ago Severe episode of recurrent major depressive disorder, without psychotic features (HCC)   [place] [facility] Care & Sports Medicine at [facility], Toribio SQU[place], [place]   7 months ago Chronic bilateral low back pain  without sciatica   [facility] Primary Care & Sports Medicine at [facility], [place], [place]   8 months ago Intertrigo   [facility] Primary Care & Sports Medicine at [facility], Toribio SQUIBB, [place]                 Requested Prescriptions  Pending Prescriptions Disp Refills   lidocaine  (LIDODERM ) 5 % [Pharmacy Med Name: LIDOCAINE  5% PATCH] 15 patch 2    Sig: UNWRAP AND APPLY 1 PATCH TO SKIN DAILY. REMOVE AND DISCARD PATCH WITHIN 12 HOURS OR AS DIRECTED.     Analgesics:  Topicals Failed - 10/12/2024 10:31 AM      Failed - Manual Review: Labs are only required if the patient has taken medication for more than 8 weeks.      Passed - PLT in normal range and within 360 days    Platelets  Date Value Ref Range Status  06/19/2024 215 150 - 450 x10E3/uL Final         Passed - HGB in normal range and  within 360 days    Hemoglobin  Date Value Ref Range Status  06/19/2024 14.1 11.1 - 15.9 g/dL Final         Passed - HCT in normal range and within 360 days    Hematocrit  Date Value Ref Range Status  06/19/2024 42.8 34.0 - 46.6 % Final         Passed - Cr in normal range and within 360 days    Creatinine, Ser  Date Value Ref Range Status  06/19/2024 0.85 0.57 - 1.00 mg/dL Final         Passed - eGFR is 30 or above and within 360 days    eGFR  Date Value Ref Range Status  06/19/2024 77 >59 mL/min/1.73 Final         Passed - Patient is not pregnant      Passed - Valid encounter within last 12 months    Recent Outpatient Visits           1 week ago Severe episode of recurrent major depressive disorder, without psychotic features (HCC)   [place] [facility] Care & [facility] Medicine at [facility], [place]   3 months ago Severe episode of recurrent major depressive disorder, without psychotic features [facility])   [place] Primary Care & Sports Medicine at [facility], [place], [place]   7 months ago Chronic bilateral low back pain  without sciatica   [facility] Primary Care & Sports Medicine at [facility], [place], [place]   8 months ago [facility] Primary Care & Sports Medicine at [facility], Torib[place] SQUIBB, [place]

## 2024-10-16 ENCOUNTER — Ambulatory Visit: Admitting: Physician Assistant

## 2024-10-19 ENCOUNTER — Ambulatory Visit: Admitting: Physician Assistant

## 2024-10-28 ENCOUNTER — Other Ambulatory Visit: Payer: Self-pay | Admitting: Physician Assistant

## 2024-10-28 DIAGNOSIS — G8929 Other chronic pain: Secondary | ICD-10-CM

## 2024-10-31 ENCOUNTER — Other Ambulatory Visit: Payer: Self-pay | Admitting: Physician Assistant

## 2024-10-31 DIAGNOSIS — F331 Major depressive disorder, recurrent, moderate: Secondary | ICD-10-CM

## 2024-11-01 ENCOUNTER — Other Ambulatory Visit: Payer: Self-pay | Admitting: Physician Assistant

## 2024-11-01 NOTE — Telephone Encounter (Signed)
 Requested Prescriptions  Pending Prescriptions Disp Refills   citalopram  (CELEXA ) 40 MG tablet [Pharmacy Med Name: CITALOPRAM  40MG  TABLETS] 90 tablet 0    Sig: TAKE 1 TABLET(40 MG) BY MOUTH DAILY     Psychiatry:  Antidepressants - SSRI Passed - 11/01/2024  3:56 PM      Passed - Completed PHQ-2 or PHQ-9 in the last 360 days      Passed - Valid encounter within last 6 months    Recent Outpatient Visits           1 month ago Severe episode of recurrent major depressive disorder, without psychotic features (HCC)    Primary Care & Sports Medicine at [facility], [place]   4 months ago Severe episode of recurrent major depressive disorder, without psychotic features [facility])   [facility] Care [facility] Medicine at [facility], [place]   7 months ago Chronic bilateral low back pain without sciatica   [facility] Primary Care & [facility] Medicine at [facility], [place], [place]   8 months ago Intertrigo   [facility] Primary Care & Sports Medicine at [facility], Toribio SQUIBB, [place]

## 2024-11-01 NOTE — Telephone Encounter (Signed)
 Requested by interface surescripts. Future visit 11/15/24.  Requested Prescriptions  Pending Prescriptions Disp Refills   celecoxib  (CELEBREX ) 200 MG capsule [Pharmacy Med Name: CELECOXIB  200MG  CAPSULES] 30 capsule 0    Sig: TAKE 1 CAPSULE(200 MG) BY MOUTH DAILY WITH FOOD AS NEEDED FOR JOINT PAIN. DO NOT TAKE WITH OTHER NSAIDS IBUPROFEN, NAPROXEN, ASPIRIN, ETC     Analgesics:  COX2 Inhibitors Failed - 11/01/2024  9:44 AM      Failed - Manual Review: Labs are only required if the patient has taken medication for more than 8 weeks.      Passed - HGB in normal range and within 360 days    Hemoglobin  Date Value Ref Range Status  06/19/2024 14.1 11.1 - 15.9 g/dL Final         Passed - Cr in normal range and within 360 days    Creatinine, Ser  Date Value Ref Range Status  06/19/2024 0.85 0.57 - 1.00 mg/dL Final         Passed - HCT in normal range and within 360 days    Hematocrit  Date Value Ref Range Status  06/19/2024 42.8 34.0 - 46.6 % Final         Passed - AST in normal range and within 360 days    AST  Date Value Ref Range Status  06/19/2024 12 0 - 40 IU/L Final         Passed - ALT in normal range and within 360 days    ALT  Date Value Ref Range Status  06/19/2024 9 0 - 32 IU/L Final         Passed - eGFR is 30 or above and within 360 days    eGFR  Date Value Ref Range Status  06/19/2024 77 >59 mL/min/1.73 Final         Passed - Patient is not pregnant      Passed - Valid encounter within last 12 months    Recent Outpatient Visits           1 month ago Severe episode of recurrent major depressive disorder, without psychotic features (HCC)   [place] [facility] Care [facility] Medicine at [facility], [place]   4 months ago Severe episode of recurrent major depressive disorder, without psychotic features [facility])   [place] [facility] Care [facility] Medicine at [facility], [place]   7 months ago Chronic bilateral low back pain  without sciatica   [facility] Primary Care [facility] Medicine at [facility], [place]   8 months ago [facility] Primary Care & Sports Medicine at [facility], Torib[place] SQU[place]B, [place]

## 2024-11-03 NOTE — Telephone Encounter (Signed)
 Requested Prescriptions  Pending Prescriptions Disp Refills   lidocaine  (LIDODERM ) 5 % [Pharmacy Med Name: LIDOCAINE  5% PATCH] 15 patch 0    Sig: UNWRAP AND APPLY 1 PATCH TO SKIN DAILY. REMOVE AND DISCARD PATCH WITHIN 12 HOURS OR AS DIRECTED.     Analgesics:  Topicals Failed - 11/03/2024 10:30 AM      Failed - Manual Review: Labs are only required if the patient has taken medication for more than 8 weeks.      Passed - PLT in normal range and within 360 days    Platelets  Date Value Ref Range Status  06/19/2024 215 150 - 450 x10E3/uL Final         Passed - HGB in normal range and within 360 days    Hemoglobin  Date Value Ref Range Status  06/19/2024 14.1 11.1 - 15.9 g/dL Final         Passed - HCT in normal range and within 360 days    Hematocrit  Date Value Ref Range Status  06/19/2024 42.8 34.0 - 46.6 % Final         Passed - Cr in normal range and within 360 days    Creatinine, Ser  Date Value Ref Range Status  06/19/2024 0.85 0.57 - 1.00 mg/dL Final         Passed - eGFR is 30 or above and within 360 days    eGFR  Date Value Ref Range Status  06/19/2024 77 >59 mL/min/1.73 Final         Passed - Patient is not pregnant      Passed - Valid encounter within last 12 months    Recent Outpatient Visits           1 month ago Severe episode of recurrent major depressive disorder, without psychotic features (HCC)   [place] [facility] at [facility], [place]   4 months ago Severe episode of recurrent major depressive disorder, without psychotic features [facility])   [place] [facility] at [facility], [place]   7 months ago Chronic bilateral low back pain without sciatica   [facility] at [facility], [place]   8 months ago [facility] at [facility], Toribio SQU[place]B, [place]

## 2024-11-05 ENCOUNTER — Other Ambulatory Visit: Payer: Self-pay | Admitting: Physician Assistant

## 2024-11-05 DIAGNOSIS — J4489 Other specified chronic obstructive pulmonary disease: Secondary | ICD-10-CM

## 2024-11-05 DIAGNOSIS — F331 Major depressive disorder, recurrent, moderate: Secondary | ICD-10-CM

## 2024-11-07 NOTE — Telephone Encounter (Signed)
 Duplicate request.  Requested Prescriptions  Pending Prescriptions Disp Refills   citalopram  (CELEXA ) 40 MG tablet [Pharmacy Med Name: CITALOPRAM  40MG  TABLETS] 90 tablet 0    Sig: TAKE 1 TABLET(40 MG) BY MOUTH DAILY     Psychiatry:  Antidepressants - SSRI Passed - 11/07/2024 11:28 AM      Passed - Completed PHQ-2 or PHQ-9 in the last 360 days      Passed - Valid encounter within last 6 months    Recent Outpatient Visits           1 month ago Severe episode of recurrent major depressive disorder, without psychotic features (HCC)   [place] [facility] at [facility], [place]   4 months ago Severe episode of recurrent major depressive disorder, without psychotic features [facility])   [place] [facility] at [facility], [place]   7 months ago Chronic bilateral low back pain without sciatica   [facility] at [facility], [place]   9 months ago Intertrigo   [facility] at [facility], [place] P, [place]               ipratropium-albuterol  (DUONEB) 0.5-2.5 (3) MG/3ML LARRAINE [facility] Med Name: IPRATROPI/ALB 0.5/3MG  INH SOLUTION]      Sig: TAKE BY NEBULIZATION EVERY 6 HOURS AS NEEDED     Pulmonology:  Combination Products - albuterol  / ipratropium Passed - 11/07/2024 11:28 AM      Passed - Last BP in normal range    BP Readings from Last 1 Encounters:  09/29/24 120/84         Passed - Last Heart Rate in normal range    Pulse Readings from Last 1 Encounters:  09/29/24 87         Passed - Valid encounter within last 12 months    Recent Outpatient Visits           1 month ago Severe episode of recurrent major depressive disorder, without psychotic features (HCC)   [place] [facility] at [facility], [place]   4 months ago Severe episode of recurrent major depressive  disorder, without psychotic features [facility])   [facility] at [facility], [place]   7 months ago Chronic bilateral low back pain without sciatica   [facility] at [facility], [place]   9 months ago Intertrigo   [facility] at [facility], Toribio SQU[place]B, [place]

## 2024-11-15 ENCOUNTER — Ambulatory Visit: Admitting: Physician Assistant

## 2024-11-16 ENCOUNTER — Other Ambulatory Visit: Payer: Self-pay | Admitting: Physician Assistant

## 2024-11-16 NOTE — Telephone Encounter (Signed)
 Requested Prescriptions  Pending Prescriptions Disp Refills   lidocaine  (LIDODERM ) 5 % [Pharmacy Med Name: LIDOCAINE  5% PATCH] 15 patch 0    Sig: UNWRAP AND APPLY 1 PATCH TO SKIN DAILY. REMOVE AND DISCARD PATCH WITHIN 12 HOURS OR AS DIRECTED.     Analgesics:  Topicals Failed - 11/16/2024  3:46 PM      Failed - Manual Review: Labs are only required if the patient has taken medication for more than 8 weeks.      Passed - PLT in normal range and within 360 days    Platelets  Date Value Ref Range Status  06/19/2024 215 150 - 450 x10E3/uL Final         Passed - HGB in normal range and within 360 days    Hemoglobin  Date Value Ref Range Status  06/19/2024 14.1 11.1 - 15.9 g/dL Final         Passed - HCT in normal range and within 360 days    Hematocrit  Date Value Ref Range Status  06/19/2024 42.8 34.0 - 46.6 % Final         Passed - Cr in normal range and within 360 days    Creatinine, Ser  Date Value Ref Range Status  06/19/2024 0.85 0.57 - 1.00 mg/dL Final         Passed - eGFR is 30 or above and within 360 days    eGFR  Date Value Ref Range Status  06/19/2024 77 >59 mL/min/1.73 Final         Passed - Patient is not pregnant      Passed - Valid encounter within last 12 months    Recent Outpatient Visits           1 month ago Severe episode of recurrent major depressive disorder, without psychotic features (HCC)   [place] [facility] at [facility], [place]   5 months ago Severe episode of recurrent major depressive disorder, without psychotic features [facility])   [place] [facility] at [facility], [place]   8 months ago Chronic bilateral low back pain without sciatica   [facility] at [facility], [place]   9 months ago [facility]   [facility] Medicine at [facility], Toribio SQUIBB, [place]

## 2024-12-08 ENCOUNTER — Other Ambulatory Visit: Payer: Self-pay | Admitting: Physician Assistant

## 2024-12-08 DIAGNOSIS — J449 Chronic obstructive pulmonary disease, unspecified: Secondary | ICD-10-CM

## 2024-12-08 NOTE — Telephone Encounter (Signed)
 Requested Prescriptions  Refused Prescriptions Disp Refills   albuterol  (VENTOLIN  HFA) 108 (90 Base) MCG/ACT inhaler [Pharmacy Med Name: ALBUTEROL  HFA INH (200 PUFFS) 8.5GM] 25.5 g     Sig: INHALE 2 PUFFS INTO THE LUNGS EVERY 6 HOURS AS NEEDED FOR WHEEZING OR SHORTNESS OF BREATH     Pulmonology:  Beta Agonists 2 Passed - 12/08/2024  4:24 PM      Passed - Last BP in normal range    BP Readings from Last 1 Encounters:  09/29/24 120/84         Passed - Last Heart Rate in normal range    Pulse Readings from Last 1 Encounters:  09/29/24 87         Passed - Valid encounter within last 12 months    Recent Outpatient Visits           2 months ago Severe episode of recurrent major depressive disorder, without psychotic features (HCC)   [place] [facility] at [facility], [place]   5 months ago Severe episode of recurrent major depressive disorder, without psychotic features [facility])   [facility] at [facility], [place]   8 months ago Chronic bilateral low back pain without sciatica   [facility] at [facility], [place]   10 months ago [facility] at [facility], Tor[place] SQU[place], [place]

## 2025-05-09 ENCOUNTER — Ambulatory Visit
